# Patient Record
Sex: Male | Born: 1955 | Hispanic: No | Marital: Single | Smoking: Never smoker
Health system: Southern US, Community
[De-identification: ages and names within clinical notes are randomized; demographics above are authoritative.]

## PROBLEM LIST (undated history)

## (undated) DIAGNOSIS — F32A Depression, unspecified: Secondary | ICD-10-CM

## (undated) DIAGNOSIS — G43909 Migraine, unspecified, not intractable, without status migrainosus: Secondary | ICD-10-CM

## (undated) DIAGNOSIS — F411 Generalized anxiety disorder: Secondary | ICD-10-CM

## (undated) DIAGNOSIS — M1990 Unspecified osteoarthritis, unspecified site: Secondary | ICD-10-CM

## (undated) DIAGNOSIS — M542 Cervicalgia: Secondary | ICD-10-CM

## (undated) DIAGNOSIS — F329 Major depressive disorder, single episode, unspecified: Secondary | ICD-10-CM

## (undated) DIAGNOSIS — K219 Gastro-esophageal reflux disease without esophagitis: Secondary | ICD-10-CM

## (undated) HISTORY — DX: Generalized anxiety disorder: F41.1

## (undated) HISTORY — DX: Cervicalgia: M54.2

## (undated) HISTORY — PX: TONSILLECTOMY: SUR1361

## (undated) HISTORY — DX: Major depressive disorder, single episode, unspecified: F32.9

## (undated) HISTORY — DX: Depression, unspecified: F32.A

---

## 1998-08-02 ENCOUNTER — Other Ambulatory Visit: Admission: RE | Admit: 1998-08-02 | Discharge: 1998-08-02 | Payer: Self-pay | Admitting: Otolaryngology

## 1999-04-18 ENCOUNTER — Ambulatory Visit (HOSPITAL_COMMUNITY): Admission: RE | Admit: 1999-04-18 | Discharge: 1999-04-18 | Payer: Self-pay | Admitting: Internal Medicine

## 1999-04-18 ENCOUNTER — Encounter: Payer: Self-pay | Admitting: Internal Medicine

## 1999-08-04 ENCOUNTER — Encounter: Payer: Self-pay | Admitting: Orthopedic Surgery

## 1999-08-10 ENCOUNTER — Inpatient Hospital Stay (HOSPITAL_COMMUNITY): Admission: RE | Admit: 1999-08-10 | Discharge: 1999-08-17 | Payer: Self-pay | Admitting: Orthopedic Surgery

## 1999-08-14 ENCOUNTER — Encounter: Payer: Self-pay | Admitting: Orthopedic Surgery

## 1999-09-21 ENCOUNTER — Emergency Department (HOSPITAL_COMMUNITY): Admission: EM | Admit: 1999-09-21 | Discharge: 1999-09-21 | Payer: Self-pay | Admitting: Emergency Medicine

## 2000-05-15 HISTORY — PX: OTHER SURGICAL HISTORY: SHX169

## 2003-03-26 ENCOUNTER — Ambulatory Visit (HOSPITAL_COMMUNITY): Admission: RE | Admit: 2003-03-26 | Discharge: 2003-03-26 | Payer: Self-pay | Admitting: General Surgery

## 2003-05-25 ENCOUNTER — Inpatient Hospital Stay (HOSPITAL_COMMUNITY): Admission: RE | Admit: 2003-05-25 | Discharge: 2003-05-29 | Payer: Self-pay | Admitting: Orthopedic Surgery

## 2004-04-11 ENCOUNTER — Ambulatory Visit: Payer: Self-pay | Admitting: Internal Medicine

## 2004-09-08 ENCOUNTER — Ambulatory Visit: Payer: Self-pay | Admitting: Internal Medicine

## 2005-05-15 HISTORY — PX: OTHER SURGICAL HISTORY: SHX169

## 2005-10-18 ENCOUNTER — Ambulatory Visit: Payer: Self-pay | Admitting: Internal Medicine

## 2012-08-19 ENCOUNTER — Telehealth: Payer: Self-pay | Admitting: Internal Medicine

## 2012-08-19 NOTE — Telephone Encounter (Signed)
Ok with me 

## 2012-08-19 NOTE — Telephone Encounter (Signed)
Patient was last seen in 2007 and he has moved back to the area and wants to re-establish care, please advise

## 2012-10-01 ENCOUNTER — Ambulatory Visit: Payer: Self-pay | Admitting: Internal Medicine

## 2012-12-17 ENCOUNTER — Ambulatory Visit: Payer: Self-pay | Admitting: Internal Medicine

## 2012-12-30 ENCOUNTER — Ambulatory Visit (INDEPENDENT_AMBULATORY_CARE_PROVIDER_SITE_OTHER): Payer: Self-pay | Admitting: Internal Medicine

## 2012-12-30 ENCOUNTER — Telehealth: Payer: Self-pay | Admitting: Internal Medicine

## 2012-12-30 ENCOUNTER — Encounter: Payer: Self-pay | Admitting: Internal Medicine

## 2012-12-30 VITALS — BP 112/90 | HR 103 | Temp 97.0°F | Ht 68.0 in | Wt 183.5 lb

## 2012-12-30 DIAGNOSIS — G4700 Insomnia, unspecified: Secondary | ICD-10-CM | POA: Insufficient documentation

## 2012-12-30 DIAGNOSIS — F411 Generalized anxiety disorder: Secondary | ICD-10-CM

## 2012-12-30 DIAGNOSIS — Z0000 Encounter for general adult medical examination without abnormal findings: Secondary | ICD-10-CM | POA: Insufficient documentation

## 2012-12-30 DIAGNOSIS — Z0001 Encounter for general adult medical examination with abnormal findings: Secondary | ICD-10-CM | POA: Insufficient documentation

## 2012-12-30 HISTORY — DX: Generalized anxiety disorder: F41.1

## 2012-12-30 MED ORDER — ZOLPIDEM TARTRATE 10 MG PO TABS: 10.0000 mg | ORAL_TABLET | Freq: Every evening | ORAL | Status: DC | PRN

## 2012-12-30 MED ORDER — ALPRAZOLAM 0.5 MG PO TABS: 0.2500 mg | ORAL_TABLET | Freq: Two times a day (BID) | ORAL | Status: DC | PRN

## 2012-12-30 MED ORDER — ALPRAZOLAM 1 MG PO TABS: ORAL_TABLET | ORAL | Status: DC

## 2012-12-30 NOTE — Telephone Encounter (Signed)
The patient is hoping to come in for a new pt apt before the next available opening (mid oct).  He states he is having anxiety. He has canceled two new pt apts previously.  Let me know if you want him added in this week. Thanks!

## 2012-12-30 NOTE — Assessment & Plan Note (Signed)
For med restart,  to f/u any worsening symptoms or concerns  

## 2012-12-30 NOTE — Assessment & Plan Note (Signed)
For ambien prn re-start,  to f/u any worsening symptoms or concerns

## 2012-12-30 NOTE — Telephone Encounter (Signed)
Ok with me, even today is ok

## 2012-12-30 NOTE — Progress Notes (Signed)
  Subjective:    Patient ID: Peter [name], male    DOB: 19-Nov-1955, 57 y.o.   MRN: [id number]  HPI   Here to f/u and re-establish, will have full health benefits in 2-3 mo, does not want extensive eval today due to cost;  Will have to have a third hip replacement as the second hip hardware has been required.  Unfortunately with this he has increased stress. 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  Was well controlled by FP in [facility] [place] [facility] with anxiety with xanax/ambien prn for sleep as well, had been taking 1 mg - 1/2 bid prn. Has tried relaxing excercises, long standing problem for many years.  Bikes 50-60 miles per wk. Peak wk was 205 about 1 yr ago, now working towards 175.  Almost married again a yr ago but didn't, has a new [place].  Denies worsening depressive symptoms, suicidal ideation, or panic. Has increased difficutly with sleep as well Past Medical History  Diagnosis Date  . Anxiety state, unspecified 12/30/2012  . Depression    Past Surgical History  Procedure Laterality Date  . Right hip tha  2002  . Right hip revision  2007    reports that he has quit smoking. He does not have any smokeless tobacco history on file. He reports that he does not drink alcohol or use illicit drugs. family history includes Alcoholism in an other family member; Arthritis in an other family member; Heart disease in an other family member; Hypertension in an other family member; Lung cancer in an other family member; Stroke in an other family member. Allergies  Allergen Reactions  . Demerol [Meperidine]     hallucinations   No current outpatient prescriptions on file prior to visit.   No current facility-administered medications on file prior to visit.   Review of Systems All otherwise neg per pt     Objective:   Physical Exam BP 112/90  Pulse 103  Temp(Src) 97 F (36.1 C) (Oral)  Ht 5\' 8"  (1.727  m)  Wt 183 lb 8 oz (83.235 kg)  BMI 27.91 kg/m2  SpO2 94% VS noted,  Constitutional: Pt appears well-developed and well-nourished.  HENT: Head: NCAT.  Right Ear: External ear normal.  Left Ear: External ear normal.  Eyes: Conjunctivae and EOM are normal. Pupils are equal, round, and reactive to light.  Neck: Normal range of motion. Neck supple.  Cardiovascular: Normal rate and regular rhythm.   Pulmonary/Chest: Effort normal and breath sounds normal.  Abd:  Soft, NT, non-distended, + BS Neurological: Pt is alert. Not confused  Skin: Skin is warm. No erythema.  Psychiatric: Pt behavior is normal. Thought content normal. 1-2+ nervous, not depressed affect    Assessment & Plan:

## 2012-12-30 NOTE — Telephone Encounter (Signed)
Appt today at 2:45.

## 2012-12-30 NOTE — Patient Instructions (Signed)
Please take all new medication as prescribed Please continue all other medications as before, and refills have been done if requested.  Please return in 3 months, or sooner if needed, with Lab testing done 3-5 days before

## 2013-03-20 ENCOUNTER — Other Ambulatory Visit: Payer: Self-pay

## 2013-04-01 ENCOUNTER — Ambulatory Visit: Payer: Self-pay | Admitting: Internal Medicine

## 2013-04-02 ENCOUNTER — Ambulatory Visit: Payer: Self-pay | Admitting: Internal Medicine

## 2013-04-13 ENCOUNTER — Other Ambulatory Visit: Payer: Self-pay | Admitting: Internal Medicine

## 2013-04-15 NOTE — Telephone Encounter (Signed)
Faxed hardcopy to [facility] [place]  

## 2013-04-15 NOTE — Telephone Encounter (Signed)
Done hardcopy to [facility]  

## 2013-05-21 ENCOUNTER — Ambulatory Visit: Payer: Self-pay | Admitting: Internal Medicine

## 2013-07-02 ENCOUNTER — Other Ambulatory Visit: Payer: Self-pay | Admitting: Internal Medicine

## 2013-07-02 ENCOUNTER — Ambulatory Visit: Payer: Self-pay | Admitting: Internal Medicine

## 2013-07-02 NOTE — Telephone Encounter (Signed)
Done hardcopy to [facility]  

## 2013-07-02 NOTE — Telephone Encounter (Signed)
Faxed hardcopy to [place] [place]

## 2013-07-03 ENCOUNTER — Ambulatory Visit: Payer: Self-pay | Admitting: Internal Medicine

## 2013-07-11 ENCOUNTER — Ambulatory Visit: Payer: Self-pay | Admitting: Internal Medicine

## 2013-07-16 ENCOUNTER — Emergency Department (HOSPITAL_COMMUNITY): Payer: Managed Care, Other (non HMO)

## 2013-07-16 ENCOUNTER — Emergency Department (HOSPITAL_COMMUNITY)
Admission: EM | Admit: 2013-07-16 | Discharge: 2013-07-16 | Disposition: A | Discharge status: Home / Self Care | Payer: Managed Care, Other (non HMO) | Attending: Emergency Medicine | Admitting: Emergency Medicine

## 2013-07-16 ENCOUNTER — Encounter (HOSPITAL_COMMUNITY): Payer: Self-pay | Admitting: Emergency Medicine

## 2013-07-16 DIAGNOSIS — Z87891 Personal history of nicotine dependence: Secondary | ICD-10-CM | POA: Insufficient documentation

## 2013-07-16 DIAGNOSIS — W010XXA Fall on same level from slipping, tripping and stumbling without subsequent striking against object, initial encounter: Secondary | ICD-10-CM | POA: Insufficient documentation

## 2013-07-16 DIAGNOSIS — M25552 Pain in left hip: Secondary | ICD-10-CM

## 2013-07-16 DIAGNOSIS — F411 Generalized anxiety disorder: Secondary | ICD-10-CM | POA: Insufficient documentation

## 2013-07-16 DIAGNOSIS — Y939 Activity, unspecified: Secondary | ICD-10-CM | POA: Insufficient documentation

## 2013-07-16 DIAGNOSIS — R0789 Other chest pain: Secondary | ICD-10-CM | POA: Insufficient documentation

## 2013-07-16 DIAGNOSIS — R079 Chest pain, unspecified: Secondary | ICD-10-CM

## 2013-07-16 DIAGNOSIS — Z79899 Other long term (current) drug therapy: Secondary | ICD-10-CM | POA: Insufficient documentation

## 2013-07-16 DIAGNOSIS — F3289 Other specified depressive episodes: Secondary | ICD-10-CM | POA: Insufficient documentation

## 2013-07-16 DIAGNOSIS — Z96649 Presence of unspecified artificial hip joint: Secondary | ICD-10-CM | POA: Insufficient documentation

## 2013-07-16 DIAGNOSIS — F329 Major depressive disorder, single episode, unspecified: Secondary | ICD-10-CM | POA: Insufficient documentation

## 2013-07-16 DIAGNOSIS — Z9889 Other specified postprocedural states: Secondary | ICD-10-CM | POA: Insufficient documentation

## 2013-07-16 DIAGNOSIS — F419 Anxiety disorder, unspecified: Secondary | ICD-10-CM

## 2013-07-16 DIAGNOSIS — Y929 Unspecified place or not applicable: Secondary | ICD-10-CM | POA: Insufficient documentation

## 2013-07-16 DIAGNOSIS — R112 Nausea with vomiting, unspecified: Secondary | ICD-10-CM | POA: Insufficient documentation

## 2013-07-16 LAB — URINALYSIS, ROUTINE W REFLEX MICROSCOPIC
BILIRUBIN URINE: NEGATIVE
GLUCOSE, UA: NEGATIVE mg/dL
HGB URINE DIPSTICK: NEGATIVE
KETONES UR: NEGATIVE mg/dL
LEUKOCYTES UA: NEGATIVE
Nitrite: NEGATIVE
PH: 7.5 (ref 5.0–8.0)
Protein, ur: NEGATIVE mg/dL
Specific Gravity, Urine: 1.014 (ref 1.005–1.030)
Urobilinogen, UA: 0.2 mg/dL (ref 0.0–1.0)

## 2013-07-16 LAB — CBC WITH DIFFERENTIAL/PLATELET
BASOS PCT: 1 % (ref 0–1)
Basophils Absolute: 0 10*3/uL (ref 0.0–0.1)
EOS ABS: 0.1 10*3/uL (ref 0.0–0.7)
Eosinophils Relative: 1 % (ref 0–5)
HCT: 37.7 % — ABNORMAL LOW (ref 39.0–52.0)
HEMOGLOBIN: 12.6 g/dL — AB (ref 13.0–17.0)
Lymphocytes Relative: 21 % (ref 12–46)
Lymphs Abs: 1.5 10*3/uL (ref 0.7–4.0)
MCH: 28.8 pg (ref 26.0–34.0)
MCHC: 33.4 g/dL (ref 30.0–36.0)
MCV: 86.3 fL (ref 78.0–100.0)
Monocytes Absolute: 0.8 10*3/uL (ref 0.1–1.0)
Monocytes Relative: 11 % (ref 3–12)
NEUTROS ABS: 4.8 10*3/uL (ref 1.7–7.7)
NEUTROS PCT: 66 % (ref 43–77)
PLATELETS: 264 10*3/uL (ref 150–400)
RBC: 4.37 MIL/uL (ref 4.22–5.81)
RDW: 15.9 % — ABNORMAL HIGH (ref 11.5–15.5)
WBC: 7.2 10*3/uL (ref 4.0–10.5)

## 2013-07-16 LAB — COMPREHENSIVE METABOLIC PANEL
ALBUMIN: 3.7 g/dL (ref 3.5–5.2)
ALK PHOS: 83 U/L (ref 39–117)
ALT: 18 U/L (ref 0–53)
AST: 21 U/L (ref 0–37)
BUN: 18 mg/dL (ref 6–23)
CHLORIDE: 103 meq/L (ref 96–112)
CO2: 23 mEq/L (ref 19–32)
Calcium: 9.1 mg/dL (ref 8.4–10.5)
Creatinine, Ser: 0.78 mg/dL (ref 0.50–1.35)
GFR calc Af Amer: >90 mL/min (ref >90)
GFR calc non Af Amer: >90 mL/min (ref >90)
Glucose, Bld: 94 mg/dL (ref 70–99)
POTASSIUM: 4.5 meq/L (ref 3.7–5.3)
SODIUM: 140 meq/L (ref 137–147)
TOTAL PROTEIN: 6.7 g/dL (ref 6.0–8.3)

## 2013-07-16 LAB — I-STAT CHEM 8, ED
BUN: 18 mg/dL (ref 6–23)
Calcium, Ion: 1.24 mmol/L — ABNORMAL HIGH (ref 1.12–1.23)
Chloride: 103 mEq/L (ref 96–112)
Creatinine, Ser: 1 mg/dL (ref 0.50–1.35)
Glucose, Bld: 89 mg/dL (ref 70–99)
HCT: 38 % — ABNORMAL LOW (ref 39.0–52.0)
Hemoglobin: 12.9 g/dL — ABNORMAL LOW (ref 13.0–17.0)
POTASSIUM: 4.3 meq/L (ref 3.7–5.3)
SODIUM: 140 meq/L (ref 137–147)
TCO2: 25 mmol/L (ref 0–100)

## 2013-07-16 LAB — POC OCCULT BLOOD, ED: Fecal Occult Bld: NEGATIVE

## 2013-07-16 LAB — I-STAT TROPONIN, ED: TROPONIN I, POC: 0.01 ng/mL (ref 0.00–0.08)

## 2013-07-16 MED ORDER — METHOCARBAMOL 500 MG PO TABS: ORAL_TABLET | ORAL | Status: DC

## 2013-07-16 MED ORDER — OMEPRAZOLE 20 MG PO CPDR: DELAYED_RELEASE_CAPSULE | ORAL | Status: DC

## 2013-07-16 MED ORDER — HYDROCODONE-ACETAMINOPHEN 5-325 MG PO TABS
1.0000 | ORAL_TABLET | Freq: Once | ORAL | Status: AC
  Administered 2013-07-16: 1 via ORAL
  Filled 2013-07-16: qty 1

## 2013-07-16 MED ORDER — METHOCARBAMOL 500 MG PO TABS
1000.0000 mg | ORAL_TABLET | Freq: Once | ORAL | Status: AC
  Administered 2013-07-16: 1000 mg via ORAL
  Filled 2013-07-16: qty 2

## 2013-07-16 MED ORDER — HYDROCODONE-ACETAMINOPHEN 5-325 MG PO TABS: ORAL_TABLET | ORAL | Status: DC

## 2013-07-16 NOTE — ED Notes (Signed)
Pt started having chest pain radiating down left arm with N/V. No diaphoresis. Chest pain 10/10 intermittent for 2 days- heaviness on chest. Pt thinks he may have had anxiety attack from hip pain. Pt had R hip replacement 10 years ago- pt slipped on ice 4 days ago and has had terrible pain in hip since (pain rating 7/10). CP now 5/10. EMS gave 325mg  ASA, 2 Nitro, 6 mg morphine. Pt states he has been taking 6-7 tablets of naproxen for several days. 136/96, HR 78, Resp 18, 02 99% on RA. EKG showed SR

## 2013-07-16 NOTE — Discharge Instructions (Signed)
Try ice/ heat to your hip. Take the medications as prescribed. DO NOT TAKE ANY MORE THAN ALEVE 2 TABLETS TWICE A DAY! Start the prilosec to prevent a stomach ulcer from developing.  Keep your appointment tomorrow with Dr Jenny Reichmann. Call Dr Damita Dunnings office to have him recheck your hip replacement soon. Return to the ED if you start vomiting blood, have black bowel movements or you start having abdominal pain.

## 2013-07-16 NOTE — ED Provider Notes (Signed)
CSN: [id number]     Arrival date & time 07/16/13  30 History   First MD Initiated Contact with Patient 07/16/13 1052     Chief Complaint  Patient presents with  . Chest Pain     (Consider location/radiation/quality/duration/timing/severity/associated sxs/prior Treatment) HPI Patient reports the emergency room. He states this morning he felt lightheaded. After he got to work he started having a chest heaviness which he has had off and on for the past few days. He said he had a lightening bolt type pain in his left petrous muscle that went down his left arm. He states 4 days ago he slipped and he has hurt his right hip. He states his original hip replacement was in 2001 and he had a revision in 2005. However they're revision was one of the recalled hips. 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 He states sometimes when he moves his leg a certain way he feels like the hip joint is slipping. He states he has been having worsening pain since she slipped and he has been taking Naprosyn 2 tablets up to 6 times a day. He states his left hip as a "10" and pain. He states he feels tired and fatigued. He does not have abdominal pain. He states his bowel movements are not black. He had nausea with vomiting twice without blood. EMS gave patient 325 mg aspirin, 2 nitroglycerin, and 6 mg of morphine. He states his chest pain that went from a 10 to a 6, his hip went from a 10-7.  PCP Dr Jenny Reichmann, has an appointment tomorrow Orthopedist Dr Mayer Camel  Past Medical History  Diagnosis Date  . Anxiety state, unspecified 12/30/2012  . Depression    Past Surgical History  Procedure Laterality Date  . Right hip tha  2002  . Right hip revision  2007   Family History  Problem Relation Age of Onset  . Alcoholism    . Arthritis    . Lung cancer    . Heart disease    . Stroke    . Hypertension     History  Substance Use Topics  . Smoking status: Former Research scientist (life sciences)  . Smokeless tobacco: Not on file  . Alcohol  Use: Yes  employed new job for 3 months  Review of Systems  All other systems reviewed and are negative.      Allergies  Demerol  Home Medications   Current Outpatient Rx  Name  Route  Sig  Dispense  Refill  . ALPRAZolam (XANAX) 1 MG tablet   Oral   Take 0.5 mg by mouth at bedtime as needed for anxiety.         . naproxen sodium (ANAPROX) 220 MG tablet   Oral   Take 440 mg by mouth daily as needed (for pain).         Marland Kitchen zolpidem (AMBIEN) 10 MG tablet   Oral   Take 5 mg by mouth daily as needed for sleep.          BP 117/90  Pulse 70  Resp 19  SpO2 95%  Vital signs normal   Physical Exam  Nursing note and vitals reviewed. Constitutional: He is oriented to person, place, and time. He appears well-developed and well-nourished.  Non-toxic appearance. He does not appear ill. No distress.  HENT:  Head: Normocephalic and atraumatic.  Right Ear: External ear normal.  Left Ear: External ear normal.  Nose: Nose normal. No mucosal edema or rhinorrhea.  Mouth/Throat: Oropharynx  is clear and moist and mucous membranes are normal. No dental abscesses or uvula swelling.  Eyes: Conjunctivae and EOM are normal. Pupils are equal, round, and reactive to light.  Neck: Normal range of motion and full passive range of motion without pain. Neck supple.  Cardiovascular: Normal rate, regular rhythm and normal heart sounds.  Exam reveals no gallop and no friction rub.   No murmur heard. Pulmonary/Chest: Effort normal and breath sounds normal. No respiratory distress. He has no wheezes. He has no rhonchi. He has no rales. He exhibits no tenderness and no crepitus.  Abdominal: Soft. Normal appearance and bowel sounds are normal. He exhibits no distension. There is no tenderness. There is no rebound and no guarding.  Musculoskeletal: Normal range of motion. He exhibits no edema and no tenderness.  Moves all extremities well with pain in his left hip joint on ROM  Neurological: He is  alert and oriented to person, place, and time. He has normal strength. No cranial nerve deficit.  Skin: Skin is warm, dry and intact. No rash noted. No erythema. No pallor.  Psychiatric: His speech is normal and behavior is normal. His mood appears anxious.    ED Course  Procedures (including critical care time)  Medications  methocarbamol (ROBAXIN) tablet 1,000 mg (1,000 mg Oral Given 07/16/13 1521)  HYDROcodone-acetaminophen (NORCO/VICODIN) 5-325 MG per tablet 1 tablet (1 tablet Oral Given 07/16/13 1521)   Patient's brother is in the ER. He states the patient has a lot of anxiety. Patient does admit he has anxiety because he is contemplating a third hip replacement. He also has started a new job.  Discussed with patient about not taking so much aleve.He is at risk for renal failure or ulcer/GI bleeding.    Labs Review Results for orders placed during the hospital encounter of 07/16/13  CBC WITH DIFFERENTIAL      Result Value Ref Range   WBC 7.2  4.0 - 10.5 K/uL   RBC 4.37  4.22 - 5.81 MIL/uL   Hemoglobin 12.6 (*) 13.0 - 17.0 g/dL   HCT 37.7 (*) 39.0 - 52.0 %   MCV 86.3  78.0 - 100.0 fL   MCH 28.8  26.0 - 34.0 pg   MCHC 33.4  30.0 - 36.0 g/dL   RDW 15.9 (*) 11.5 - 15.5 %   Platelets 264  150 - 400 K/uL   Neutrophils Relative % 66  43 - 77 %   Neutro Abs 4.8  1.7 - 7.7 K/uL   Lymphocytes Relative 21  12 - 46 %   Lymphs Abs 1.5  0.7 - 4.0 K/uL   Monocytes Relative 11  3 - 12 %   Monocytes Absolute 0.8  0.1 - 1.0 K/uL   Eosinophils Relative 1  0 - 5 %   Eosinophils Absolute 0.1  0.0 - 0.7 K/uL   Basophils Relative 1  0 - 1 %   Basophils Absolute 0.0  0.0 - 0.1 K/uL  COMPREHENSIVE METABOLIC PANEL      Result Value Ref Range   Sodium 140  137 - 147 mEq/L   Potassium 4.5  3.7 - 5.3 mEq/L   Chloride 103  96 - 112 mEq/L   CO2 23  19 - 32 mEq/L   Glucose, Bld 94  70 - 99 mg/dL   BUN 18  6 - 23 mg/dL   Creatinine, Ser 0.78  0.50 - 1.35 mg/dL   Calcium 9.1  8.4 - 10.5 mg/dL    Total Protein  6.7  6.0 - 8.3 g/dL   Albumin 3.7  3.5 - 5.2 g/dL   AST 21  0 - 37 U/L   ALT 18  0 - 53 U/L   Alkaline Phosphatase 83  39 - 117 U/L   Total Bilirubin <0.2 (*) 0.3 - 1.2 mg/dL   GFR calc non Af Amer >90  >90 mL/min   GFR calc Af Amer >90  >90 mL/min  URINALYSIS, ROUTINE W REFLEX MICROSCOPIC      Result Value Ref Range   Color, Urine YELLOW  YELLOW   APPearance CLEAR  CLEAR   Specific Gravity, Urine 1.014  1.005 - 1.030   pH 7.5  5.0 - 8.0   Glucose, UA NEGATIVE  NEGATIVE mg/dL   Hgb urine dipstick NEGATIVE  NEGATIVE   Bilirubin Urine NEGATIVE  NEGATIVE   Ketones, ur NEGATIVE  NEGATIVE mg/dL   Protein, ur NEGATIVE  NEGATIVE mg/dL   Urobilinogen, UA 0.2  0.0 - 1.0 mg/dL   Nitrite NEGATIVE  NEGATIVE   Leukocytes, UA NEGATIVE  NEGATIVE  I-STAT TROPOININ, ED      Result Value Ref Range   Troponin i, poc 0.01  0.00 - 0.08 ng/mL   Comment 3           POC OCCULT BLOOD, ED      Result Value Ref Range   Fecal Occult Bld NEGATIVE  NEGATIVE  I-STAT CHEM 8, ED      Result Value Ref Range   Sodium 140  137 - 147 mEq/L   Potassium 4.3  3.7 - 5.3 mEq/L   Chloride 103  96 - 112 mEq/L   BUN 18  6 - 23 mg/dL   Creatinine, Ser 1.00  0.50 - 1.35 mg/dL   Glucose, Bld 89  70 - 99 mg/dL   Calcium, Ion 1.24 (*) 1.12 - 1.23 mmol/L   TCO2 25  0 - 100 mmol/L   Hemoglobin 12.9 (*) 13.0 - 17.0 g/dL   HCT 38.0 (*) 39.0 - 52.0 %    Laboratory interpretation all normal except mild anemia    Imaging Review Dg Chest 2 View  07/16/2013   CLINICAL DATA:  Chest pain post fall  EXAM: CHEST  2 VIEW  COMPARISON:  None.  FINDINGS: Cardiomediastinal silhouette is unremarkable. No acute infiltrate or pleural effusion. No pulmonary edema. Mild degenerative changes thoracic spine. Mild thoracic dextroscoliosis.  IMPRESSION: No active cardiopulmonary disease.   Electronically Signed   By: Lahoma Crocker M.D.   On: 07/16/2013 12:12   Dg Hip Complete Right  07/16/2013   CLINICAL DATA:  Fall on ice,  worsening pain  EXAM: RIGHT HIP - COMPLETE 2+ VIEW  COMPARISON:  None.  FINDINGS: Three views of the right hip submitted. No acute fracture or subluxation. There is right hip prosthesis in anatomic alignment. No evidence of prosthesis loosening.  IMPRESSION: No acute fracture or subluxation. Right hip prosthesis in anatomic alignment. No evidence of prosthesis loosening.   Electronically Signed   By: Lahoma Crocker M.D.   On: 07/16/2013 12:10     EKG Interpretation   Date/Time:  Wednesday July 16 2013 10:57:24 EST Ventricular Rate:  70 PR Interval:  135 QRS Duration: 79 QT Interval:  364 QTC Calculation: 393 R Axis:   44 Text Interpretation:  Sinus rhythm Normal ECG No significant change since  last tracing 19 May 2003 Confirmed by [facility]  MD-I, Alexzandrea Normington ([id number]) on 07/16/2013  11:03:48 AM      MDM  Final diagnoses:  Hip pain, left  Chest pain  Anxiety  Nausea and vomiting    New Prescriptions   HYDROCODONE-ACETAMINOPHEN (NORCO/VICODIN) 5-325 MG PER TABLET    Take 1 or 2 po Q 6hrs for pain   METHOCARBAMOL (ROBAXIN) 500 MG TABLET    Take 1 or 2 po Q 6hrs for muscle soreness   OMEPRAZOLE (PRILOSEC) 20 MG CAPSULE    Take 1 po BID x 2 weeks then once a day    Plan discharge   Rolland Porter, MD, Abr[facility] Sander   Janice Norrie, MD 07/16/13 [phone]

## 2013-07-16 NOTE — ED Notes (Signed)
Patient has been taking naproxen tablets 6-7 tablets a day.

## 2013-07-17 ENCOUNTER — Ambulatory Visit (INDEPENDENT_AMBULATORY_CARE_PROVIDER_SITE_OTHER): Payer: Managed Care, Other (non HMO) | Admitting: Internal Medicine

## 2013-07-17 ENCOUNTER — Encounter: Payer: Self-pay | Admitting: Internal Medicine

## 2013-07-17 VITALS — BP 132/90 | HR 89 | Temp 98.0°F | Ht 68.0 in | Wt 186.2 lb

## 2013-07-17 DIAGNOSIS — M25551 Pain in right hip: Secondary | ICD-10-CM

## 2013-07-17 DIAGNOSIS — M25559 Pain in unspecified hip: Secondary | ICD-10-CM

## 2013-07-17 DIAGNOSIS — Z0000 Encounter for general adult medical examination without abnormal findings: Secondary | ICD-10-CM

## 2013-07-17 DIAGNOSIS — F341 Dysthymic disorder: Secondary | ICD-10-CM

## 2013-07-17 DIAGNOSIS — F418 Other specified anxiety disorders: Secondary | ICD-10-CM | POA: Insufficient documentation

## 2013-07-17 DIAGNOSIS — G8929 Other chronic pain: Secondary | ICD-10-CM | POA: Insufficient documentation

## 2013-07-17 LAB — I-STAT TROPONIN, ED: Troponin i, poc: 0 ng/mL (ref 0.00–0.08)

## 2013-07-17 MED ORDER — ESCITALOPRAM OXALATE 10 MG PO TABS: 10.0000 mg | ORAL_TABLET | Freq: Every day | ORAL | Status: DC

## 2013-07-17 MED ORDER — TRAMADOL HCL (ER BIPHASIC) 200 MG PO CP24: 1.0000 | ORAL_CAPSULE | Freq: Every day | ORAL | Status: DC | PRN

## 2013-07-17 MED ORDER — CLONAZEPAM 1 MG PO TABS: 1.0000 mg | ORAL_TABLET | Freq: Two times a day (BID) | ORAL | Status: DC | PRN

## 2013-07-17 NOTE — Assessment & Plan Note (Signed)
With acute flare, finish hydrocodone, but gave more longer term tramadol ER, and to see Dr Mayer Camel later today, consider pain management

## 2013-07-17 NOTE — Progress Notes (Signed)
Pre visit review using our clinic review tool, if applicable. No additional management support is needed unless otherwise documented below in the visit note. 

## 2013-07-17 NOTE — Assessment & Plan Note (Addendum)
With major depression with assoc anxiety - for lexapro 10 qd, stop xanax, change to klonopin, refer psychiatry, gave note for work, doubt bipolar, more likely reactive due to ongoing pain

## 2013-07-17 NOTE — Progress Notes (Signed)
Subjective:    Patient ID: Peter [name], male    DOB: August 01, 1955, 58 y.o.   MRN: [id number]  HPI  Here to f/u after seen in ER, pain and anxiety quite elevated recently, working and has benefits/ins now so wants to pursue further tx if needed, pursuing disability.  States has elev cobalt levels related to hip prosthesis in place, has worsening anxiety and depression, has tried anti-depressants in the past, had to try several, not clear last one that helped;  Has missed several days at work, used up all his PTO time.  Has occas panic attacks, appetite some decreased, uses marijuana with the xanax to self medicate for anxiety and insomnia., Has chronic pain to right hip baseline about 5-7/10, worse to abduct the right hip laterally, worried he may need another hip surgury eventually, worried about which anti-depressant he might need to be on, has has some suicidal thoughts, no plan and no intention, has been more withdrawn, panic about every 1-2 wks.  Not seeing psychiatry, seen in ER yesterday with similar symptoms, work asked him to come here to consider time off short term to get together, willing to see psychiatry and nerves and depression now overwhelming.  States not serious about suicide as he could not do that to his family.  Ambien helps some with sleep as well.  No ETOH, other drugs or IVDA.   Has good insight in that his panic is etiology for occas chest fullness and sob.  Does not want to lose job, but last 2 mo have been very bad, normally an outgoing person., just tired of it all.  Denies anger or difficult to get along with others. Tearful in office today  Sees Dr Mayer Camel for right hip, not seen though since 2005, did see an ortho in [facility] a couple of times in the meantime when he lived there. Has been naproxen frequently up to 5-6 per day. Did have some improvement with tx in ER, took one hydrocodone for pain last PM, muscle relaxer may have helped.  OK for colonoscopy now that he has  insurance.  Declines tetanus and flu shots.  Pt denies CP, worsening SOB, DOE, wheezing, orthopnea, PND, worsening LE edema, palpitations, dizziness or syncope.  Pt denies neurological change such as new headache, facial or extremity weakness.  Pt denies polydipsia, polyuria,Pt states overall good compliance with treatment and medications, good tolerability, and has been trying to follow lower cholesterol diet  Pt states good ability with ADL's, has low fall risk, home safety reviewed and adequate, no other significant changes in hearing or vision Past Medical History  Diagnosis Date  . Anxiety state, unspecified 12/30/2012  . Depression    Past Surgical History  Procedure Laterality Date  . Right hip tha  2002  . Right hip revision  2007    reports that he has quit smoking. He does not have any smokeless tobacco history on file. He reports that he drinks alcohol. He reports that he does not use illicit drugs. family history includes Alcoholism in an other family member; Arthritis in an other family member; Heart disease in an other family member; Hypertension in an other family member; Lung cancer in an other family member; Stroke in an other family member. Allergies  Allergen Reactions  . Demerol [Meperidine]     hallucinations   Current Outpatient Prescriptions on File Prior to Visit  Medication Sig Dispense Refill  . ALPRAZolam (XANAX) 1 MG tablet Take 0.5 mg by mouth  at bedtime as needed for anxiety.      Marland Kitchen HYDROcodone-acetaminophen (NORCO/VICODIN) 5-325 MG per tablet Take 1 or 2 po Q 6hrs for pain  10 tablet  0  . methocarbamol (ROBAXIN) 500 MG tablet Take 1 or 2 po Q 6hrs for muscle soreness  80 tablet  0  . naproxen sodium (ANAPROX) 220 MG tablet Take 440 mg by mouth daily as needed (for pain).      Marland Kitchen omeprazole (PRILOSEC) 20 MG capsule Take 1 po BID x 2 weeks then once a day  60 capsule  0  . zolpidem (AMBIEN) 10 MG tablet Take 5 mg by mouth daily as needed for sleep.       No  current facility-administered medications on file prior to visit.      Past Medical History  Diagnosis Date  . Anxiety state, unspecified 12/30/2012  . Depression    Past Surgical History  Procedure Laterality Date  . Right hip tha  2002  . Right hip revision  2007    reports that he has quit smoking. He does not have any smokeless tobacco history on file. He reports that he drinks alcohol. He reports that he does not use illicit drugs. Current Outpatient Prescriptions on File Prior to Visit  Medication Sig Dispense Refill  . ALPRAZolam (XANAX) 1 MG tablet Take 0.5 mg by mouth at bedtime as needed for anxiety.      Marland Kitchen HYDROcodone-acetaminophen (NORCO/VICODIN) 5-325 MG per tablet Take 1 or 2 po Q 6hrs for pain  10 tablet  0  . methocarbamol (ROBAXIN) 500 MG tablet Take 1 or 2 po Q 6hrs for muscle soreness  80 tablet  0  . naproxen sodium (ANAPROX) 220 MG tablet Take 440 mg by mouth daily as needed (for pain).      Marland Kitchen omeprazole (PRILOSEC) 20 MG capsule Take 1 po BID x 2 weeks then once a day  60 capsule  0  . zolpidem (AMBIEN) 10 MG tablet Take 5 mg by mouth daily as needed for sleep.       No current facility-administered medications on file prior to visit.    Review of Systems  Constitutional: Negative for unexpected weight change, or unusual diaphoresis  HENT: Negative for tinnitus.   Eyes: Negative for photophobia and visual disturbance.  Respiratory: Negative for choking and stridor.   Gastrointestinal: Negative for vomiting and blood in stool.  Genitourinary: Negative for hematuria and decreased urine volume.  Musculoskeletal: Negative for acute joint swelling Skin: Negative for color change and wound.  Neurological: Negative for tremors and numbness other than noted  Psychiatric/Behavioral: Negative for decreased concentration or  hyperactivity.       Objective:   Physical Exam BP 132/90  Pulse 89  Temp(Src) 98 F (36.7 C) (Oral)  Ht 5\' 8"  (1.727 m)  Wt 186 lb 4 oz  (84.482 kg)  BMI 28.33 kg/m2  SpO2 93%  Constitutional: Negative for unexpected weight change, or unusual diaphoresis  HENT: Negative for tinnitus.   Eyes: Negative for photophobia and visual disturbance.  Respiratory: Negative for choking and stridor.   Gastrointestinal: Negative for vomiting and blood in stool.  Genitourinary: Negative for hematuria and decreased urine volume.  Musculoskeletal: Negative for acute joint swelling, but marked antalgic gait favoring the right hip, tender right lumbar paravertebral, upper right buttock and lateral hip area wihtout swelling or redness, or rash Skin: Negative for color change and wound.  Neurological: Negative for tremors and numbness other than noted  Psychiatric/Behavioral: Negative for decreased concentration,  but  Hyperactivity +, pressure speech and 3+ nervous      Assessment & Plan:

## 2013-07-17 NOTE — Assessment & Plan Note (Signed)

## 2013-07-17 NOTE — Patient Instructions (Addendum)
Ok to stop the naproxen as you have been taking too much, and OK to stop the xanax  Please take all new medication as prescribed - the tramadol ER for pain, cont the robaxin for muscle relaxer as given in the ER  You will be contacted regarding the referral for: Dr Rowan/orthopedic, and then consider pain management if persistent pain after this exam and treatment if pain persists  Please take all new medication as prescribed - the lexapro 10 mg per day, and the klonopin, and ok for 3 weeks out of work (short term), also to be referred to psychiatry (Please see [facility] before leaving today)  Please go to ER (at any time) or [facility] during regular business hours for any worsening thoughts of hurting yourself  Please continue all other medications as before, and refills have been done if requested. Please have the pharmacy call with any other refills you may need.  Please go to the LAB in the Basement (turn left off the elevator) for the tests to be done today  You will be contacted by phone if any changes need to be made immediately.  Otherwise, you will receive a letter about your results with an explanation, but please check with MyChart first.  You will be contacted regarding the referral for: colonoscopy  Please return in 6 months, or sooner if needed

## 2013-07-18 ENCOUNTER — Encounter: Payer: Self-pay | Admitting: Internal Medicine

## 2013-07-30 ENCOUNTER — Telehealth: Payer: Self-pay | Admitting: Internal Medicine

## 2013-07-30 NOTE — Telephone Encounter (Signed)
All of the anti-depressants take up to 3-4 wks for full effect, I would continue as is for now, unless he really wants to try change to another such as zoloft  I can also refer for counseling, or psychiatry if he wants

## 2013-07-30 NOTE — Telephone Encounter (Signed)
Patient states that the anti-depressant he is taking is making him more depressed. He is back to feeling down and wanting to feel isolated. He is calling for recommendations. Please advise.

## 2013-07-31 NOTE — Telephone Encounter (Signed)
Called the patient informed of MD instructions on medication.  The patient stated that after calling yesterday, late last night and today he is feeling much better, more like himself.  States he will continue what he is doing and evidently is now taking effect.  He did state to inform PCP thank you for all his help and stated he Dr. Jenny Reichmann is a great MD!.Marland Kitchen

## 2013-08-01 ENCOUNTER — Telehealth: Payer: Self-pay

## 2013-08-01 DIAGNOSIS — Z0279 Encounter for issue of other medical certificate: Secondary | ICD-10-CM

## 2013-08-01 NOTE — Telephone Encounter (Signed)
Completed letter as instructed and called the patient to pickup at convenience at the front desk.

## 2013-08-01 NOTE — Telephone Encounter (Signed)
Oh, ok that is ok - robin to help please

## 2013-08-01 NOTE — Telephone Encounter (Signed)
I called patient back. He states his HR needs a release letter that he is able to return back to work on 08/12/13. This can not be done??

## 2013-08-01 NOTE — Telephone Encounter (Signed)
Very sorry, but unable to do that long of work note

## 2013-08-01 NOTE — Telephone Encounter (Signed)
Phone call from patient [phone] stating he would like a return to work note to return on 08/12/13. Please advise.

## 2013-08-25 ENCOUNTER — Other Ambulatory Visit: Payer: Self-pay | Admitting: Internal Medicine

## 2013-08-25 ENCOUNTER — Encounter: Payer: Self-pay | Admitting: Internal Medicine

## 2013-09-09 ENCOUNTER — Telehealth: Payer: Self-pay | Admitting: Internal Medicine

## 2013-09-09 ENCOUNTER — Ambulatory Visit (HOSPITAL_COMMUNITY)
Admission: RE | Admit: 2013-09-09 | Discharge: 2013-09-09 | Disposition: A | Discharge status: Home / Self Care | Payer: Managed Care, Other (non HMO) | Attending: Psychiatry | Admitting: Psychiatry

## 2013-09-09 NOTE — Telephone Encounter (Signed)
Patient called to let Dr. Jenny Reichmann know that he is over at the [facility] now trying to admit himself. He has been having suicidal thoughts over the past couple of weeks due to his depression. No call back requested.

## 2013-09-09 NOTE — BH Assessment (Signed)
[place] Assessment Progress Note      Received a call from Peter [name], indicating that he had spoken to his therapist and plans to present to the Emergency Room to voluntarily admit himself tomorrow, 09/10/13.  He reports he's concerned that these thoughts are coming from his medication as they've been worsening over the last week and he does not want to act on those thoughts, but is finding them to be more persistent.    He reports his sister, Peter [name], is coming to his house tonight to prepare dinner and stay with him and that she will bring him to the emergency room in the morning.  He reported that his sister had actually pulled up while he was on the phone with this Probation officer and that she was actually in her car talking on the phone right that minute, but planned to be with him to keep him safe.  He reported that he had a lot to live for, including his grandchildren, but was concerned about the thoughts and wanted to get his medications straightened out as soon as possible.    This writer attempted to confirm this information with the patient's sister, but was not able to connect with her directly.  After four attempts, this writer left a message indicating the plan the patient reported and requested a call back if this was not correct.

## 2013-09-09 NOTE — BH Assessment (Signed)
[facility] Assessment Progress Note      Mr Eyal Greenhaw presented to [facility] for an assessment at the recommendation of his therapist, Edilia Bo.  This Probation officer was notified that he may do so by the previous counselor, Latrelle Dodrill, who spoke with the individual and notified him that there were no beds at [facility], so she suggested that he go to the Emergency Room at [facility] where he could be held for safety, but he stated he did not want to do that.  He checked in at the lobby at [facility] at 15:20 pm.  This writer was notified at 16:30 that he was in the lobby and tired of waiting.  This Probation officer did not previously know that there were any individuals awaiting assessment as this notification is usually given by the registration MHT after registration has been completed and the registration MHT had pts presenting for admission who were ahead of this individual.  This writer immediately went to see the patient and was notified by the registration tech that he had just been notified by the receptionist that he had decided to leave because he was tired of waiting.    This Probation officer contacted 911 immediately and requested a well check.  I was notified that this would take place within 2 hours.  I received a call back at from [facility] at dispatch at 1800 indicating that Officer Brazenski had gone to the individual's house and there was no answer.  There were also no cars parked in either of the spaces assigned to the residence.    This Probation officer then contacted the patient's therapist's confidential voicemail to notify her of the situation should she want to petition the patient for involuntary commitment.  (Her voice mail indicated that she would return the call within 24 hours and did not have an emergency number other than the ER or 911.)  This Probation officer then staffed the situation with Catalina Pizza, NP, who indicated that it was appropriate to notify his emergency contact who is his sister, Florene Glen.  This Probation officer left  a message on his sister's voicemail requesting she follow up with this office or bring her brother to the emergency room for his safety.

## 2013-09-09 NOTE — Telephone Encounter (Signed)
Patient states that the clonazePAM [facility]) is making him feel really anxious. He wants to know if he can go back to taking Xanax. He will be out of his clonazePAM (KLONOPIN) in 2-3 days. Please advise.

## 2013-09-09 NOTE — BH Assessment (Signed)
[place] Assessment Progress Note      This Probation officer spoke with the patient's sister who assures that she did make contact with her brother and denies that she is staying with him this evening.  She reports that she just left his house.  This Probation officer asked her if she was concerned for his safety overnight and she reported, "not right now or I wouldn't have left him."  This Probation officer confirmed that there are no firearms in the house and his sister reported that the patient's roommate would be home with him this evening and that Mr Carton intended to wake up and shower and then go to the emergency room.  She reported her brother said, "I don't really want to kill myself, but I do feel like that would be the easiest way.  You're telling me what to do and you've never steered me wrong, so I'm going to listen to you and go to the emergency room."

## 2013-09-09 NOTE — Telephone Encounter (Signed)
Due to his depression, pt should seek psychiatry care at this time, as per his other note where he stated he is seeking behavioral health eval; if pt is hosp'd we should hold on further xanax or similar at this time

## 2013-09-10 NOTE — Telephone Encounter (Signed)
Called left message to call back 

## 2013-09-10 NOTE — Telephone Encounter (Signed)
The patient did call back and left a detailed message that he is trying to make decision where to admit himself either Behavioral Health or ER. Still having deep depression, sadness constantly and even thoughts of suicide.  Did see his therapist yesterday and he agreed to get additonal help. Wanted PCP to be aware and no need to call back as will most likely be at either the [facility] or [facility].

## 2013-09-11 ENCOUNTER — Other Ambulatory Visit: Payer: Self-pay | Admitting: Internal Medicine

## 2013-09-11 NOTE — Telephone Encounter (Signed)
i dont feel comfortable with refills of the klonopin or the xanax as pt is seeing psychiatry, should be treated with medications per 1 doctor

## 2013-09-12 ENCOUNTER — Encounter (HOSPITAL_COMMUNITY): Payer: Self-pay | Admitting: Emergency Medicine

## 2013-09-12 ENCOUNTER — Emergency Department (HOSPITAL_COMMUNITY)
Admission: EM | Admit: 2013-09-12 | Discharge: 2013-09-12 | Disposition: A | Discharge status: Home / Self Care | Payer: Managed Care, Other (non HMO) | Attending: Emergency Medicine | Admitting: Emergency Medicine

## 2013-09-12 DIAGNOSIS — F332 Major depressive disorder, recurrent severe without psychotic features: Secondary | ICD-10-CM

## 2013-09-12 DIAGNOSIS — Z87891 Personal history of nicotine dependence: Secondary | ICD-10-CM | POA: Insufficient documentation

## 2013-09-12 DIAGNOSIS — F418 Other specified anxiety disorders: Secondary | ICD-10-CM

## 2013-09-12 DIAGNOSIS — F411 Generalized anxiety disorder: Secondary | ICD-10-CM

## 2013-09-12 DIAGNOSIS — F1994 Other psychoactive substance use, unspecified with psychoactive substance-induced mood disorder: Secondary | ICD-10-CM

## 2013-09-12 DIAGNOSIS — Z79899 Other long term (current) drug therapy: Secondary | ICD-10-CM | POA: Insufficient documentation

## 2013-09-12 DIAGNOSIS — F341 Dysthymic disorder: Secondary | ICD-10-CM | POA: Insufficient documentation

## 2013-09-12 DIAGNOSIS — R45851 Suicidal ideations: Secondary | ICD-10-CM | POA: Insufficient documentation

## 2013-09-12 LAB — CBC
HCT: 42.8 % (ref 39.0–52.0)
HEMOGLOBIN: 14.6 g/dL (ref 13.0–17.0)
MCH: 29.6 pg (ref 26.0–34.0)
MCHC: 34.1 g/dL (ref 30.0–36.0)
MCV: 86.6 fL (ref 78.0–100.0)
PLATELETS: 263 10*3/uL (ref 150–400)
RBC: 4.94 MIL/uL (ref 4.22–5.81)
RDW: 16.4 % — ABNORMAL HIGH (ref 11.5–15.5)
WBC: 7.1 10*3/uL (ref 4.0–10.5)

## 2013-09-12 LAB — COMPREHENSIVE METABOLIC PANEL
ALT: 17 U/L (ref 0–53)
AST: 21 U/L (ref 0–37)
Albumin: 4.1 g/dL (ref 3.5–5.2)
Alkaline Phosphatase: 99 U/L (ref 39–117)
BUN: 12 mg/dL (ref 6–23)
CALCIUM: 9.6 mg/dL (ref 8.4–10.5)
CO2: 24 mEq/L (ref 19–32)
Chloride: 100 mEq/L (ref 96–112)
Creatinine, Ser: 1.01 mg/dL (ref 0.50–1.35)
GFR, EST NON AFRICAN AMERICAN: 81 mL/min — AB (ref >90)
GLUCOSE: 124 mg/dL — AB (ref 70–99)
Potassium: 3.8 mEq/L (ref 3.7–5.3)
Sodium: 139 mEq/L (ref 137–147)
Total Bilirubin: 0.3 mg/dL (ref 0.3–1.2)
Total Protein: 7.2 g/dL (ref 6.0–8.3)

## 2013-09-12 LAB — RAPID URINE DRUG SCREEN, HOSP PERFORMED
Amphetamines: NOT DETECTED
Barbiturates: NOT DETECTED
Benzodiazepines: POSITIVE — AB
Cocaine: POSITIVE — AB
OPIATES: NOT DETECTED
Tetrahydrocannabinol: POSITIVE — AB

## 2013-09-12 LAB — ACETAMINOPHEN LEVEL: Acetaminophen (Tylenol), Serum: <15 ug/mL (ref 10–30)

## 2013-09-12 LAB — ETHANOL

## 2013-09-12 LAB — SALICYLATE LEVEL

## 2013-09-12 MED ORDER — LORAZEPAM 1 MG PO TABS: 1.0000 mg | ORAL_TABLET | Freq: Three times a day (TID) | ORAL | Status: DC | PRN

## 2013-09-12 MED ORDER — NICOTINE 21 MG/24HR TD PT24: 21.0000 mg | MEDICATED_PATCH | Freq: Every day | TRANSDERMAL | Status: DC

## 2013-09-12 MED ORDER — ALUM & MAG HYDROXIDE-SIMETH 200-200-20 MG/5ML PO SUSP
30.0000 mL | ORAL | Status: DC | PRN
  Administered 2013-09-12: 30 mL via ORAL
  Filled 2013-09-12: qty 30

## 2013-09-12 MED ORDER — ACETAMINOPHEN 325 MG PO TABS: 650.0000 mg | ORAL_TABLET | ORAL | Status: DC | PRN

## 2013-09-12 MED ORDER — PANTOPRAZOLE SODIUM 40 MG PO TBEC
40.0000 mg | DELAYED_RELEASE_TABLET | Freq: Every day | ORAL | Status: DC
Start: 2013-09-12 — End: 2013-09-12
  Administered 2013-09-12: 40 mg via ORAL
  Filled 2013-09-12: qty 1

## 2013-09-12 MED ORDER — METHOCARBAMOL 500 MG PO TABS
500.0000 mg | ORAL_TABLET | Freq: Four times a day (QID) | ORAL | Status: DC | PRN
  Administered 2013-09-12: 500 mg via ORAL
  Filled 2013-09-12: qty 1

## 2013-09-12 MED ORDER — CLONAZEPAM 1 MG PO TABS
1.0000 mg | ORAL_TABLET | Freq: Two times a day (BID) | ORAL | Status: DC | PRN
  Administered 2013-09-12 (×2): 1 mg via ORAL
  Filled 2013-09-12 (×2): qty 1

## 2013-09-12 MED ORDER — ZOLPIDEM TARTRATE 5 MG PO TABS
5.0000 mg | ORAL_TABLET | Freq: Every day | ORAL | Status: DC | PRN
  Administered 2013-09-12: 5 mg via ORAL
  Filled 2013-09-12: qty 1

## 2013-09-12 MED ORDER — ESCITALOPRAM OXALATE 10 MG PO TABS
10.0000 mg | ORAL_TABLET | Freq: Every day | ORAL | Status: DC
  Administered 2013-09-12: 10 mg via ORAL
  Filled 2013-09-12: qty 1

## 2013-09-12 MED ORDER — IBUPROFEN 200 MG PO TABS
600.0000 mg | ORAL_TABLET | Freq: Three times a day (TID) | ORAL | Status: DC | PRN
  Administered 2013-09-12: 600 mg via ORAL
  Filled 2013-09-12: qty 3

## 2013-09-12 MED ORDER — ONDANSETRON HCL 4 MG PO TABS: 4.0000 mg | ORAL_TABLET | Freq: Three times a day (TID) | ORAL | Status: DC | PRN

## 2013-09-12 MED ORDER — ZOLPIDEM TARTRATE 5 MG PO TABS: 5.0000 mg | ORAL_TABLET | Freq: Every evening | ORAL | Status: DC | PRN

## 2013-09-12 NOTE — Progress Notes (Signed)
Pt accepted to [facility] pending bed availability.  Pt referred to [facility] pending review.  Pt referred to [facility] pending review.   Noreene Larsson [phone]  ED CSW 09/12/2013 1039am

## 2013-09-12 NOTE — ED Provider Notes (Signed)
CSN: [id number]     Arrival date & time 09/12/13  0119 History   First MD Initiated Contact with Patient 09/12/13 0154     Chief Complaint  Patient presents with  . Medical Clearance     (Consider location/radiation/quality/duration/timing/severity/associated sxs/prior Treatment) HPI Comments: Patient is a 58 year old male with a past medical history of anxiety and depression who presents with suicidal ideations over the past few days. Patient reports struggling with depression for the past 2 years since his mother died. The depression has only spiraled downward with losing his job, ending an engagement, and losing his 2 best friends. Patient has thoughts of wanting to end his life. He reports doing this by cutting his carotid artery or femoral artery and "bleeding out." Patient denies any homicidal ideations. Patient reports using cocaine and drinking wine the other night, hoping he would not wake up. Patient's therapist recommended he come to the ED for evaluation.    Past Medical History  Diagnosis Date  . Anxiety state, unspecified 12/30/2012  . Depression    Past Surgical History  Procedure Laterality Date  . Right hip tha  2002  . Right hip revision  2007  . Tumor removed     Family History  Problem Relation Age of Onset  . Alcoholism    . Arthritis    . Lung cancer    . Heart disease    . Stroke    . Hypertension     History  Substance Use Topics  . Smoking status: Former Research scientist (life sciences)  . Smokeless tobacco: Not on file  . Alcohol Use: Yes     Comment: lately, wine    Review of Systems  Constitutional: Negative for fever, chills and fatigue.  HENT: Negative for trouble swallowing.   Eyes: Negative for visual disturbance.  Respiratory: Negative for shortness of breath.   Cardiovascular: Negative for chest pain and palpitations.  Gastrointestinal: Negative for nausea, vomiting, abdominal pain and diarrhea.  Genitourinary: Negative for dysuria and difficulty urinating.   Musculoskeletal: Negative for arthralgias and neck pain.  Skin: Negative for color change.  Neurological: Negative for dizziness and weakness.  Psychiatric/Behavioral: Positive for suicidal ideas and dysphoric mood.      Allergies  Demerol and Lunesta  Home Medications   Prior to Admission medications   Medication Sig Start Date End Date Taking? Authorizing Provider  clonazePAM (KLONOPIN) 1 MG tablet Take 1 tablet (1 mg total) by mouth 2 (two) times daily as needed for anxiety. 07/17/13   Biagio Borg, MD  escitalopram (LEXAPRO) 10 MG tablet Take 1 tablet (10 mg total) by mouth daily. 07/17/13 10/15/13  Biagio Borg, MD  methocarbamol (ROBAXIN) 500 MG tablet Take 1 or 2 po Q 6hrs for muscle soreness 07/16/13   Janice Norrie, MD  omeprazole (PRILOSEC) 20 MG capsule TAKE ONE CAPSULE BY MOUTH TWICE A DAY FOR 2 WEEKS, THEN ONCE DAILY THEREAFTER 08/25/13   Biagio Borg, MD  TraMADol HCl 200 MG CP24 Take 1 tablet by mouth daily as needed. 07/17/13   Biagio Borg, MD  zolpidem (AMBIEN) 10 MG tablet Take 5 mg by mouth daily as needed for sleep.    Historical Provider, MD   BP 142/96  Pulse 91  Temp(Src) 98.6 F (37 C) (Oral)  Resp 16  SpO2 96% Physical Exam  Nursing note and vitals reviewed. Constitutional: He is oriented to person, place, and time. He appears well-developed and well-nourished. No distress.  HENT:  Head: Normocephalic  and atraumatic.  Eyes: Conjunctivae and EOM are normal.  Neck: Normal range of motion.  Cardiovascular: Normal rate and regular rhythm.  Exam reveals no gallop and no friction rub.   No murmur heard. Pulmonary/Chest: Effort normal and breath sounds normal. He has no wheezes. He has no rales. He exhibits no tenderness.  Abdominal: Soft. He exhibits no distension. There is no tenderness. There is no rebound.  Musculoskeletal: Normal range of motion.  Neurological: He is alert and oriented to person, place, and time. Coordination normal.  Speech is goal-oriented.  Moves limbs without ataxia.   Skin: Skin is warm and dry.  Psychiatric:  Patient is tearful. Dysphoric mood.     ED Course  Procedures (including critical care time) Labs Review Labs Reviewed  CBC - Abnormal; Notable for the following:    RDW 16.4 (*)    All other components within normal limits  COMPREHENSIVE METABOLIC PANEL - Abnormal; Notable for the following:    Glucose, Bld 124 (*)    GFR calc non Af Amer 81 (*)    All other components within normal limits  SALICYLATE LEVEL - Abnormal; Notable for the following:    Salicylate Lvl <4.4 (*)    All other components within normal limits  URINE RAPID DRUG SCREEN (HOSP PERFORMED) - Abnormal; Notable for the following:    Cocaine POSITIVE (*)    Benzodiazepines POSITIVE (*)    Tetrahydrocannabinol POSITIVE (*)    All other components within normal limits  ACETAMINOPHEN LEVEL  ETHANOL    Imaging Review No results found.   EKG Interpretation None      MDM   Final diagnoses:  Depression with anxiety  Anxiety state, unspecified    2:41 AM Patient will be moved to the Psych ED for further evaluation. Vitals stable and patient afebrile.    Alvina Chou, PA-C 09/12/13 2255

## 2013-09-12 NOTE — BH Assessment (Signed)
Assessment Note  Peter [name] is an 58 y.o. male who presents to the ED with worsening depression with SI with plan to cut femoral artery, hang self, if he had a gun he would shoot self. Pt reports that he has not left his house in 2 weeks except for once. Patient reports that he just keeps thinking about all the reasons of why he would be better off dead. Pt reports he lost his mother, aunt, 2 best friends in the past 3 years, and he recently had a break up with his girlfriend of 7 years. Patient reprots he also took a pay cut for his new job, at [employer] and so he has felt burdened and staying at home more. 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 Patient reports he is up all night and sleeps all day. Patient currently using Short term disability benefits from work. Patient reprots that he has attempted to get help however not made it past the waiting rooms due to severe anxiety. Patient unable to contract for safety. Pt reprots his thearpist also felt patietn needed inpatient treatment. Pt reports he takes xanax, and klonopin, and an antidepressant. Pt PCP recently refilled his lexapro, however would not agree to continue klonopin. Pt states he has a lot of concerns on anti depressant because he feels that the side effects can make you feel more depressed and is wondering if that is what he is experiencing. Pt reports his sister is most supportive, as he reports his brothers are more of the "man up nature". Pt reports he feels lonely because his children have their own families too and he doesn't want to interfere with their lives or feel as a burden.    Axis I: Major Depression, Recurrent severe Axis II: Deferred Axis III:  Past Medical History  Diagnosis Date  . Anxiety state, unspecified 12/30/2012  . Depression    Axis IV: other psychosocial or environmental problems,  problems related to social environment and problems with primary support group Axis V: 11-20 some danger of hurting self or others possible OR occasionally fails to maintain minimal personal hygiene OR gross impairment in communication  Past Medical History:  Past Medical History  Diagnosis Date  . Anxiety state, unspecified 12/30/2012  . Depression     Past Surgical History  Procedure Laterality Date  . Right hip tha  2002  . Right hip revision  2007  . Tumor removed      Family History:  Family History  Problem Relation Age of Onset  . Alcoholism    . Arthritis    . Lung cancer    . Heart disease    . Stroke    . Hypertension      Social History:  reports that he has quit smoking. He does not have any smokeless tobacco history on file. He reports that he drinks alcohol. He reports that he does not use illicit drugs.  Additional Social History:     CIWA: CIWA-Ar BP: 110/69 mmHg Pulse Rate: 64 COWS:    Allergies:  Allergies  Allergen Reactions  . Demerol [Meperidine]     hallucinations  . Lunesta [Eszopiclone]     Home Medications:  (Not in a hospital admission)  OB/GYN Status:  No LMP for male patient.  General Assessment Data Is this a Tele or Face-to-Face Assessment?: Face-to-Face Is this  an Initial Assessment or a Re-assessment for this encounter?: Initial Assessment Living Arrangements: Other (Comment) (room mate, however room mate only there 1 night per week ) Can pt return to current living arrangement?: Yes Admission Status: Voluntary Is patient capable of signing voluntary admission?: Yes Transfer from: Home Referral Source: Self/Family/Friend     [facility] Living Arrangements: Other (Comment) (room mate, however room mate only there 1 night per week ) Name of Psychiatrist: none Name of Therapist: Jolinda Croak   Education Status Is patient currently in school?: No  Risk to self Suicidal Ideation: Yes-Currently Present Suicidal  Intent: Yes-Currently Present Is patient at risk for suicide?: Yes Suicidal Plan?: Yes-Currently Present Specify Current Suicidal Plan: multiple plans, cutting femoral artery, took out knife and tried to, thought of hanging, od on cocaine, in fron of car, or gun if he had  Access to Means: Yes Specify Access to Suicidal Means: access to knives  What has been your use of drugs/alcohol within the last 12 months?: alcohol and cocaine 3 nights ago, before no other use  Previous Attempts/Gestures: No How many times?: 0 Other Self Harm Risks: no Triggers for Past Attempts: None known Intentional Self Injurious Behavior: None Family Suicide History: No Recent stressful life event(s): Loss (Comment) (death of mother, best friends, aunt and end of 36yr gf ) Persecutory voices/beliefs?: No Depression: Yes Depression Symptoms: Despondent;Tearfulness;Isolating;Fatigue;Guilt;Loss of interest in usual pleasures;Feeling worthless/self pity Substance abuse history and/or treatment for substance abuse?: No Suicide prevention information given to non-admitted patients: Not applicable  Risk to Others Homicidal Ideation: No Thoughts of Harm to Others: No Current Homicidal Intent: No Current Homicidal Plan: No Access to Homicidal Means: No Identified Victim: n/a  History of harm to others?: No Assessment of Violence: None Noted Violent Behavior Description: none Does patient have access to weapons?: Yes (Comment) (knives ) Criminal Charges Pending?: No Does patient have a court date: No  Psychosis Hallucinations: Auditory ("do it do it" ) Delusions: None noted  Mental Status Report Appear/Hygiene: Disheveled Eye Contact: Good Motor Activity: Freedom of movement;Restlessness Speech: Logical/coherent;Soft Level of Consciousness: Quiet/awake Mood: Depressed;Anxious Affect: Appropriate to circumstance Anxiety Level: Moderate Thought Processes: Coherent;Relevant Judgement:  Unimpaired Orientation: Person;Place;Time;Situation Obsessive Compulsive Thoughts/Behaviors: None  Cognitive Functioning Concentration: Normal Memory: Recent Intact;Remote Intact IQ: Average Insight: Fair Impulse Control: Fair Appetite: Fair Sleep: Increased Total Hours of Sleep: 12 Vegetative Symptoms: Staying in bed;Decreased grooming  ADLScreening [facility] Assessment Services) Patient's cognitive ability adequate to safely complete daily activities?: Yes Patient able to express need for assistance with ADLs?: Yes Independently performs ADLs?: Yes (appropriate for developmental age)  Prior Inpatient Therapy Prior Inpatient Therapy: No  Prior Outpatient Therapy Prior Outpatient Therapy: Yes Prior Therapy Dates: ongong Prior Therapy Facilty/Provider(s): [facility] Reason for Treatment: Depression and anxiety   ADL Screening (condition at time of admission) Patient's cognitive ability adequate to safely complete daily activities?: Yes Patient able to express need for assistance with ADLs?: Yes Independently performs ADLs?: Yes (appropriate for developmental age)         Values / Beliefs Cultural Requests During Hospitalization: None Spiritual Requests During Hospitalization: None        Additional Information 1:1 In Past 12 Months?: No CIRT Risk: No Elopement Risk: No Does patient have medical clearance?: Yes     Disposition:  Disposition Initial Assessment Completed for this Encounter: Yes Disposition of Patient: Inpatient treatment program  On Site Evaluation by:   Reviewed with Physician:    Edson Snowball 09/12/2013  9:30 AM

## 2013-09-12 NOTE — ED Notes (Signed)
Patient denies SI, HI, AVH at present. Tearful, restless, anxious. Reports on going passive SI with plans including drug OD and slicing of femoral artery. Reports that SI also coming from [facility] to self harm stating, "voices in my head seem to be increasing". States that he feels sad and alone. States that "If I had a gun, I'd be gone". Thoughts of my grandchildren and kids have stopped me..." Patient reports that his sister is supportive and that he has a therapist Iva Boop, both of whom he wants to contact. Rates pain 7.5/10 from right hip replacements. Anxiety 9/10. Depression 10/10.  Encouragement offered. Given Klonopin, Motrin, Robaxin, Ambien.  Safety maintained, Q 15 checks in place.

## 2013-09-12 NOTE — Progress Notes (Signed)
Pt accepted to [facility] by Dr. Dareen Piano. Rn can call report to [phone]. Patient to be transported voluntarily by [facility].   Noreene Larsson [phone]  ED CSW 09/12/2013 1054am

## 2013-09-12 NOTE — Telephone Encounter (Signed)
Called the patient left a detailed message of MD instructions.

## 2013-09-12 NOTE — Consult Note (Signed)
[facility] Face-to-Face Psychiatry Consult   Reason for Consult:  Depression. Referring Physician:  ED Physician  Peter [name] is an 58 y.o. male. Total Time spent with patient: 45 minutes  Assessment: AXIS I:  Major Depression, Recurrent severe and Substance Induced Mood Disorder AXIS II:  Deferred AXIS III:   Past Medical History  Diagnosis Date  . Anxiety state, unspecified 12/30/2012  . Depression    AXIS IV:  housing problems, problems related to social environment and problems with primary support group AXIS V:  41-50 serious symptoms  Plan:  Recommend psychiatric Inpatient admission when medically cleared.  Subjective:   Peter [name] is a 58 y.o. male patient admitted with depression and having suicidal thoughts.  HPI:  Peter [name] is an 58 y.o. male who presents to the ED with worsening depression with SI with plan to cut femoral artery, hang self, if he had a gun he would shoot self. Pt reports that he has not left his house in 2 weeks except for once. Patient reports that he just keeps thinking about all the reasons of why he would be better off dead. Pt reports he lost his mother, aunt, 2 best friends in the past 3 years, and he recently had a break up with his girlfriend of 7 years. Patient reprots he also took a pay cut for his new job, at [employer] and so he has felt burdened and staying at home more. 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 Patient reports he is up all night and sleeps all day. Patient currently using Short term disability benefits from work. Patient reprots that he has attempted to get help however not made it past the waiting rooms due to severe anxiety. Patient unable to contract for safety. Pt reprots his thearpist also felt patietn needed inpatient treatment. Pt reports he takes xanax, and klonopin, and an antidepressant. Pt PCP  recently refilled his lexapro, however would not agree to continue klonopin. Pt states he has a lot of concerns on anti depressant because he feels that the side effects can make you feel more depressed and is wondering if that is what he is experiencing. Pt reports his sister is most supportive, as he reports his brothers are more of the "man up nature". Pt reports he feels lonely because his children have their own families too and he doesn't want to interfere with their lives or feel as a burden. On evaluation today still endorses above and having suicidal thoughts. Feels cannot move on with life without help. Withdrawn, amotivated and feeling hopeless.  HPI Elements:   Location:  depression. Quality:  severe. Severity:  recurrent.  Past Psychiatric History: Past Medical History  Diagnosis Date  . Anxiety state, unspecified 12/30/2012  . Depression     reports that he has quit smoking. He does not have any smokeless tobacco history on file. He reports that he drinks alcohol. He reports that he does not use illicit drugs. Family History  Problem Relation Age of Onset  . Alcoholism    . Arthritis    . Lung cancer    . Heart disease    . Stroke    . Hypertension     Family History Substance Abuse: No Family Supports: Yes, List: (pt sister Karena Addison, brothers not supportive or able to connect wi) Living Arrangements: Other (Comment) (room mate,  however room mate only there 1 night per week ) Can pt return to current living arrangement?: Yes   Allergies:   Allergies  Allergen Reactions  . Demerol [Meperidine]     hallucinations  . Lunesta [Eszopiclone]     ACT Assessment Complete:  Yes:    Educational Status    Risk to Self: Risk to self Suicidal Ideation: Yes-Currently Present Suicidal Intent: Yes-Currently Present Is patient at risk for suicide?: Yes Suicidal Plan?: Yes-Currently Present Specify Current Suicidal Plan: multiple plans, cutting femoral artery, took out knife and  tried to, thought of hanging, od on cocaine, in fron of car, or gun if he had  Access to Means: Yes Specify Access to Suicidal Means: access to knives  What has been your use of drugs/alcohol within the last 12 months?: alcohol and cocaine 3 nights ago, before no other use  Previous Attempts/Gestures: No How many times?: 0 Other Self Harm Risks: no Triggers for Past Attempts: None known Intentional Self Injurious Behavior: None Family Suicide History: No Recent stressful life event(s): Loss (Comment) (death of mother, best friends, aunt and end of 51yrgf ) Persecutory voices/beliefs?: No Depression: Yes Depression Symptoms: Despondent;Tearfulness;Isolating;Fatigue;Guilt;Loss of interest in usual pleasures;Feeling worthless/self pity Substance abuse history and/or treatment for substance abuse?: No Suicide prevention information given to non-admitted patients: Not applicable  Risk to Others: Risk to Others Homicidal Ideation: No Thoughts of Harm to Others: No Current Homicidal Intent: No Current Homicidal Plan: No Access to Homicidal Means: No Identified Victim: n/a  History of harm to others?: No Assessment of Violence: None Noted Violent Behavior Description: none Does patient have access to weapons?: Yes (Comment) (knives ) Criminal Charges Pending?: No Does patient have a court date: No  Abuse:    Prior Inpatient Therapy: Prior Inpatient Therapy Prior Inpatient Therapy: No  Prior Outpatient Therapy: Prior Outpatient Therapy Prior Outpatient Therapy: Yes Prior Therapy Dates: ongong Prior Therapy Facilty/Provider(s): [facility] EllisReason for Treatment: Depression and anxiety   Additional Information: Additional Information 1:1 In Past 12 Months?: No CIRT Risk: No Elopement Risk: No Does patient have medical clearance?: Yes                  Objective: Blood pressure 110/69, pulse 64, temperature 98.1 F (36.7 C), temperature source Oral, resp. rate 18, SpO2  99.00%.There is no weight on file to calculate BMI. Results for orders placed during the hospital encounter of 09/12/13 (from the past 72 hour(s))  ACETAMINOPHEN LEVEL     Status: None   Collection Time    09/12/13  1:46 AM      Result Value Ref Range   Acetaminophen (Tylenol), Serum <15.0  10 - 30 ug/mL   Comment:            THERAPEUTIC CONCENTRATIONS VARY     SIGNIFICANTLY. A RANGE OF 10-30     ug/mL MAY BE AN EFFECTIVE     CONCENTRATION FOR MANY PATIENTS.     HOWEVER, SOME ARE BEST TREATED     AT CONCENTRATIONS OUTSIDE THIS     RANGE.     ACETAMINOPHEN CONCENTRATIONS     >150 ug/mL AT 4 HOURS AFTER     INGESTION AND >50 ug/mL AT 12     HOURS AFTER INGESTION ARE     OFTEN ASSOCIATED WITH TOXIC     REACTIONS.  CBC     Status: Abnormal   Collection Time    09/12/13  1:46 AM      Result Value  Ref Range   WBC 7.1  4.0 - 10.5 K/uL   RBC 4.94  4.22 - 5.81 MIL/uL   Hemoglobin 14.6  13.0 - 17.0 g/dL   HCT 42.8  39.0 - 52.0 %   MCV 86.6  78.0 - 100.0 fL   MCH 29.6  26.0 - 34.0 pg   MCHC 34.1  30.0 - 36.0 g/dL   RDW 16.4 (*) 11.5 - 15.5 %   Platelets 263  150 - 400 K/uL  COMPREHENSIVE METABOLIC PANEL     Status: Abnormal   Collection Time    09/12/13  1:46 AM      Result Value Ref Range   Sodium 139  137 - 147 mEq/L   Potassium 3.8  3.7 - 5.3 mEq/L   Chloride 100  96 - 112 mEq/L   CO2 24  19 - 32 mEq/L   Glucose, Bld 124 (*) 70 - 99 mg/dL   BUN 12  6 - 23 mg/dL   Creatinine, Ser 1.01  0.50 - 1.35 mg/dL   Calcium 9.6  8.4 - 10.5 mg/dL   Total Protein 7.2  6.0 - 8.3 g/dL   Albumin 4.1  3.5 - 5.2 g/dL   AST 21  0 - 37 U/L   ALT 17  0 - 53 U/L   Alkaline Phosphatase 99  39 - 117 U/L   Total Bilirubin 0.3  0.3 - 1.2 mg/dL   GFR calc non Af Amer 81 (*) >90 mL/min   GFR calc Af Amer >90  >90 mL/min   Comment: (NOTE)     The eGFR has been calculated using the CKD EPI equation.     This calculation has not been validated in all clinical situations.     eGFR's persistently <90  mL/min signify possible Chronic Kidney     Disease.  ETHANOL     Status: None   Collection Time    09/12/13  1:46 AM      Result Value Ref Range   Alcohol, Ethyl (B) <11  0 - 11 mg/dL   Comment:            LOWEST DETECTABLE LIMIT FOR     SERUM ALCOHOL IS 11 mg/dL     FOR MEDICAL PURPOSES ONLY  SALICYLATE LEVEL     Status: Abnormal   Collection Time    09/12/13  1:46 AM      Result Value Ref Range   Salicylate Lvl <6.0 (*) 2.8 - 20.0 mg/dL  URINE RAPID DRUG SCREEN (HOSP PERFORMED)     Status: Abnormal   Collection Time    09/12/13  2:41 AM      Result Value Ref Range   Opiates NONE DETECTED  NONE DETECTED   Cocaine POSITIVE (*) NONE DETECTED   Benzodiazepines POSITIVE (*) NONE DETECTED   Amphetamines NONE DETECTED  NONE DETECTED   Tetrahydrocannabinol POSITIVE (*) NONE DETECTED   Barbiturates NONE DETECTED  NONE DETECTED   Comment:            DRUG SCREEN FOR MEDICAL PURPOSES     ONLY.  IF CONFIRMATION IS NEEDED     FOR ANY PURPOSE, NOTIFY LAB     WITHIN 5 DAYS.                LOWEST DETECTABLE LIMITS     FOR URINE DRUG SCREEN     Drug Class       Cutoff (ng/mL)     Amphetamine  1000     Barbiturate      200     Benzodiazepine   725     Tricyclics       366     Opiates          300     Cocaine          300     THC              50   Labs are reviewed and are pertinent for cocaine and benzodiazepine.  Current Facility-Administered Medications  Medication Dose Route Frequency Provider Last Rate Last Dose  . acetaminophen (TYLENOL) tablet 650 mg  650 mg Oral Q4H PRN Alvina Chou, PA-C      . alum & mag hydroxide-simeth (MAALOX/MYLANTA) 200-200-20 MG/5ML suspension 30 mL  30 mL Oral PRN Alvina Chou, PA-C      . clonazePAM (KLONOPIN) tablet 1 mg  1 mg Oral BID PRN Alvina Chou, PA-C   1 mg at 09/12/13 0307  . escitalopram (LEXAPRO) tablet 10 mg  10 mg Oral Daily Kaitlyn Szekalski, PA-C   10 mg at 09/12/13 1036  . ibuprofen (ADVIL,MOTRIN) tablet 600 mg   600 mg Oral Q8H PRN Alvina Chou, PA-C   600 mg at 09/12/13 0307  . LORazepam (ATIVAN) tablet 1 mg  1 mg Oral Q8H PRN Alvina Chou, PA-C      . methocarbamol (ROBAXIN) tablet 500 mg  500 mg Oral Q6H PRN Alvina Chou, PA-C   500 mg at 09/12/13 0307  . nicotine (NICODERM CQ - dosed in mg/24 hours) patch 21 mg  21 mg Transdermal Daily Kaitlyn Szekalski, PA-C      . ondansetron (ZOFRAN) tablet 4 mg  4 mg Oral Q8H PRN Kaitlyn Szekalski, PA-C      . pantoprazole (PROTONIX) EC tablet 40 mg  40 mg Oral Daily [employer], PA-C   40 mg at 09/12/13 1036  . zolpidem (AMBIEN) tablet 5 mg  5 mg Oral Daily PRN Alvina Chou, PA-C   5 mg at 09/12/13 0307   Current Outpatient Prescriptions  Medication Sig Dispense Refill  . clonazePAM (KLONOPIN) 1 MG tablet Take 1 tablet (1 mg total) by mouth 2 (two) times daily as needed for anxiety.  60 tablet  1  . escitalopram (LEXAPRO) 10 MG tablet Take 1 tablet (10 mg total) by mouth daily.  90 tablet  3  . omeprazole (PRILOSEC) 20 MG capsule Take 20 mg by mouth daily as needed. For acid reflux      . traMADol (ULTRAM-ER) 200 MG 24 hr tablet Take 200 mg by mouth daily as needed for pain.      Marland Kitchen zolpidem (AMBIEN) 10 MG tablet Take 5 mg by mouth daily as needed for sleep.        Psychiatric Specialty Exam:     Blood pressure 110/69, pulse 64, temperature 98.1 F (36.7 C), temperature source Oral, resp. rate 18, SpO2 99.00%.There is no weight on file to calculate BMI.  General Appearance: Guarded  Eye Contact::  Fair  Speech:  Slow  Volume:  Increased  Mood:  Dysphoric  Affect:  Congruent  Thought Process:  Coherent  Orientation:  Full (Time, Place, and Person)  Thought Content:  Rumination  Suicidal Thoughts:  Yes.  without intent/plan  Homicidal Thoughts:  No  Memory:  Recent;   Fair  Judgement:  Impaired  Insight:  Shallow  Psychomotor Activity:  Decreased  Concentration:  Fair  Recall:  Fair  Fund of Knowledge:Fair  Language:  Fair  Akathisia:  Negative  Handed:  Right  AIMS (if indicated):     Assets:  Desire for Improvement Leisure Time  Sleep:      Musculoskeletal: Strength & Muscle Tone: within normal limits Gait & Station: normal Patient leans: Front  Treatment Plan Summary: Daily contact with patient to assess and evaluate symptoms and progress in treatment Medication management Admit to unit for stabilization. consider augmentation of anti-depressants.  Merian Capron MD 09/12/2013 10:45 AM

## 2013-09-12 NOTE — ED Notes (Addendum)
Pt. In new scrubs. Pt. And belongings searched and wanded by security.pt. Has 1 belongings bag and blue/yellow nautica bag. Pt. Has cell phone, wallet(no money), keys, pant, shoes, green shirt,underpants and socks. Pt. Has clothing, medications, toiletries in nautica bag. Pt. Belongings locked up at the nurses station in triage.

## 2013-09-12 NOTE — ED Notes (Signed)
Pt states he has a lot of anxiety that has led to a lot of depression  Pt states the depression has led to suicidal thoughts  Pt states he has a plan to sever his femoral artery  Pt is tearful in triage  Pt states in the past two years he has lost him mother, fiancee, and two best friends and his job  Pt states he does have a new job now

## 2013-09-13 NOTE — ED Provider Notes (Signed)
Medical screening examination/treatment/procedure(s) were performed by non-physician practitioner and as supervising physician I was immediately available for consultation/collaboration.   EKG Interpretation None        Hoy Morn, MD 09/13/13 [phone]

## 2013-09-23 ENCOUNTER — Emergency Department (HOSPITAL_COMMUNITY)
Admission: EM | Admit: 2013-09-23 | Discharge: 2013-09-23 | Disposition: A | Discharge status: Home / Self Care | Payer: Managed Care, Other (non HMO) | Attending: Emergency Medicine | Admitting: Emergency Medicine

## 2013-09-23 ENCOUNTER — Emergency Department (HOSPITAL_COMMUNITY): Payer: Managed Care, Other (non HMO)

## 2013-09-23 ENCOUNTER — Encounter (HOSPITAL_COMMUNITY): Payer: Self-pay | Admitting: Emergency Medicine

## 2013-09-23 DIAGNOSIS — F329 Major depressive disorder, single episode, unspecified: Secondary | ICD-10-CM | POA: Insufficient documentation

## 2013-09-23 DIAGNOSIS — Z87891 Personal history of nicotine dependence: Secondary | ICD-10-CM | POA: Insufficient documentation

## 2013-09-23 DIAGNOSIS — R079 Chest pain, unspecified: Secondary | ICD-10-CM

## 2013-09-23 DIAGNOSIS — F411 Generalized anxiety disorder: Secondary | ICD-10-CM | POA: Insufficient documentation

## 2013-09-23 DIAGNOSIS — F3289 Other specified depressive episodes: Secondary | ICD-10-CM | POA: Insufficient documentation

## 2013-09-23 DIAGNOSIS — Z79899 Other long term (current) drug therapy: Secondary | ICD-10-CM | POA: Insufficient documentation

## 2013-09-23 LAB — BASIC METABOLIC PANEL
BUN: 19 mg/dL (ref 6–23)
CO2: 24 meq/L (ref 19–32)
Calcium: 8.9 mg/dL (ref 8.4–10.5)
Chloride: 99 mEq/L (ref 96–112)
Creatinine, Ser: 1.07 mg/dL (ref 0.50–1.35)
GFR calc Af Amer: 88 mL/min — ABNORMAL LOW (ref >90)
GFR, EST NON AFRICAN AMERICAN: 76 mL/min — AB (ref >90)
GLUCOSE: 97 mg/dL (ref 70–99)
POTASSIUM: 3.9 meq/L (ref 3.7–5.3)
Sodium: 138 mEq/L (ref 137–147)

## 2013-09-23 LAB — RAPID URINE DRUG SCREEN, HOSP PERFORMED
Amphetamines: NOT DETECTED
BARBITURATES: NOT DETECTED
Benzodiazepines: POSITIVE — AB
COCAINE: POSITIVE — AB
Opiates: NOT DETECTED
Tetrahydrocannabinol: NOT DETECTED

## 2013-09-23 LAB — ETHANOL

## 2013-09-23 LAB — TROPONIN I
Troponin I: <0.3 ng/mL (ref <0.30)
Troponin I: <0.3 ng/mL (ref <0.30)

## 2013-09-23 LAB — CBC WITH DIFFERENTIAL/PLATELET
Basophils Absolute: 0.1 10*3/uL (ref 0.0–0.1)
Basophils Relative: 1 % (ref 0–1)
EOS PCT: 2 % (ref 0–5)
Eosinophils Absolute: 0.2 10*3/uL (ref 0.0–0.7)
HCT: 41.3 % (ref 39.0–52.0)
HEMOGLOBIN: 14.6 g/dL (ref 13.0–17.0)
LYMPHS ABS: 2 10*3/uL (ref 0.7–4.0)
LYMPHS PCT: 19 % (ref 12–46)
MCH: 30.3 pg (ref 26.0–34.0)
MCHC: 35.4 g/dL (ref 30.0–36.0)
MCV: 85.7 fL (ref 78.0–100.0)
MONOS PCT: 9 % (ref 3–12)
Monocytes Absolute: 0.9 10*3/uL (ref 0.1–1.0)
Neutro Abs: 7 10*3/uL (ref 1.7–7.7)
Neutrophils Relative %: 69 % (ref 43–77)
Platelets: 238 10*3/uL (ref 150–400)
RBC: 4.82 MIL/uL (ref 4.22–5.81)
RDW: 15.3 % (ref 11.5–15.5)
WBC: 10.1 10*3/uL (ref 4.0–10.5)

## 2013-09-23 LAB — SALICYLATE LEVEL

## 2013-09-23 LAB — URINALYSIS, ROUTINE W REFLEX MICROSCOPIC
BILIRUBIN URINE: NEGATIVE
Glucose, UA: NEGATIVE mg/dL
Hgb urine dipstick: NEGATIVE
Ketones, ur: NEGATIVE mg/dL
Leukocytes, UA: NEGATIVE
Nitrite: NEGATIVE
PH: 5.5 (ref 5.0–8.0)
Protein, ur: NEGATIVE mg/dL
SPECIFIC GRAVITY, URINE: 1.026 (ref 1.005–1.030)
Urobilinogen, UA: 0.2 mg/dL (ref 0.0–1.0)

## 2013-09-23 LAB — ACETAMINOPHEN LEVEL

## 2013-09-23 MED ORDER — ONDANSETRON HCL 4 MG/2ML IJ SOLN
4.0000 mg | Freq: Once | INTRAMUSCULAR | Status: AC
  Administered 2013-09-23: 4 mg via INTRAVENOUS
  Filled 2013-09-23: qty 2

## 2013-09-23 MED ORDER — SODIUM CHLORIDE 0.9 % IV BOLUS (SEPSIS)
1000.0000 mL | Freq: Once | INTRAVENOUS | Status: AC
  Administered 2013-09-23: 1000 mL via INTRAVENOUS

## 2013-09-23 MED ORDER — ALPRAZOLAM 0.5 MG PO TABS: 0.5000 mg | ORAL_TABLET | Freq: Every evening | ORAL | Status: DC | PRN

## 2013-09-23 MED ORDER — KETOROLAC TROMETHAMINE 30 MG/ML IJ SOLN
30.0000 mg | Freq: Once | INTRAMUSCULAR | Status: AC
  Administered 2013-09-23: 30 mg via INTRAVENOUS
  Filled 2013-09-23: qty 1

## 2013-09-23 NOTE — ED Notes (Signed)
Patient reports feeling restless overnight, slept the majority of the day on Monday, states he would sleep several hours wake up and take a half an Ambien. Patient states this morning he took another Ambien, Lexapro, & Triliptal without eating with the medications. States he had cookies and milk around 0400.

## 2013-09-23 NOTE — Discharge Instructions (Signed)
Stop taking the new medicines.   Switch back to lexapro.  You may take xanax 0.5 mg at night as needed.   Follow up with your doctor.   Avoid taking cocaine.   Return to ER if you have chest pain, shortness of breath, thoughts of harming yourself or others.

## 2013-09-23 NOTE — ED Notes (Signed)
Patient given urinal, will attempt to urinate.

## 2013-09-23 NOTE — ED Provider Notes (Signed)
CSN: [id number]     Arrival date & time 09/23/13  5974 History   First MD Initiated Contact with Patient 09/23/13 [phone]     Chief Complaint  Patient presents with  . Nausea  . feeling "bad"   . Chest Pain     (Consider location/radiation/quality/duration/timing/severity/associated sxs/prior Treatment) The history is provided by the patient.  Peter [name] is a 58 y.o. male hx of anxiety, depression here with nausea, diaphoresis, anxiety. Patient took Lexapro, Trileptal, aVF at 6 AM this morning. He was trying to sleep all day. About half an hour after taking it he certainly had some diaphoresis and chest pain and nausea. He called EMS and was given 4 mg of Zofran. He denies any suicidal thoughts today. He came here on May 1 for suicidal ideations. He was admitted to [facility] and now he has no suicidal or homicidal ideations. Denies history of CAD.    Past Medical History  Diagnosis Date  . Anxiety state, unspecified 12/30/2012  . Depression    Past Surgical History  Procedure Laterality Date  . Right hip tha  2002  . Right hip revision  2007  . Tumor removed     Family History  Problem Relation Age of Onset  . Alcoholism    . Arthritis    . Lung cancer    . Heart disease    . Stroke    . Hypertension     History  Substance Use Topics  . Smoking status: Former Research scientist (life sciences)  . Smokeless tobacco: Not on file  . Alcohol Use: Yes     Comment: occ wine    Review of Systems  Cardiovascular: Positive for chest pain.  All other systems reviewed and are negative.     Allergies  Demerol and Lunesta  Home Medications   Prior to Admission medications   Medication Sig Start Date End Date Taking? Authorizing Provider  clonazePAM (KLONOPIN) 1 MG tablet Take 1 tablet (1 mg total) by mouth 2 (two) times daily as needed for anxiety. 07/17/13   Biagio Borg, MD  escitalopram (LEXAPRO) 10 MG tablet Take 1 tablet (10 mg total) by mouth daily. 07/17/13 10/15/13  Biagio Borg, MD   omeprazole (PRILOSEC) 20 MG capsule Take 20 mg by mouth daily as needed. For acid reflux    Historical Provider, MD  traMADol (ULTRAM-ER) 200 MG 24 hr tablet Take 200 mg by mouth daily as needed for pain.    Historical Provider, MD  zolpidem (AMBIEN) 10 MG tablet Take 5 mg by mouth daily as needed for sleep.    Historical Provider, MD   BP 132/84  Pulse 72  Temp(Src) 98.6 F (37 C) (Oral)  Resp 21  Ht 5\' 8"  (1.727 m)  Wt 180 lb (81.647 kg)  BMI 27.38 kg/m2  SpO2 100% Physical Exam  Nursing note and vitals reviewed. Constitutional: He is oriented to person, place, and time. He appears well-nourished.  Slightly anxious   HENT:  Head: Normocephalic.  Mouth/Throat: Oropharynx is clear and moist.  Eyes: Conjunctivae are normal. Pupils are equal, round, and reactive to light.  Neck: Normal range of motion. Neck supple.  Cardiovascular: Normal rate, regular rhythm and normal heart sounds.   Pulmonary/Chest: Effort normal and breath sounds normal. No respiratory distress. He has no wheezes. He has no rales.  Abdominal: Soft. Bowel sounds are normal. He exhibits no distension. There is no tenderness. There is no rebound and no guarding.  Musculoskeletal: Normal range of motion.  Neurological: He is alert and oriented to person, place, and time. No cranial nerve deficit. Coordination normal.  Skin: Skin is warm.  Psychiatric: He has a normal mood and affect. His behavior is normal. Judgment and thought content normal.  Not suicidal     ED Course  Procedures (including critical care time) Labs Review Labs Reviewed  BASIC METABOLIC PANEL - Abnormal; Notable for the following:    GFR calc non Af Amer 76 (*)    GFR calc Af Amer 88 (*)    All other components within normal limits  URINE RAPID DRUG SCREEN (HOSP PERFORMED) - Abnormal; Notable for the following:    Cocaine POSITIVE (*)    Benzodiazepines POSITIVE (*)    All other components within normal limits  URINALYSIS, ROUTINE W  REFLEX MICROSCOPIC - Abnormal; Notable for the following:    APPearance CLOUDY (*)    All other components within normal limits  SALICYLATE LEVEL - Abnormal; Notable for the following:    Salicylate Lvl <7.0 (*)    All other components within normal limits  CBC WITH DIFFERENTIAL  ETHANOL  ACETAMINOPHEN LEVEL  TROPONIN I  TROPONIN I    Imaging Review Dg Chest 2 View  09/23/2013   CLINICAL DATA:  Chest pressure  EXAM: CHEST  2 VIEW  COMPARISON:  July 16, 2013  FINDINGS: Lungs are clear. Heart size and pulmonary vascularity are normal. No adenopathy. No pneumothorax. There is mild degenerative change in the thoracic spine. There is slight upper thoracic levoscoliosis.  IMPRESSION: No edema or consolidation.   Electronically Signed   By: Lowella Grip M.D.   On: 09/23/2013 07:48     EKG Interpretation   Date/Time:  Tuesday Sep 23 2013 07:01:40 EDT Ventricular Rate:  59 PR Interval:  148 QRS Duration: 82 QT Interval:  387 QTC Calculation: 383 R Axis:   21 Text Interpretation:  Sinus rhythm Low voltage, precordial leads No  significant change since last tracing Confirmed by Jakarri Lesko  MD, Sheehan Stacey ([id number])  on 09/23/2013 7:18:35 AM      MDM   Final diagnoses:  None    Peter [name] is a 58 y.o. male here with chest pain, diaphoresis. Likely medication side effect vs panic attack. I doubt ACS but given recent onset will get trop x 2. I doubt PE and he is PERC neg. Not suicidal now so I don't think he needs psych consult.   11:17 AM Labs significant for + cocaine. Used cocaine 3 days ago. Trop neg x 2. Now pain free. I think chest pain likely combination of med side effect vs anxiety vs cocaine use. Recommend switch back to his lexapro and xanax and d/c trileptal and seroquel. I also told him not to use cocaine.    Wandra Arthurs, MD 09/23/13 1118

## 2013-09-23 NOTE — ED Notes (Addendum)
Per EMS, patient called 911 c/o nausea, diaphoretic, and anxious. Patient reports recent stay at [facility] for [place], denies today. States within 30 minutes PTA of EMS patient took Lexapro, Trileptil, and Ambien. Patient reports taking Lexapro in the past but not recently, and Triliptal is a new medication. Patient reports to EMS he may have taken an extra Ambien within the past 24 hours. Patient given 4mg  IV Zofran while en route. Patient states he took his medications on an empty stomach this am. On arrival to ED patient reported chest pain to EMS.

## 2013-09-23 NOTE — ED Notes (Signed)
Bed: WA05 Expected date:  Expected time:  Means of arrival:  Comments: EMS 

## 2013-09-24 ENCOUNTER — Encounter: Payer: Self-pay | Admitting: Internal Medicine

## 2013-09-24 ENCOUNTER — Encounter: Payer: Self-pay | Admitting: Family Medicine

## 2013-09-24 ENCOUNTER — Ambulatory Visit (INDEPENDENT_AMBULATORY_CARE_PROVIDER_SITE_OTHER): Payer: Managed Care, Other (non HMO) | Admitting: Family Medicine

## 2013-09-24 VITALS — BP 132/76 | HR 67 | Temp 97.8°F | Resp 16 | Wt 180.1 lb

## 2013-09-24 DIAGNOSIS — F411 Generalized anxiety disorder: Secondary | ICD-10-CM

## 2013-09-24 MED ORDER — CLONAZEPAM 0.5 MG PO TABS: 0.5000 mg | ORAL_TABLET | Freq: Two times a day (BID) | ORAL | Status: DC | PRN

## 2013-09-24 MED ORDER — VENLAFAXINE HCL ER 37.5 MG PO CP24: ORAL_CAPSULE | ORAL | Status: DC

## 2013-09-24 MED ORDER — HYDROXYZINE HCL 25 MG PO TABS: 25.0000 mg | ORAL_TABLET | Freq: Three times a day (TID) | ORAL | Status: DC | PRN

## 2013-09-24 NOTE — Progress Notes (Signed)
  [name] [address], [place] [address] Phone: [phone] Subjective:     CC: anxiety  QMG:[id number] Peter [name] is a 58 y.o. male coming in with complaint of anxiety. Patient has had this problem since August of 2014. Patient was doing fairly well but unfortunately recently has had multiple dense in the family including his brother-in-law who is close to. In addition this patient was recently in behavioral medicine facility. Patient was admitted for quite some time because he was having some homicidal ideation. Patient states though that is no longer a problem. Patient has no suicidal or homicidal ideation. Patient is feeling very well but unfortunately continues to have generalized anxiety that seems to be all times. Patient states that he wakes up in the morning and feels anxious immediately. Patient had been taking Xanax for this but problem and had noticed some mild improvement. Other than that the patient did have a recent panic attack and was seen in the emergency department. There is actually more of a concern the patient was having an allergic reaction to the medication and I told him to stop all the medications at this time. Patient at this time is not taking any medicine and feels that he is having significant anxiety. Patient started difficult to do his regular activities her stay as active. 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 Patient states all of this is causing him to be very frustrated and having difficulty moving forward in life.     Past medical history, social, surgical and family history all reviewed in electronic medical record.   Review of Systems: No headache, visual changes, nausea, vomiting, diarrhea, constipation, dizziness, abdominal pain, skin rash, fevers, chills, night sweats, weight loss, swollen lymph  nodes, body aches, joint swelling, muscle aches, chest pain, shortness of breath, mood changes.   Objective Blood pressure 132/76, pulse 67, temperature 97.8 F (36.6 C), temperature source Oral, resp. rate 16, weight 180 lb 1.6 oz (81.693 kg), SpO2 96.00%.  General: No apparent distress alert and oriented x3 mood and affect normal, dressed appropriately. Patient does seem to be somewhat anxious and does have some mild circumferential answers to questions. HEENT: Pupils equal, extraocular movements intact  Respiratory: Patient's speak in full sentences and does not appear short of breath  Cardiovascular: No lower extremity edema, non tender, no erythema  Skin: Warm dry intact with no signs of infection or rash on extremities or on axial skeleton.  Abdomen: Soft nontender  Neuro: Cranial nerves II through XII are intact, neurovascularly intact in all extremities with 2+ DTRs and 2+ pulses.  Lymph: No lymphadenopathy of posterior or anterior cervical chain or axillae bilaterally.  Gait normal with good balance and coordination.  MSK:  Non tender with full range of motion and good stability and symmetric strength and tone of shoulders, elbows, wrist, hip, knee and ankles bilaterally. Patient does have a hip replacement on the right.    Impression and Recommendations:     This case required medical decision making of moderate complexity.

## 2013-09-24 NOTE — Patient Instructions (Signed)
Nice to meet Publishing copy daily.  One pill daily first week then 2 pills daily thereafter.  Klonopin twice daily for first week then as needed while bridging the effexor.  Hydroxyzine for breakthrough anxiety Follow up in 2 weeks to make sure you are doing well.  We are always here to help.

## 2013-09-24 NOTE — Assessment & Plan Note (Signed)
The patient does have a generalized anxiety disorder that I think his not being treated quite sufficiently. I do believe the patient does need a maintenance medication. Patient's other side effects from the medications I think SNRI would be helpful. Patient was started on Effexor and will do abridging with Clonopin only over the course of the next week. Patient will use hydroxyzine for breakthrough. 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 Patient is going to try these medications he'll call if he has any side effects. Otherwise he will followup with primary care provider in 2 weeks.

## 2013-09-24 NOTE — Progress Notes (Signed)
Pre-visit discussion using our clinic review tool. No additional management support is needed unless otherwise documented below in the visit note.  

## 2013-09-24 NOTE — Telephone Encounter (Signed)
Caller: Aryon/Patient; PCP: Cathlean Cower (Adults only); CB#: [phone];  Today, 09/24/2013 , pt calling with  concerns about is anxiety- He was taking Xanax , but changed to Klonopin ~ 2 months ago ,but not as effective and went back  to taking Xanax.  At his OV 07/17/2013  Dr Jenny Reichmann wanted him to  see Therapist which he has ,  but   she can't write rx's.   He had recent bouts of depression  thinking about pending hip surgery and continued anxiety and he  admitted himself into [facility] ~ 2 weeks ago because of suicidal thoughts. He is still dealing with anxiety and panic attacks and   was seen at ED on 09/23/2013 for episodes of sweating, dizziness and thought related to new medications ordered .   Pt denied suicidal thoughts, nor attempts stating    "I value my life" .  Pt is requesting to see Dr. Jenny Reichmann and wanting rx for Xanax because it helps better with panic attacks. RN  reached Call Provider within 24 hours for increasing symptoms and taking prescribed medications as prescribed per Anxiety protocol. Last OV 07/17/2013 .  Rn advised OV per nursing judgment and scheduled with Dr. Tamala Julian for 130 today -- no available appts with Dr. Jenny Reichmann.

## 2013-09-25 ENCOUNTER — Telehealth: Payer: Self-pay | Admitting: *Deleted

## 2013-09-25 NOTE — Telephone Encounter (Signed)
[facility] Triage Call Report Triage Record Num: [id number] Operator: Nancie Neas Patient Name: Peter [name] Call Date & Time: 09/24/2013 8:49:37AM Patient Phone: [phone] PCP: Cathlean Cower Patient Gender: Male PCP Fax : [phone] Patient DOB: 1956/01/06 Practice Name: [facility] Reason for Call: Caller: Aikeem/Patient; PCP: Cathlean Cower (Adults only); CB#: [phone]; Today, 09/24/2013 , pt calling with concerns about is anxiety- He was taking Xanax , but changed to Klonopin ~ 2 months ago ,but not as effective and went back to taking Xanax. At his OV 07/17/2013 Dr Jenny Reichmann wanted him to see Therapist which he has , but she can't write rx's. He had recent bouts of depression thinking about pending hip surgery and continued anxiety and he admitted himself into [facility] ~ 2 weeks ago because of suicidal thoughts. He is still dealing with anxiety and panic attacks and was seen at ED on 09/23/2013 for episodes of sweating, dizziness and thought related to new medications ordered . Pt denied suicidal thoughts, nor attempts stating "I value my life" . Pt is requesting to see Dr. Jenny Reichmann and wanting rx for Xanax because it helps better with panic attacks. RN reached Call Provider within 24 hours for increasing symptoms and taking prescribed medications as prescribed per Anxiety protocol. Last OV 07/17/2013 . Rn advised OV today per nursing judgment and scheduled with Dr. Tamala Julian for 130 today -- no available appts with Dr. Jenny Reichmann. Triage note copied to EPIC record Protocol(s) Used: Anxiety Recommended Outcome per Protocol: Call Provider within 24 Hours Override Outcome if Used in Protocol: See Provider within 24 hours RN Reason for Override Outcome: Nursing Judgement Used. Reason for Outcome: New or increasing symptoms AND taking medications/following therapy as prescribed Care Advice: ~ SYMPTOM / CONDITION MANAGEMENT ~ Call provider immediately if behavior becomes a threat to self or  others. Continue to follow your treatment plan until seeing or talking with your provider. Take all medications as prescribed. If you have questions or concerns about your treatment plan, ask about them when you call for your appointment. ~ 09/24/2013 9:29:11AM Page 1 of 1 [id number]

## 2013-10-11 ENCOUNTER — Other Ambulatory Visit: Payer: Self-pay | Admitting: Family Medicine

## 2013-10-16 NOTE — Telephone Encounter (Signed)
Refill done.  

## 2013-10-24 ENCOUNTER — Ambulatory Visit: Payer: Self-pay | Admitting: Internal Medicine

## 2013-10-24 DIAGNOSIS — Z0289 Encounter for other administrative examinations: Secondary | ICD-10-CM

## 2013-10-29 ENCOUNTER — Ambulatory Visit: Payer: Self-pay | Admitting: Internal Medicine

## 2013-10-29 ENCOUNTER — Other Ambulatory Visit: Payer: Self-pay | Admitting: Family Medicine

## 2013-10-31 NOTE — Telephone Encounter (Signed)
Ok to refill? Last OV 3.5.15 Last filled 5.13.15

## 2013-10-31 NOTE — Telephone Encounter (Signed)
Done hardcopy to [facility]  

## 2013-11-04 NOTE — Telephone Encounter (Signed)
Faxed hardcopy to [facility] 

## 2013-11-07 ENCOUNTER — Other Ambulatory Visit: Payer: Self-pay | Admitting: Internal Medicine

## 2013-12-08 ENCOUNTER — Other Ambulatory Visit: Payer: Self-pay | Admitting: Family Medicine

## 2013-12-09 NOTE — Telephone Encounter (Signed)
Refill done.  

## 2013-12-16 ENCOUNTER — Other Ambulatory Visit: Payer: Self-pay | Admitting: Internal Medicine

## 2013-12-16 NOTE — Telephone Encounter (Signed)
Faxed hardcopy to [facility] [place]  

## 2013-12-16 NOTE — Telephone Encounter (Signed)
Done hardcopy to [facility]  

## 2014-01-21 ENCOUNTER — Ambulatory Visit: Payer: Managed Care, Other (non HMO) | Admitting: Internal Medicine

## 2014-01-23 ENCOUNTER — Other Ambulatory Visit: Payer: Self-pay | Admitting: Internal Medicine

## 2014-01-27 ENCOUNTER — Other Ambulatory Visit: Payer: Self-pay | Admitting: Internal Medicine

## 2014-01-27 NOTE — Telephone Encounter (Signed)
Done hardcopy to [facility]  

## 2014-01-28 NOTE — Telephone Encounter (Signed)
Faxed hardcopy to pharmacy. 

## 2014-02-27 ENCOUNTER — Other Ambulatory Visit: Payer: Self-pay

## 2014-03-13 ENCOUNTER — Other Ambulatory Visit: Payer: Self-pay | Admitting: Family Medicine

## 2014-03-13 NOTE — Telephone Encounter (Signed)
Refill done.  

## 2014-03-30 ENCOUNTER — Other Ambulatory Visit: Payer: Self-pay | Admitting: Internal Medicine

## 2014-03-31 NOTE — Telephone Encounter (Signed)
Done hardcopy to [facility]  

## 2014-03-31 NOTE — Telephone Encounter (Signed)
Faxed hardcopy for Clonazepam to [facility]

## 2014-05-19 ENCOUNTER — Telehealth: Payer: Self-pay

## 2014-05-19 NOTE — Telephone Encounter (Signed)
The patient has been out of work for 5 days for the flu and needs an ok to return to work note due to his office policy, otherwise he will not be able to keep his job.  The is unable to be seen at this time as he has no health insurance.

## 2014-05-19 NOTE — Telephone Encounter (Signed)
Patient informed of MD instructions. Patient will try and schedule appt.

## 2014-05-19 NOTE — Telephone Encounter (Signed)
Very sorry, but I cannot do this.  He may need to consider urgent care visit

## 2014-05-20 ENCOUNTER — Ambulatory Visit (INDEPENDENT_AMBULATORY_CARE_PROVIDER_SITE_OTHER): Payer: Self-pay | Admitting: Internal Medicine

## 2014-05-20 ENCOUNTER — Encounter: Payer: Self-pay | Admitting: Internal Medicine

## 2014-05-20 ENCOUNTER — Telehealth: Payer: Self-pay

## 2014-05-20 VITALS — BP 130/82 | HR 100 | Temp 97.4°F | Ht 68.0 in | Wt 186.5 lb

## 2014-05-20 DIAGNOSIS — N529 Male erectile dysfunction, unspecified: Secondary | ICD-10-CM

## 2014-05-20 DIAGNOSIS — F418 Other specified anxiety disorders: Secondary | ICD-10-CM

## 2014-05-20 MED ORDER — ZOLPIDEM TARTRATE 10 MG PO TABS: 10.0000 mg | ORAL_TABLET | Freq: Every evening | ORAL | Status: DC | PRN

## 2014-05-20 MED ORDER — CLONAZEPAM 0.5 MG PO TABS: 0.5000 mg | ORAL_TABLET | Freq: Two times a day (BID) | ORAL | Status: DC | PRN

## 2014-05-20 NOTE — Assessment & Plan Note (Signed)
Declines viagra due to cost

## 2014-05-20 NOTE — Assessment & Plan Note (Signed)
stable overall by history and exam, and pt to continue medical treatment as before,  to f/u any worsening symptoms or concerns 

## 2014-05-20 NOTE — Telephone Encounter (Signed)
Ok to fill both now

## 2014-05-20 NOTE — Progress Notes (Signed)
Pre visit review using our clinic review tool, if applicable. No additional management support is needed unless otherwise documented below in the visit note. 

## 2014-05-20 NOTE — Patient Instructions (Addendum)
Please continue all other medications as before, and refills have been done if requested.  Please have the pharmacy call with any other refills you may need.  Please continue your efforts at being more active, low cholesterol diet, and weight control.  You are otherwise up to date with prevention measures today.  Please keep your appointments with your specialists as you may have planned  You are given the work note today

## 2014-05-20 NOTE — Progress Notes (Signed)
Subjective:    Patient ID: Peter [name], male    DOB: [birth year], 59 y.o.   MRN: [id number]  HPI  Here to f/u; overall doing ok,  Pt denies chest pain, increased sob or doe, wheezing, orthopnea, PND, increased LE swelling, palpitations, dizziness or syncope.  Pt denies polydipsia, polyuria  Pt denies new neurological symptoms such as new headache, or facial or extremity weakness or numbness.   Pt states overall good compliance with meds, has been trying to follow lower cholesterol diet, with wt overall stable,  but little exercise however.  Denies worsening depressive symptoms, suicidal ideation, or panic; has ongoing anxiety, not increased recently, last hospd jan 2016 for depression per pt, then counseling for about 6 mo after, but then none for 6 mo as he felt was doing ok.  Taking the hydroxzine rpn anxiety, also klonopin .5 qhs prn helps with sleep.  Has significant financial stressors.  Has new relationship, asks for viagra type med. 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  Not taking the trileptal and effexor due to cost  .  Did have acute bronchitis like illness, needs note to be able to go back to work,  Has been out from mon dec 28 to return today  Past Medical History  Diagnosis Date  . Anxiety state, unspecified 12/30/2012  . Depression    Past Surgical History  Procedure Laterality Date  . Right hip tha  2002  . Right hip revision  2007  . Tumor removed      reports that he has quit smoking. He does not have any smokeless tobacco history on file. He reports that he drinks alcohol. He reports that he does not use illicit drugs. family history includes Alcoholism in an other family member; Arthritis in an other family member; Heart disease in an other family member; Hypertension in an other family member; Lung cancer in an other family member; Stroke in an other family member. Allergies  Allergen Reactions  . Demerol [Meperidine]     hallucinations  .  Lunesta [Eszopiclone]    Current Outpatient Prescriptions on File Prior to Visit  Medication Sig Dispense Refill  . clonazePAM (KLONOPIN) 0.5 MG tablet TAKE 1 TABLET BY MOUTH TWICE DAILY AS NEEDED FOR ANXIETY 60 tablet 1  . hydrOXYzine (ATARAX/VISTARIL) 25 MG tablet TAKE 1 TABLET BY MOUTH 3 TIMES DAILY AS NEEDED FOR ANXIETY 90 tablet 1  . Multiple Vitamin (MULTIVITAMIN WITH MINERALS) TABS tablet Take 1 tablet by mouth daily.    Marland Kitchen zolpidem (AMBIEN) 10 MG tablet TAKE 1 TABLET BY MOUTH AT BEDTIME AS NEEDED FOR SLEEP 30 tablet 5   No current facility-administered medications on file prior to visit.   Review of Systems All otherwise neg per pt    Objective:   Physical Exam BP 130/82 mmHg  Pulse 100  Temp(Src) 97.4 F (36.3 C) (Oral)  Ht 5\' 8"  (1.727 m)  Wt 186 lb 8 oz (84.596 kg)  BMI 28.36 kg/m2  SpO2 98% VS noted,  Constitutional: Pt appears well-developed, well-nourished.  HENT: Head: NCAT.  Right Ear: External ear normal.  Left Ear: External ear normal.  Eyes: . Pupils are equal, round, and reactive to light. Conjunctivae and EOM are normal Neck: Normal range of motion. Neck supple.  Cardiovascular: Normal rate and regular rhythm.   Pulmonary/Chest: Effort normal and breath sounds without rales or wheezing.  Abd:  Soft, NT, ND, + BS Neurological: Pt is alert. Not confused , motor  grossly intact Skin: Skin is warm. No rash Psychiatric: Pt behavior is overall normal normal. No agitation. mod nervous, pressure speech    Assessment & Plan:

## 2014-05-20 NOTE — Telephone Encounter (Signed)
Pharmacy called and informed the patient received #30 of zolpidem on 05/09/14 and #60 clonazepam on 04/27/14.    PCP has been informed of the quantity and date of these refills and did ok to fill Clonazepam today, but to hold the refill of Zolpidem until 05/10/15 when due. Patient has been informed of refill information on both medications.(had to leave a detailed message)

## 2014-05-20 NOTE — Telephone Encounter (Signed)
The patient took both the zopidem and clonazepam to the pharmacy to be filled.. They stated they can fill the zolpidem on January 26 and clonazepam on January 10th.  The patient states he is out of both and if PCP authorizes ok  To fill today they will, otherwise he will have to wait??

## 2014-06-10 ENCOUNTER — Ambulatory Visit: Payer: Self-pay | Admitting: Internal Medicine

## 2014-06-11 ENCOUNTER — Ambulatory Visit (INDEPENDENT_AMBULATORY_CARE_PROVIDER_SITE_OTHER): Payer: Self-pay | Admitting: Internal Medicine

## 2014-06-11 ENCOUNTER — Encounter: Payer: Self-pay | Admitting: Internal Medicine

## 2014-06-11 VITALS — BP 134/100 | HR 93 | Temp 98.0°F | Ht 68.0 in | Wt 187.5 lb

## 2014-06-11 DIAGNOSIS — R030 Elevated blood-pressure reading, without diagnosis of hypertension: Secondary | ICD-10-CM

## 2014-06-11 DIAGNOSIS — F418 Other specified anxiety disorders: Secondary | ICD-10-CM

## 2014-06-11 DIAGNOSIS — IMO0001 Reserved for inherently not codable concepts without codable children: Secondary | ICD-10-CM

## 2014-06-11 DIAGNOSIS — G43809 Other migraine, not intractable, without status migrainosus: Secondary | ICD-10-CM

## 2014-06-11 DIAGNOSIS — G43909 Migraine, unspecified, not intractable, without status migrainosus: Secondary | ICD-10-CM | POA: Insufficient documentation

## 2014-06-11 MED ORDER — PROMETHAZINE HCL 25 MG/ML IJ SOLN
25.0000 mg | Freq: Once | INTRAMUSCULAR | Status: AC
  Administered 2014-06-11: 25 mg via INTRAMUSCULAR

## 2014-06-11 MED ORDER — KETOROLAC TROMETHAMINE 30 MG/ML IJ SOLN
30.0000 mg | Freq: Once | INTRAMUSCULAR | Status: AC
  Administered 2014-06-11: 30 mg via INTRAMUSCULAR

## 2014-06-11 MED ORDER — SUMATRIPTAN SUCCINATE 100 MG PO TABS: 100.0000 mg | ORAL_TABLET | ORAL | Status: DC | PRN

## 2014-06-11 NOTE — Assessment & Plan Note (Signed)
Mild elev today, likely reactive, cont to monitor BP at home and next visit  BP Readings from Last 3 Encounters:  06/11/14 134/100  05/20/14 130/82  09/24/13 132/76

## 2014-06-11 NOTE — Assessment & Plan Note (Addendum)
Severe, allergic to demerol,  for toradol/phenergan 50/25, IM, has friend to drive home, ok for imitrex prn,  to f/u any worsening symptoms or concerns

## 2014-06-11 NOTE — Assessment & Plan Note (Signed)
stable overall by history and exam, recent data reviewed with pt, and pt to continue medical treatment as before,  to f/u any worsening symptoms or concerns, delcines need for counseling or other referral psychiatry at this time

## 2014-06-11 NOTE — Progress Notes (Signed)
Subjective:    Patient ID: Peter [name], male    DOB: May 21, 1955, 59 y.o.   MRN: [id number]  HPI  Here with pain HA x 3 days, started left post HA, today more like icepick type pain near the left eye, constant, severe, assoc with nausea, not better with 10-12 excedrin migraine, worse with activity.   No fever, Pt denies chest pain, increased sob or doe, wheezing, orthopnea, PND, increased LE swelling, palpitations, dizziness or syncope. No sinus congestion. But + severe photophobia and nausea. Denies worsening depressive symptoms, suicidal ideation, or panic.  No hx of elev BP Only gets migraine like this 1-2 times per yr. Past Medical History  Diagnosis Date  . Anxiety state, unspecified 12/30/2012  . Depression    Past Surgical History  Procedure Laterality Date  . Right hip tha  2002  . Right hip revision  2007  . Tumor removed      reports that he has quit smoking. He does not have any smokeless tobacco history on file. He reports that he drinks alcohol. He reports that he does not use illicit drugs. family history includes Alcoholism in an other family member; Arthritis in an other family member; Heart disease in an other family member; Hypertension in an other family member; Lung cancer in an other family member; Stroke in an other family member. Allergies  Allergen Reactions  . Demerol [Meperidine]     hallucinations  . Lunesta [Eszopiclone]    Current Outpatient Prescriptions on File Prior to Visit  Medication Sig Dispense Refill  . clonazePAM (KLONOPIN) 0.5 MG tablet Take 1 tablet (0.5 mg total) by mouth 2 (two) times daily as needed. for anxiety 60 tablet 2  . hydrOXYzine (ATARAX/VISTARIL) 25 MG tablet TAKE 1 TABLET BY MOUTH 3 TIMES DAILY AS NEEDED FOR ANXIETY 90 tablet 1  . Multiple Vitamin (MULTIVITAMIN WITH MINERALS) TABS tablet Take 1 tablet by mouth daily.    Marland Kitchen zolpidem (AMBIEN) 10 MG tablet Take 1 tablet (10 mg total) by mouth at bedtime as needed. for sleep 30 tablet  5   No current facility-administered medications on file prior to visit.   Review of Systems  Constitutional: Negative for unusual diaphoresis or other sweats  HENT: Negative for ringing in ear Eyes: Negative for double vision or worsening visual disturbance.  Respiratory: Negative for choking and stridor.   Gastrointestinal: Negative for vomiting or other signifcant bowel change Genitourinary: Negative for hematuria or decreased urine volume.  Musculoskeletal: Negative for other MSK pain or swelling Skin: Negative for color change and worsening wound.  Neurological: Negative for tremors and numbness other than noted  Psychiatric/Behavioral: Negative for decreased concentration or agitation other than above       Objective:   Physical Exam BP 134/100 mmHg  Pulse 93  Temp(Src) 98 F (36.7 C) (Oral)  Ht 5\' 8"  (1.727 m)  Wt 187 lb 8 oz (85.049 kg)  BMI 28.52 kg/m2  SpO2 98% VS noted,  Constitutional: Pt appears well-developed, well-nourished.  HENT: Head: NCAT.  Right Ear: External ear normal.  Left Ear: External ear normal.  Eyes: . Pupils are equal, round, and reactive to light. Conjunctivae and EOM are normal Neck: Normal range of motion. Neck supple.  Cardiovascular: Normal rate and regular rhythm.   Pulmonary/Chest: Effort normal and breath sounds without rales or wheezing.  Abd:  Soft, NT, ND, + BS Neurological: Pt is alert. Not confused , motor intact, sens, dtr intact, gait not tested,  Skin: Skin is  warm. No rash Psychiatric: Pt behavior is normal. No agitation. mild nervous, not depressed affect    Assessment & Plan:

## 2014-06-11 NOTE — Progress Notes (Signed)
Pre visit review using our clinic review tool, if applicable. No additional management support is needed unless otherwise documented below in the visit note. 

## 2014-06-11 NOTE — Patient Instructions (Addendum)
You had the pain shot today (toradol) and nausea shot (phenergan)  Please take all new medication as prescribed - the imitrex for Headache in the future  You can also take Advil Migraine with the imitrex if this helps  Please continue all other medications as before, and refills have been done if requested.  Please have the pharmacy call with any other refills you may need.  Please keep your appointments with your specialists as you may have planned

## 2014-07-16 ENCOUNTER — Other Ambulatory Visit: Payer: Self-pay | Admitting: Family Medicine

## 2014-07-16 NOTE — Telephone Encounter (Signed)
Dr. Tamala Julian has not seen this pt for anxiety since 09/2013.  Okay to refill this med?

## 2014-08-11 ENCOUNTER — Other Ambulatory Visit: Payer: Self-pay | Admitting: Internal Medicine

## 2014-08-11 NOTE — Telephone Encounter (Signed)
Rx sent 

## 2014-08-11 NOTE — Telephone Encounter (Signed)
Done hardcopy to Cherina  

## 2014-11-04 ENCOUNTER — Telehealth: Payer: Self-pay

## 2014-11-04 MED ORDER — CLONAZEPAM 0.5 MG PO TABS: ORAL_TABLET | ORAL | Status: DC

## 2014-11-04 NOTE — Telephone Encounter (Signed)
Done hardcopy to steph 

## 2014-11-05 ENCOUNTER — Other Ambulatory Visit: Payer: Self-pay | Admitting: Internal Medicine

## 2014-11-05 NOTE — Telephone Encounter (Signed)
rx already done June 22

## 2014-11-05 NOTE — Telephone Encounter (Signed)
I will need to call the pharmacy and verify that a PA is needed.

## 2014-11-05 NOTE — Telephone Encounter (Signed)
Patient is calling to advise that he is getting conflicting info re: the two klonopin rx's requested. The pharmacy is still advising that a PA is needed. Please check on this and touch base with the patient to advise either way. Rx# [id number]

## 2014-11-06 NOTE — Telephone Encounter (Signed)
Pt was filling early that is the reason that it was denied. NO PA needed.

## 2014-11-06 NOTE — Telephone Encounter (Signed)
Ready to be faxed.

## 2014-11-14 ENCOUNTER — Encounter (HOSPITAL_COMMUNITY): Payer: Self-pay | Admitting: Nurse Practitioner

## 2014-11-14 ENCOUNTER — Emergency Department (HOSPITAL_COMMUNITY)
Admission: EM | Admit: 2014-11-14 | Discharge: 2014-11-14 | Disposition: A | Discharge status: Home / Self Care | Payer: Managed Care, Other (non HMO) | Attending: Emergency Medicine | Admitting: Emergency Medicine

## 2014-11-14 DIAGNOSIS — F419 Anxiety disorder, unspecified: Secondary | ICD-10-CM | POA: Insufficient documentation

## 2014-11-14 DIAGNOSIS — Z79899 Other long term (current) drug therapy: Secondary | ICD-10-CM | POA: Diagnosis not present

## 2014-11-14 DIAGNOSIS — F329 Major depressive disorder, single episode, unspecified: Secondary | ICD-10-CM | POA: Diagnosis not present

## 2014-11-14 DIAGNOSIS — G43909 Migraine, unspecified, not intractable, without status migrainosus: Secondary | ICD-10-CM | POA: Diagnosis present

## 2014-11-14 DIAGNOSIS — G43809 Other migraine, not intractable, without status migrainosus: Secondary | ICD-10-CM | POA: Diagnosis not present

## 2014-11-14 DIAGNOSIS — Z87891 Personal history of nicotine dependence: Secondary | ICD-10-CM | POA: Diagnosis not present

## 2014-11-14 HISTORY — DX: Migraine, unspecified, not intractable, without status migrainosus: G43.909

## 2014-11-14 MED ORDER — DIPHENHYDRAMINE HCL 50 MG/ML IJ SOLN
12.5000 mg | Freq: Once | INTRAMUSCULAR | Status: AC
  Administered 2014-11-14: 12.5 mg via INTRAVENOUS
  Filled 2014-11-14: qty 1

## 2014-11-14 MED ORDER — PROCHLORPERAZINE EDISYLATE 5 MG/ML IJ SOLN
10.0000 mg | Freq: Four times a day (QID) | INTRAMUSCULAR | Status: DC | PRN
  Administered 2014-11-14: 10 mg via INTRAVENOUS
  Filled 2014-11-14: qty 2

## 2014-11-14 MED ORDER — KETOROLAC TROMETHAMINE 30 MG/ML IJ SOLN
15.0000 mg | Freq: Once | INTRAMUSCULAR | Status: AC
  Administered 2014-11-14: 15 mg via INTRAVENOUS
  Filled 2014-11-14: qty 1

## 2014-11-14 MED ORDER — SODIUM CHLORIDE 0.9 % IV BOLUS (SEPSIS)
1000.0000 mL | Freq: Once | INTRAVENOUS | Status: AC
  Administered 2014-11-14: 1000 mL via INTRAVENOUS

## 2014-11-14 NOTE — ED Notes (Signed)
He c/o migraine symptoms x 2 days. He reports nausea, sensitivity to light and noise, feels like an ice pick behind his L eye. He took ibuprofen and excedrin for migraine with no relief. He is A&Ox4, resp e/u

## 2014-11-14 NOTE — Discharge Instructions (Signed)

## 2014-11-14 NOTE — ED Provider Notes (Signed)
CSN: [id number]     Arrival date & time 11/14/14  1757 History   First MD Initiated Contact with Patient 11/14/14 1845     Chief Complaint  Patient presents with  . Migraine      HPI He c/o migraine symptoms x 2 days. He reports nausea, sensitivity to light and noise, feels like an ice pick behind his L eye. He took ibuprofen and excedrin for migraine with no relief. He is A&Ox4, resp e/u Past Medical History  Diagnosis Date  . Anxiety state, unspecified 12/30/2012  . Depression   . Migraine    Past Surgical History  Procedure Laterality Date  . Right hip tha  2002  . Right hip revision  2007  . Tumor removed     Family History  Problem Relation Age of Onset  . Alcoholism    . Arthritis    . Lung cancer    . Heart disease    . Stroke    . Hypertension     History  Substance Use Topics  . Smoking status: Former Research scientist (life sciences)  . Smokeless tobacco: Not on file  . Alcohol Use: Yes     Comment: occ wine    Review of Systems  All other systems reviewed and are negative  Allergies  Lunesta and Demerol  Home Medications   Prior to Admission medications   Medication Sig Start Date End Date Taking? Authorizing Provider  clonazePAM (KLONOPIN) 0.5 MG tablet TAKE 1 TABLET TWICE A DAY AS NEEDED FOR ANXIETY Patient taking differently: Take 0.5 mg by mouth 2 (two) times daily as needed for anxiety.  11/04/14  Yes Biagio Borg, MD  KRILL OIL PO Take 1 tablet by mouth daily.   Yes Historical Provider, MD  Menthol, Topical Analgesic, (BIOFREEZE ROLL-ON) 4 % GEL Apply 1 application topically at bedtime as needed (back pain).   Yes Historical Provider, MD  Multiple Vitamin (MULTIVITAMIN WITH MINERALS) TABS tablet Take 1 tablet by mouth daily.   Yes Historical Provider, MD  omeprazole (PRILOSEC) 20 MG capsule Take 20 mg by mouth daily.   Yes Historical Provider, MD  hydrOXYzine (ATARAX/VISTARIL) 25 MG tablet TAKE 1 TABLET BY MOUTH 3 TIMES DAILY AS NEEDED FOR ANXIETY 07/16/14   Biagio Borg, MD   SUMAtriptan (IMITREX) 100 MG tablet Take 1 tablet (100 mg total) by mouth every 2 (two) hours as needed for migraine or headache. May repeat in 2 hours if headache persists or recurs. 06/11/14   Biagio Borg, MD  zolpidem (AMBIEN) 10 MG tablet Take 1 tablet (10 mg total) by mouth at bedtime as needed. for sleep Patient taking differently: Take 5-10 mg by mouth at bedtime as needed for sleep. for sleep 05/20/14   Biagio Borg, MD   BP 108/68 mmHg  Pulse 70  Temp(Src) 98.2 F (36.8 C) (Oral)  Resp 18  Ht 5\' 8"  (1.727 m)  Wt 184 lb 6.4 oz (83.643 kg)  BMI 28.04 kg/m2  SpO2 95% Physical Exam  Constitutional: He is oriented to person, place, and time. He appears well-developed and well-nourished. No distress.  HENT:  Head: Normocephalic and atraumatic.  Eyes: Pupils are equal, round, and reactive to light.  Neck: Normal range of motion.  Cardiovascular: Normal rate and intact distal pulses.   Pulmonary/Chest: No respiratory distress.  Abdominal: Normal appearance. He exhibits no distension.  Musculoskeletal: Normal range of motion.  Neurological: He is alert and oriented to person, place, and time. No cranial nerve deficit or  sensory deficit. GCS eye subscore is 4. GCS verbal subscore is 5. GCS motor subscore is 6.  Skin: Skin is warm and dry. No rash noted.  Psychiatric: He has a normal mood and affect. His behavior is normal.  Nursing note and vitals reviewed.   ED Course  Procedures (including critical care time) Medications  prochlorperazine (COMPAZINE) injection 10 mg (10 mg Intravenous Given 11/14/14 1945)  sodium chloride 0.9 % bolus 1,000 mL (1,000 mLs Intravenous New Bag/Given 11/14/14 1947)  ketorolac (TORADOL) 30 MG/ML injection 15 mg (15 mg Intravenous Given 11/14/14 1907)  diphenhydrAMINE (BENADRYL) injection 12.5 mg (12.5 mg Intravenous Given 11/14/14 1907)    After treatment in the ED the patient feels back to baseline and wants to go home.  MDM   Final diagnoses:  Other  migraine without status migrainosus, not intractable        Leonard Schwartz, MD 11/14/14 2052

## 2014-11-20 ENCOUNTER — Ambulatory Visit: Payer: Managed Care, Other (non HMO) | Admitting: Internal Medicine

## 2014-11-21 ENCOUNTER — Encounter: Payer: Self-pay | Admitting: Family Medicine

## 2014-11-21 ENCOUNTER — Ambulatory Visit (HOSPITAL_COMMUNITY)
Admission: RE | Admit: 2014-11-21 | Discharge: 2014-11-21 | Disposition: A | Discharge status: Home / Self Care | Payer: Managed Care, Other (non HMO) | Source: Ambulatory Visit | Attending: Family Medicine | Admitting: Family Medicine

## 2014-11-21 ENCOUNTER — Encounter (HOSPITAL_COMMUNITY): Payer: Self-pay

## 2014-11-21 ENCOUNTER — Ambulatory Visit (INDEPENDENT_AMBULATORY_CARE_PROVIDER_SITE_OTHER): Payer: Managed Care, Other (non HMO) | Admitting: Family Medicine

## 2014-11-21 VITALS — BP 132/84 | Temp 98.1°F | Ht 68.0 in | Wt 184.0 lb

## 2014-11-21 DIAGNOSIS — M542 Cervicalgia: Secondary | ICD-10-CM | POA: Diagnosis not present

## 2014-11-21 DIAGNOSIS — M47892 Other spondylosis, cervical region: Secondary | ICD-10-CM | POA: Insufficient documentation

## 2014-11-21 DIAGNOSIS — IMO0001 Reserved for inherently not codable concepts without codable children: Secondary | ICD-10-CM

## 2014-11-21 DIAGNOSIS — H538 Other visual disturbances: Secondary | ICD-10-CM | POA: Diagnosis not present

## 2014-11-21 DIAGNOSIS — S0990XS Unspecified injury of head, sequela: Secondary | ICD-10-CM

## 2014-11-21 DIAGNOSIS — M25511 Pain in right shoulder: Secondary | ICD-10-CM | POA: Diagnosis not present

## 2014-11-21 DIAGNOSIS — G43909 Migraine, unspecified, not intractable, without status migrainosus: Secondary | ICD-10-CM | POA: Diagnosis present

## 2014-11-21 DIAGNOSIS — G43809 Other migraine, not intractable, without status migrainosus: Secondary | ICD-10-CM

## 2014-11-21 DIAGNOSIS — R030 Elevated blood-pressure reading, without diagnosis of hypertension: Secondary | ICD-10-CM

## 2014-11-21 MED ORDER — FLEXERIL 10 MG PO TABS: 10.0000 mg | ORAL_TABLET | Freq: Three times a day (TID) | ORAL | Status: DC | PRN

## 2014-11-21 MED ORDER — PROMETHAZINE HCL 25 MG PO TABS: 25.0000 mg | ORAL_TABLET | Freq: Three times a day (TID) | ORAL | Status: DC | PRN

## 2014-11-21 MED ORDER — IBUPROFEN 400 MG PO TABS: 400.0000 mg | ORAL_TABLET | Freq: Four times a day (QID) | ORAL | Status: DC | PRN

## 2014-11-21 MED ORDER — KETOROLAC TROMETHAMINE 60 MG/2ML IM SOLN: 30.0000 mg | Freq: Once | INTRAMUSCULAR | Status: AC

## 2014-11-21 MED ORDER — KETOROLAC TROMETHAMINE 30 MG/ML IJ SOLN
30.0000 mg | Freq: Once | INTRAMUSCULAR | Status: AC
  Administered 2014-11-21: 30 mg via INTRAMUSCULAR

## 2014-11-21 MED ORDER — SUMATRIPTAN SUCCINATE 50 MG PO TABS: 50.0000 mg | ORAL_TABLET | ORAL | Status: DC | PRN

## 2014-11-21 NOTE — Progress Notes (Signed)
Pre visit review using our clinic review tool, if applicable. No additional management support is needed unless otherwise documented below in the visit note. 
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
## 2014-11-21 NOTE — Patient Instructions (Signed)
Post-Concussion Syndrome Post-concussion syndrome describes the symptoms that can occur after a head injury. These symptoms can last from weeks to months. CAUSES  It is not clear why some head injuries cause post-concussion syndrome. It can occur whether your head injury was mild or severe and whether you were wearing head protection or not.  SIGNS AND SYMPTOMS  Memory difficulties.  Dizziness.  Headaches.  Double vision or blurry vision.  Sensitivity to light.  Hearing difficulties.  Depression.  Tiredness.  Weakness.  Difficulty with concentration.  Difficulty sleeping or staying asleep.  Vomiting.  Poor balance or instability on your feet.  Slow reaction time.  Difficulty learning and remembering things you have heard. DIAGNOSIS  There is no test to determine whether you have post-concussion syndrome. Your health care provider may order an imaging scan of your brain, such as a CT scan, to check for other problems that may be causing your symptoms (such as severe injury inside your skull). TREATMENT  Usually, these problems disappear over time without medical care. Your health care provider may prescribe medicine to help ease your symptoms. It is important to follow up with a neurologist to evaluate your recovery and address any lingering symptoms or issues. HOME CARE INSTRUCTIONS   Only take over-the-counter or prescription medicines for pain, discomfort, or fever as directed by your health care provider. Do not take aspirin. Aspirin can slow blood clotting.  Sleep with your head slightly elevated to help with headaches.  Avoid any situation where there is potential for another head injury (football, hockey, soccer, basketball, martial arts, downhill snow sports, and horseback riding). Your condition will get worse every time you experience a concussion. You should avoid these activities until you are evaluated by the appropriate follow-up health care  providers.  Keep all follow-up appointments as directed by your health care provider. SEEK IMMEDIATE MEDICAL CARE IF:  You develop confusion or unusual drowsiness.  You cannot wake the injured person.  You develop nausea or persistent, forceful vomiting.  You feel like you are moving when you are not (vertigo).  You notice the injured person's eyes moving rapidly back and forth. This may be a sign of vertigo.  You have convulsions or faint.  You have severe, persistent headaches that are not relieved by medicine.  You cannot use your arms or legs normally.  Your pupils change size.  You have clear or bloody discharge from the nose or ears.  Your problems are getting worse, not better. MAKE SURE YOU:  Understand these instructions.  Will watch your condition.  Will get help right away if you are not doing well or get worse. Document Released: 10/21/2001 Document Revised: 02/19/2013 Document Reviewed: 08/06/2013 ExitCare Patient Information 2015 [employer]. This information is not intended to replace advice given to you by your health care provider. Make sure you discuss any questions you have with your health care provider.  

## 2014-11-22 ENCOUNTER — Encounter: Payer: Self-pay | Admitting: Family Medicine

## 2014-11-22 DIAGNOSIS — M542 Cervicalgia: Secondary | ICD-10-CM

## 2014-11-22 HISTORY — DX: Cervicalgia: M54.2
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
## 2014-11-22 NOTE — Assessment & Plan Note (Signed)
Well controlled, no changes to meds. Encouraged heart healthy diet such as the DASH diet and exercise as tolerated.  °

## 2014-11-24 ENCOUNTER — Telehealth: Payer: Self-pay | Admitting: Internal Medicine

## 2014-11-24 NOTE — Telephone Encounter (Signed)
Patient was in on Saturday and Dr. Randel Pigg ordered a brain scan. He was reading up on his results and he is concerned about the part that says "Mild subcortical white matter and periventricular small vessel ischemic changes." He researched this and is now concerned about dementia since his mother had that.  He would like to talk with someone about this. He is aware Dr. Jenny Reichmann is out this week, but if you could give him a call either way.

## 2014-11-24 NOTE — Telephone Encounter (Signed)
Please advise in PCP's absence, thanks! 

## 2014-11-24 NOTE — Telephone Encounter (Signed)
This is a very common finding and not urgent but certainly if he is concerned he should schedule an appt to discuss with PMD and they could consider a neurologic consult if he continues to worry.

## 2014-11-25 NOTE — Telephone Encounter (Signed)
Pt advised and will follow up if needed

## 2014-11-27 ENCOUNTER — Other Ambulatory Visit: Payer: Self-pay | Admitting: Internal Medicine

## 2014-11-30 NOTE — Telephone Encounter (Signed)
Please advise, thanks.

## 2014-12-02 ENCOUNTER — Telehealth: Payer: Self-pay | Admitting: Internal Medicine

## 2014-12-02 NOTE — Telephone Encounter (Signed)
Rx faxed to pharmacy  

## 2014-12-02 NOTE — Telephone Encounter (Signed)
Done hardcopy to Dahlia  

## 2014-12-02 NOTE — Telephone Encounter (Signed)
Patient is calling regarding a refill request for zolpidem (AMBIEN) 10 MG tablet [[id number]  Pharmacy [facility] on [address]

## 2014-12-04 ENCOUNTER — Telehealth: Payer: Self-pay | Admitting: Internal Medicine

## 2014-12-04 NOTE — Telephone Encounter (Signed)
[facility]  Patient Name: Peter [name]  DOB: 1955-08-29    Initial Comment Caller states he was constipated and has possible hemorrhoid (about the size of a marble). He is using suppositories and cream; it is not getting better. Concerned about MRSA.    Nurse Assessment  Nurse: Justine Null, RN, Rodena Piety Date/Time (Eastern Time): 12/04/2014 3:25:59 PM  Confirm and document reason for call. If symptomatic, describe symptoms. ---Caller states he was constipated and has possible hemorrhoid (about the size of a marble). He is using suppositories and cream; it is not getting better. Concerned about MRSA. caller stated that he has been having discomfort when he sits down and has been having no bleeding from the hemorrhoid and has been using the suppositories and has been using the Preparation H and the Hydrocortisone cream and has been using no sitz baths  Has the patient traveled out of the country within the last 30 days? ---No  Does the patient require triage? ---Yes  Related visit to physician within the last 2 weeks? ---Yes  Does the PT have any chronic conditions? (i.e. diabetes, asthma, etc.) ---No     Guidelines    Guideline Title Affirmed Question Affirmed Notes  Rectal Symptoms MODERATE-SEVERE rectal pain (i.e., interferes with school, work, or sleep)    Final Disposition User   See Physician within [place], RN, [facility]   Disagree/Comply: Comply

## 2014-12-07 NOTE — Telephone Encounter (Signed)
LVM for pt to call back as soon as possible.   

## 2014-12-09 NOTE — Telephone Encounter (Signed)
Patient returned your call. If he doesn't answer leave detailed message and he will call back on his break

## 2014-12-09 NOTE — Telephone Encounter (Signed)
Pt answered but needs to call back. Pt is going into a meeting.

## 2014-12-13 ENCOUNTER — Emergency Department (HOSPITAL_COMMUNITY): Payer: Worker's Compensation

## 2014-12-13 ENCOUNTER — Emergency Department (HOSPITAL_COMMUNITY)
Admission: EM | Admit: 2014-12-13 | Discharge: 2014-12-14 | Disposition: A | Discharge status: Home / Self Care | Payer: Worker's Compensation | Attending: Emergency Medicine | Admitting: Emergency Medicine

## 2014-12-13 ENCOUNTER — Encounter (HOSPITAL_COMMUNITY): Payer: Self-pay | Admitting: Emergency Medicine

## 2014-12-13 DIAGNOSIS — R112 Nausea with vomiting, unspecified: Secondary | ICD-10-CM | POA: Diagnosis present

## 2014-12-13 DIAGNOSIS — G43909 Migraine, unspecified, not intractable, without status migrainosus: Secondary | ICD-10-CM | POA: Diagnosis not present

## 2014-12-13 DIAGNOSIS — Z79899 Other long term (current) drug therapy: Secondary | ICD-10-CM | POA: Diagnosis not present

## 2014-12-13 DIAGNOSIS — F419 Anxiety disorder, unspecified: Secondary | ICD-10-CM | POA: Diagnosis not present

## 2014-12-13 LAB — CBC WITH DIFFERENTIAL/PLATELET
Basophils Absolute: 0 10*3/uL (ref 0.0–0.1)
Basophils Relative: 0 % (ref 0–1)
Eosinophils Absolute: 0.1 10*3/uL (ref 0.0–0.7)
Eosinophils Relative: 1 % (ref 0–5)
HCT: 36.6 % — ABNORMAL LOW (ref 39.0–52.0)
Hemoglobin: 12.1 g/dL — ABNORMAL LOW (ref 13.0–17.0)
Lymphocytes Relative: 25 % (ref 12–46)
Lymphs Abs: 2.3 10*3/uL (ref 0.7–4.0)
MCH: 28.3 pg (ref 26.0–34.0)
MCHC: 33.1 g/dL (ref 30.0–36.0)
MCV: 85.5 fL (ref 78.0–100.0)
Monocytes Absolute: 0.9 10*3/uL (ref 0.1–1.0)
Monocytes Relative: 10 % (ref 3–12)
Neutro Abs: 5.7 10*3/uL (ref 1.7–7.7)
Neutrophils Relative %: 64 % (ref 43–77)
PLATELETS: 246 10*3/uL (ref 150–400)
RBC: 4.28 MIL/uL (ref 4.22–5.81)
RDW: 18.1 % — ABNORMAL HIGH (ref 11.5–15.5)
WBC: 9 10*3/uL (ref 4.0–10.5)

## 2014-12-13 LAB — BASIC METABOLIC PANEL
Anion gap: 7 (ref 5–15)
BUN: 8 mg/dL (ref 6–20)
CHLORIDE: 107 mmol/L (ref 101–111)
CO2: 27 mmol/L (ref 22–32)
Calcium: 8.6 mg/dL — ABNORMAL LOW (ref 8.9–10.3)
Creatinine, Ser: 0.91 mg/dL (ref 0.61–1.24)
GLUCOSE: 92 mg/dL (ref 65–99)
Potassium: 4.2 mmol/L (ref 3.5–5.1)
SODIUM: 141 mmol/L (ref 135–145)

## 2014-12-13 LAB — TROPONIN I: Troponin I: <0.03 ng/mL (ref <0.031)

## 2014-12-13 MED ORDER — KETOROLAC TROMETHAMINE 30 MG/ML IJ SOLN
30.0000 mg | Freq: Once | INTRAMUSCULAR | Status: AC
  Administered 2014-12-13: 30 mg via INTRAVENOUS
  Filled 2014-12-13: qty 1

## 2014-12-13 MED ORDER — IOHEXOL 350 MG/ML SOLN
80.0000 mL | Freq: Once | INTRAVENOUS | Status: AC | PRN
  Administered 2014-12-13: 100 mL via INTRAVENOUS

## 2014-12-13 MED ORDER — DIPHENHYDRAMINE HCL 50 MG/ML IJ SOLN
25.0000 mg | Freq: Once | INTRAMUSCULAR | Status: AC
  Administered 2014-12-13: 25 mg via INTRAVENOUS
  Filled 2014-12-13: qty 1

## 2014-12-13 MED ORDER — PROCHLORPERAZINE EDISYLATE 5 MG/ML IJ SOLN
10.0000 mg | Freq: Once | INTRAMUSCULAR | Status: AC
  Administered 2014-12-13: 10 mg via INTRAVENOUS
  Filled 2014-12-13: qty 2

## 2014-12-13 NOTE — ED Notes (Signed)
Pt here from work. 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 Pt reports sudden onset HA, N/v, diaphoresis, leg cramps. EMS reports positive orthostatic changes. Co-worker found pt in bathroom vomiting and got him ice to cool him down. Pt a/o x 4. Only complaint is HA at this time. EMS gave 4mg  zofran PTA.

## 2014-12-13 NOTE — ED Provider Notes (Addendum)
CSN: [id number]     Arrival date & time 12/13/14  1907 History   First MD Initiated Contact with Patient 12/13/14 1948     Chief Complaint  Patient presents with  . Heat Exposure     (Consider location/radiation/quality/duration/timing/severity/associated sxs/prior Treatment) HPI Comments: 59 year old male with a history of anxiety/depression, migraines who presents with chest pain, lightheadedness, nausea. Patient states that this afternoon just prior to arrival, he had been at work outside all day showing cars. It was very hot outside and towards the end of his work day he began feeling lightheaded and nauseated. He began vomiting and started having a stabbing pain in his central chest that radiates to his back. He continued to vomit and had a brief moment of loss of consciousness while he was sitting on the bathroom floor next to the toilet. His coworkers noted that he was pale, sweaty. He began having a gradual onset of head pressure that has now developed into severe headache, similar to previous migraines. He has had some mild intermittent blurry vision. He received antibiotics during EMS transport and reports that his nausea is now mild. His stabbing chest pain lasted approximately 10 minutes, after which it dulled and is now mild but still present. He does feel mildly short of breath. He denies any abdominal pain, fevers, or recent illness.  The history is provided by the patient.    Past Medical History  Diagnosis Date  . Anxiety state, unspecified 12/30/2012  . Depression   . Migraine   . Neck pain 11/22/2014   Past Surgical History  Procedure Laterality Date  . Right hip tha  2002  . Right hip revision  2007  . Tumor removed     Family History  Problem Relation Age of Onset  . Alcoholism    . Arthritis    . Lung cancer    . Heart disease    . Stroke    . Hypertension     History  Substance Use Topics  . Smoking status: Never Smoker   . Smokeless tobacco: Not on file   . Alcohol Use: Yes     Comment: occ wine    Review of Systems  10 Systems reviewed and are negative for acute change except as noted in the HPI.   Allergies  Lunesta and Demerol  Home Medications   Prior to Admission medications   Medication Sig Start Date End Date Taking? Authorizing Provider  b complex vitamins tablet Take 1 tablet by mouth daily.   Yes Historical Provider, MD  clonazePAM (KLONOPIN) 0.5 MG tablet TAKE 1 TABLET TWICE A DAY AS NEEDED FOR ANXIETY Patient taking differently: Take 0.5 mg by mouth 2 (two) times daily as needed for anxiety.  11/04/14  Yes Biagio Borg, MD  FLEXERIL 10 MG tablet Take 1 tablet (10 mg total) by mouth 3 (three) times daily as needed for muscle spasms. Patient taking differently: Take 10 mg by mouth at bedtime as needed (migraines).  11/21/14  Yes Mosie Lukes, MD  hydrOXYzine (ATARAX/VISTARIL) 25 MG tablet TAKE 1 TABLET BY MOUTH 3 TIMES DAILY AS NEEDED FOR ANXIETY Patient taking differently: TAKE 1 TABLET BY MOUTH DAILY AS NEEDED FOR ANXIETY 07/16/14  Yes Biagio Borg, MD  ibuprofen (ADVIL,MOTRIN) 400 MG tablet Take 1 tablet (400 mg total) by mouth every 6 (six) hours as needed for headache or moderate pain (with pain). Patient taking differently: Take 400 mg by mouth every 6 (six) hours as needed (migraine).  11/21/14  Yes Mosie Lukes, MD  Menthol, Topical Analgesic, (BIOFREEZE ROLL-ON) 4 % GEL Apply 1 application topically at bedtime as needed (back pain).   Yes Historical Provider, MD  Multiple Vitamin (MULTIVITAMIN WITH MINERALS) TABS tablet Take 1 tablet by mouth daily.   Yes Historical Provider, MD  omeprazole (PRILOSEC OTC) 20 MG tablet Take 20 mg by mouth daily.   Yes Historical Provider, MD  promethazine (PHENERGAN) 25 MG tablet Take 1 tablet (25 mg total) by mouth every 8 (eight) hours as needed for nausea or vomiting. 11/21/14  Yes Mosie Lukes, MD  Tetrahydrozoline HCl (VISINE OP) Place 1 drop into both eyes daily as needed (dry eyes/  itching).   Yes Historical Provider, MD  zolpidem (AMBIEN) 10 MG tablet TAKE 1 TABLET AT BEDTIME AS NEEDED FOR SLEEP 12/02/14  Yes Biagio Borg, MD  SUMAtriptan (IMITREX) 100 MG tablet Take 1 tablet (100 mg total) by mouth every 2 (two) hours as needed for migraine or headache. May repeat in 2 hours if headache persists or recurs. Patient not taking: Reported on 12/13/2014 06/11/14   Biagio Borg, MD  SUMAtriptan (IMITREX) 50 MG tablet Take 1 tablet (50 mg total) by mouth every 2 (two) hours as needed for migraine. May repeat in 2 hours if headache persists or recurs. Patient not taking: Reported on 12/13/2014 11/21/14   Mosie Lukes, MD   BP 137/90 mmHg  Pulse 67  Temp(Src) 98.7 F (37.1 C) (Oral)  Resp 16  Ht 5\' 8"  (1.727 m)  Wt 182 lb (82.555 kg)  BMI 27.68 kg/m2  SpO2 100% Physical Exam  Constitutional: He is oriented to person, place, and time. He appears well-developed and well-nourished. No distress.  HENT:  Head: Normocephalic and atraumatic.  Mildly dry mucous membranes  Eyes: Conjunctivae are normal. Pupils are equal, round, and reactive to light.  Neck: Neck supple.  Cardiovascular: Normal rate, regular rhythm, normal heart sounds and intact distal pulses.   No murmur heard. Pulmonary/Chest: Effort normal and breath sounds normal.  Abdominal: Soft. Bowel sounds are normal. He exhibits no distension. There is no tenderness.  Musculoskeletal: He exhibits no edema.  Neurological: He is alert and oriented to person, place, and time.  Fluent speech  Skin: Skin is warm and dry.  Psychiatric: He has a normal mood and affect. Judgment normal.  pleasant  Nursing note and vitals reviewed.   ED Course  Procedures (including critical care time) Labs Review Labs Reviewed  BASIC METABOLIC PANEL - Abnormal; Notable for the following:    Calcium 8.6 (*)    All other components within normal limits  CBC WITH DIFFERENTIAL/PLATELET - Abnormal; Notable for the following:    Hemoglobin  12.1 (*)    HCT 36.6 (*)    RDW 18.1 (*)    All other components within normal limits  TROPONIN I  I-STAT TROPOININ, ED    Imaging Review Dg Chest 2 View  12/13/2014   CLINICAL DATA:  Chest pain radiating to the back.  Vomiting.  EXAM: CHEST  2 VIEW  COMPARISON:  09/23/2013  FINDINGS: The cardiomediastinal contours are normal. The lungs are clear. Pulmonary vasculature is normal. No consolidation, pleural effusion, or pneumothorax. No acute osseous abnormalities are seen. Mild degenerative change in the thoracic spine.  IMPRESSION: No acute pulmonary process.   Electronically Signed   By: Jeb Levering M.D.   On: 12/13/2014 20:59   Ct Angio Chest Aorta W/cm &/or Wo/cm  12/13/2014   CLINICAL  DATA:  Chest pain radiating to the back. Sudden onset of headache, nausea vomiting and diaphoresis. Clinical concern for aortic dissection.  EXAM: CT ANGIOGRAPHY CHEST WITH CONTRAST  TECHNIQUE: Multidetector CT imaging of the chest was performed using the standard protocol during bolus administration of intravenous contrast. Multiplanar CT image reconstructions and MIPs were obtained to evaluate the vascular anatomy.  CONTRAST:  139mL OMNIPAQUE IOHEXOL 350 MG/ML SOLN  COMPARISON:  Radiograph earlier this day.  FINDINGS: Normal caliber thoracic aorta without dissection, hematoma, or aneurysm. Common origin of the brachiocephalic and left carotid artery, bovine configuration, normal variant. The heart is normal in size. There scattered Coronary artery calcifications. There are no filling defects in the central pulmonary arteries to suggest pulmonary embolus. No pleural or pericardial effusion. No mediastinal or hilar adenopathy.  Mild motion artifact in the lower lobes. Allowing for this, no consolidation, pulmonary nodule or mass. Mild hypoventilatory change at the lung bases.  No acute abnormality in the included upper abdomen.  There is mild degenerative change in the spine without acute osseous abnormality. No  suspicious osseous lesion.  Review of the MIP images confirms the above findings.  IMPRESSION: Normal caliber thoracic aorta without dissection or acute abnormality.  Mild coronary artery calcifications. No acute intrathoracic abnormality.   Electronically Signed   By: Jeb Levering M.D.   On: 12/13/2014 23:11     EKG Interpretation   Date/Time:  Sunday December 13 2014 19:12:12 EDT Ventricular Rate:  85 PR Interval:  160 QRS Duration: 80 QT Interval:  341 QTC Calculation: 405 R Axis:   47 Text Interpretation:  Sinus rhythm Borderline low voltage, extremity leads  No significant change since last tracing Confirmed by LITTLE MD, RACHEL  [phone]) on 12/13/2014 7:48:58 PM      MDM   Final diagnoses:  None  vomiting Diaphoresis Migraine Chest pain Leg cramps   59 year old male who presents by EMS for nausea, vomiting, lightheadedness, and chest pain that began after spending the afternoon outside. Patient uncomfortable but nontoxic in appearance with reassuring vital signs. He reported sudden onset of chest pain during the vomiting  As well as diaphoresis and leg cramps. Obtained labs. Above including serial troponins;  Labs reassuring. EKG on arrival showed no ischemic changes. Chest x-ray with no widened mediastinum or pneumomediastinum to suggest esophageal rupture.  Because of the patient's significant chest pain and radiation to back, obtained his CT of the chest to rule out aortic dissection. CT unremarkable.   Patient  Complaint of migraine headache which was consistent with previous migraines. Gave patient Benadryl and Compazine as well as Toradol. On reexamination, the patient reported that his headache had resolved and  He was chest pain-free. Patient has no risk factors for PE thus I feel this is very unlikely.  Symptoms consistent with heat exhaustion. Patient states that he had an episode similar to this many years ago when he was overheated. His HEART score is </=3 thusI feel he  is safe for d/c home.  I have instructed him to follow up with PCP regarding chest pain and possible need for stress test if symptoms continue.  Sharlett Iles, MD 12/14/14 0153  Sharlett Iles, MD 12/30/14 [phone]

## 2014-12-14 LAB — I-STAT TROPONIN, ED: Troponin i, poc: 0 ng/mL (ref 0.00–0.08)

## 2014-12-16 ENCOUNTER — Ambulatory Visit: Payer: Self-pay | Admitting: Internal Medicine

## 2014-12-17 ENCOUNTER — Ambulatory Visit: Payer: Managed Care, Other (non HMO) | Admitting: Internal Medicine

## 2014-12-18 ENCOUNTER — Other Ambulatory Visit (INDEPENDENT_AMBULATORY_CARE_PROVIDER_SITE_OTHER): Payer: Managed Care, Other (non HMO)

## 2014-12-18 ENCOUNTER — Ambulatory Visit (INDEPENDENT_AMBULATORY_CARE_PROVIDER_SITE_OTHER): Payer: Managed Care, Other (non HMO) | Admitting: Internal Medicine

## 2014-12-18 ENCOUNTER — Other Ambulatory Visit: Payer: Self-pay | Admitting: Internal Medicine

## 2014-12-18 ENCOUNTER — Encounter: Payer: Self-pay | Admitting: Internal Medicine

## 2014-12-18 VITALS — BP 138/88 | HR 87 | Temp 97.8°F | Ht 68.0 in | Wt 183.0 lb

## 2014-12-18 DIAGNOSIS — M2550 Pain in unspecified joint: Secondary | ICD-10-CM | POA: Diagnosis not present

## 2014-12-18 DIAGNOSIS — M353 Polymyalgia rheumatica: Secondary | ICD-10-CM

## 2014-12-18 DIAGNOSIS — G43909 Migraine, unspecified, not intractable, without status migrainosus: Secondary | ICD-10-CM | POA: Diagnosis not present

## 2014-12-18 DIAGNOSIS — R5383 Other fatigue: Secondary | ICD-10-CM

## 2014-12-18 DIAGNOSIS — G43809 Other migraine, not intractable, without status migrainosus: Secondary | ICD-10-CM

## 2014-12-18 DIAGNOSIS — F418 Other specified anxiety disorders: Secondary | ICD-10-CM

## 2014-12-18 LAB — CBC WITH DIFFERENTIAL/PLATELET
BASOS PCT: 0.5 % (ref 0.0–3.0)
Basophils Absolute: 0 10*3/uL (ref 0.0–0.1)
Eosinophils Absolute: 0.3 10*3/uL (ref 0.0–0.7)
Eosinophils Relative: 5.7 % — ABNORMAL HIGH (ref 0.0–5.0)
HCT: 41 % (ref 39.0–52.0)
HEMOGLOBIN: 13.6 g/dL (ref 13.0–17.0)
LYMPHS PCT: 27.4 % (ref 12.0–46.0)
Lymphs Abs: 1.5 10*3/uL (ref 0.7–4.0)
MCHC: 33.1 g/dL (ref 30.0–36.0)
MCV: 85.8 fl (ref 78.0–100.0)
MONO ABS: 0.5 10*3/uL (ref 0.1–1.0)
Monocytes Relative: 9.8 % (ref 3.0–12.0)
NEUTROS ABS: 3 10*3/uL (ref 1.4–7.7)
NEUTROS PCT: 56.6 % (ref 43.0–77.0)
Platelets: 267 10*3/uL (ref 150.0–400.0)
RBC: 4.77 Mil/uL (ref 4.22–5.81)
RDW: 20.1 % — ABNORMAL HIGH (ref 11.5–15.5)
WBC: 5.3 10*3/uL (ref 4.0–10.5)

## 2014-12-18 LAB — BASIC METABOLIC PANEL
BUN: 13 mg/dL (ref 6–23)
CO2: 29 meq/L (ref 19–32)
Calcium: 9.4 mg/dL (ref 8.4–10.5)
Chloride: 104 mEq/L (ref 96–112)
Creatinine, Ser: 0.89 mg/dL (ref 0.40–1.50)
GFR: 93.37 mL/min (ref >60.00)
GLUCOSE: 81 mg/dL (ref 70–99)
POTASSIUM: 4.4 meq/L (ref 3.5–5.1)
SODIUM: 139 meq/L (ref 135–145)

## 2014-12-18 LAB — HEPATIC FUNCTION PANEL
ALBUMIN: 4.4 g/dL (ref 3.5–5.2)
ALT: 18 U/L (ref 0–53)
AST: 23 U/L (ref 0–37)
Alkaline Phosphatase: 102 U/L (ref 39–117)
Bilirubin, Direct: 0.1 mg/dL (ref 0.0–0.3)
Total Bilirubin: 0.4 mg/dL (ref 0.2–1.2)
Total Protein: 7 g/dL (ref 6.0–8.3)

## 2014-12-18 LAB — CARDIAC PANEL
CK MB: 1.9 ng/mL (ref 0.3–4.0)
Relative Index: 1.1 calc (ref 0.0–2.5)
Total CK: 177 U/L (ref 7–232)

## 2014-12-18 LAB — SEDIMENTATION RATE: Sed Rate: 11 mm/hr (ref 0–22)

## 2014-12-18 LAB — RHEUMATOID FACTOR: Rhuematoid fact SerPl-aCnc: <10 IU/mL (ref <=14)

## 2014-12-18 LAB — TSH: TSH: 2.38 u[IU]/mL (ref 0.35–4.50)

## 2014-12-18 MED ORDER — MELOXICAM 15 MG PO TABS: ORAL_TABLET | ORAL | Status: DC

## 2014-12-18 MED ORDER — SUMATRIPTAN SUCCINATE 100 MG PO TABS: 100.0000 mg | ORAL_TABLET | ORAL | Status: DC | PRN

## 2014-12-18 MED ORDER — KETOROLAC TROMETHAMINE 30 MG/ML IM SOLN
30.0000 mg | Freq: Once | INTRAMUSCULAR | Status: AC
  Administered 2014-12-18: 30 mg via INTRAMUSCULAR

## 2014-12-18 NOTE — Assessment & Plan Note (Addendum)
Etiology unclear, for lab as documented, including CK given the polymyalgias, consider referral for OSA rule out, declines at this time  Note:  Total time for pt hx, exam, review of record with pt in the room, determination of diagnoses and plan for further eval and tx is > 40 min, with over 50% spent in coordination and counseling of patient

## 2014-12-18 NOTE — Assessment & Plan Note (Signed)
No effusion, has FROM, for nsaid prn, also esr/rf/ANA

## 2014-12-18 NOTE — Assessment & Plan Note (Signed)
[place] for toradol IM today, then imitrex prn,  to f/u any worsening symptoms or concerns

## 2014-12-18 NOTE — Assessment & Plan Note (Signed)
stable overall by history and exam, recent data reviewed with pt, and pt to continue medical treatment as before,  to f/u any worsening symptoms or concerns Lab Results  Component Value Date   WBC 9.0 12/13/2014   HGB 12.1* 12/13/2014   HCT 36.6* 12/13/2014   PLT 246 12/13/2014   GLUCOSE 92 12/13/2014   ALT 17 09/12/2013   AST 21 09/12/2013   NA 141 12/13/2014   K 4.2 12/13/2014   CL 107 12/13/2014   CREATININE 0.91 12/13/2014   BUN 8 12/13/2014   CO2 27 12/13/2014

## 2014-12-18 NOTE — Addendum Note (Signed)
Addended by: Lyman Bishop on: 12/18/2014 11:03 AM   Modules accepted: Orders

## 2014-12-18 NOTE — Assessment & Plan Note (Signed)
Mod, close to debilitating subjectively, Also for CK - r/o myositis

## 2014-12-18 NOTE — Patient Instructions (Signed)
You had the toradol shot today  Please take all new medication as prescribed - the anti-inflammatory  Please continue all other medications as before, and refills have been done if requested.  Please have the pharmacy call with any other refills you may need.  Please keep your appointments with your specialists as you may have planned  Please go to the LAB in the Basement (turn left off the elevator) for the tests to be done today  You will be contacted by phone if any changes need to be made immediately.  Otherwise, you will receive a letter about your results with an explanation, but please check with MyChart first.  Please remember to sign up for MyChart if you have not done so, as this will be important to you in the future with finding out test results, communicating by private email, and scheduling acute appointments online when needed.

## 2014-12-18 NOTE — Progress Notes (Signed)
Subjective:    Patient ID: Peter [name], male    DOB: 06/18/1955, 59 y.o.   MRN: [id number]  HPI     Here to fu recent ED visit under workmans comp 7/31 with n/v/dizzy/CP/HA c/w heat exhaustion and migraine. No further CP, Is having another migraine today at this time with mod throbbing pain, some nausea, no vomiting.   Also feels whole body is achy and paining in the last 2 mo with muscles and joints though now swelling or overt outward physical signs, does feels clumsy as well Denies worsening depressive symptoms, suicidal ideation, or panic; has ongoing anxiety, not increased recently per pt, Worried today about cobalt deficiency after an abnormal test a few yrs ago, but I have asked him to f/u with ortho about this.  Does c/o ongoing fatigue x months as well, but denies signficant daytime hypersomnolence. Has ongoing migraine twice per wk, and occas blurry vision with this.  Has chronic pain right hip, but left has been achy and limping some recently for some reason.  july 9 cy head neg for acute, CTA chest neg for acute at ED 731 .  Just feels like he has really aged in past 2 mo.  Pt denies fever, wt loss, night sweats, loss of appetite, or other constitutional symptoms  Denies worsening reflux, abd pain, dysphagia, n/v, bowel change or blood. Denies urinary symptoms such as dysuria, frequency, urgency, flank pain, hematuria or n/v, fever, chills.  Has a marble sized hemorrhoid tender x 2 wks Past Medical History  Diagnosis Date  . Anxiety state, unspecified 12/30/2012  . Depression   . Migraine   . Neck pain 11/22/2014   Past Surgical History  Procedure Laterality Date  . Right hip tha  2002  . Right hip revision  2007  . Tumor removed      reports that he has never smoked. He does not have any smokeless tobacco history on file. He reports that he drinks alcohol. He reports that he does not use illicit drugs. family history includes Alcoholism in an other family member; Arthritis in an  other family member; Heart disease in an other family member; Hypertension in an other family member; Lung cancer in an other family member; Stroke in an other family member. Allergies  Allergen Reactions  . Lunesta [Eszopiclone] Anaphylaxis and Other (See Comments)    Metal taste in mouth, lungs filled with fluid  . Demerol [Meperidine] Other (See Comments)    hallucinations   Review of Systems Constitutional: Negative for increased diaphoresis, other activity, appetite or siginficant weight change other than noted HENT: Negative for worsening hearing loss, ear pain, facial swelling, mouth sores and neck stiffness.   Eyes: Negative for other worsening pain, redness or visual disturbance.  Respiratory: Negative for shortness of breath and wheezing  Cardiovascular: Negative for chest pain and palpitations.  Gastrointestinal: Negative for diarrhea, blood in stool, abdominal distention or other pain Genitourinary: Negative for hematuria, flank pain or change in urine volume.  Musculoskeletal: Negative for myalgias or other joint complaints.  Skin: Negative for color change and wound or drainage.  Neurological: Negative for syncope and numbness. other than noted Hematological: Negative for adenopathy. or other swelling Psychiatric/Behavioral: Negative for hallucinations, SI, self-injury, decreased concentration or other worsening agitation.      Objective:   Physical Exam BP 138/88 mmHg  Pulse 87  Temp(Src) 97.8 F (36.6 C) (Oral)  Ht 5\' 8"  (1.727 m)  Wt 183 lb (83.008 kg)  BMI 27.83  kg/m2  SpO2 98% VS noted,  Constitutional: Pt is oriented to person, place, and time. Appears well-developed and well-nourished, in no significant distress Head: Normocephalic and atraumatic.  Right Ear: External ear normal.  Left Ear: External ear normal.  Nose: Nose normal.  Mouth/Throat: Oropharynx is clear and moist.  Eyes: Conjunctivae and EOM are normal. Pupils are equal, round, and reactive to  light.  Neck: Normal range of motion. Neck supple. No JVD present. No tracheal deviation present or significant neck LA or mass Cardiovascular: Normal rate, regular rhythm, normal heart sounds and intact distal pulses.   Pulmonary/Chest: Effort normal and breath sounds without rales or wheezing  Abdominal: Soft. Bowel sounds are normal. NT. No HSM  Musculoskeletal: Normal range of motion. Exhibits no edema.  Lymphadenopathy:  Has no cervical adenopathy.  Neurological: Pt is alert and oriented to person, place, and time. Pt has normal reflexes. No cranial nerve deficit. Motor grossly intact Skin: Skin is warm and dry. No rash noted.  Psychiatric:  Has mild depressed anxious mood and affect. Behavior is normal.     Assessment & Plan:

## 2014-12-18 NOTE — Progress Notes (Signed)
Pre visit review using our clinic review tool, if applicable. No additional management support is needed unless otherwise documented below in the visit note. 

## 2014-12-21 LAB — ANA: Anti Nuclear Antibody(ANA): NEGATIVE

## 2014-12-22 ENCOUNTER — Ambulatory Visit: Payer: Self-pay | Admitting: Family Medicine

## 2014-12-24 ENCOUNTER — Telehealth: Payer: Self-pay | Admitting: *Deleted

## 2014-12-24 DIAGNOSIS — M542 Cervicalgia: Secondary | ICD-10-CM

## 2014-12-24 DIAGNOSIS — G43809 Other migraine, not intractable, without status migrainosus: Secondary | ICD-10-CM

## 2014-12-24 DIAGNOSIS — S0990XS Unspecified injury of head, sequela: Secondary | ICD-10-CM

## 2014-12-24 MED ORDER — IBUPROFEN 400 MG PO TABS: 400.0000 mg | ORAL_TABLET | Freq: Four times a day (QID) | ORAL | Status: DC | PRN

## 2014-12-24 NOTE — Telephone Encounter (Signed)
Left msg on triage stating he has move and change pharmacy needing a refill on his Ibuprofen. [facility] [place]. Called pt back inform him rx was sent to pharmacy also updated new address...Peter [name]

## 2014-12-25 ENCOUNTER — Telehealth: Payer: Self-pay | Admitting: Internal Medicine

## 2014-12-25 NOTE — Telephone Encounter (Signed)
Pt advised via VM 

## 2014-12-25 NOTE — Telephone Encounter (Signed)
Pt called and wants to speak with nurse.  He said that he has got to go to work this weekend but he feels like he is just not ready and can not go out in the heat while taking his meds.  He feels that the heat is effecting him while he is on the medication.  He would like to know if Dr Jenny Reichmann could extend him being out of work for a couple more days?     Call me if you have any questions about this note.

## 2014-12-25 NOTE — Telephone Encounter (Signed)
Unfortunately, I cannot find a good reason to do this, besides his subjective complaints of fatigue.  All lab testing negative for acute, heat exhaustion should be resolved at this time, and medications should not decrease his work ability.  I would say ok to return to work as scheduled

## 2014-12-29 ENCOUNTER — Ambulatory Visit: Payer: Self-pay | Admitting: Internal Medicine

## 2015-01-05 ENCOUNTER — Other Ambulatory Visit: Payer: Self-pay | Admitting: Internal Medicine

## 2015-01-05 ENCOUNTER — Telehealth: Payer: Self-pay | Admitting: Internal Medicine

## 2015-01-05 ENCOUNTER — Other Ambulatory Visit: Payer: Self-pay | Admitting: Family Medicine

## 2015-01-05 DIAGNOSIS — M542 Cervicalgia: Secondary | ICD-10-CM

## 2015-01-05 DIAGNOSIS — G43809 Other migraine, not intractable, without status migrainosus: Secondary | ICD-10-CM

## 2015-01-05 DIAGNOSIS — S0990XS Unspecified injury of head, sequela: Secondary | ICD-10-CM

## 2015-01-05 NOTE — Telephone Encounter (Signed)
OV and labs faxed to [facility] [phone]

## 2015-01-05 NOTE — Telephone Encounter (Signed)
Betsy from [facility] called and is needing patient's diagnosis. Can you call her at [phone] Ref #[id number]

## 2015-01-06 MED ORDER — SUMATRIPTAN SUCCINATE 50 MG PO TABS: 50.0000 mg | ORAL_TABLET | ORAL | Status: DC | PRN

## 2015-01-06 NOTE — Telephone Encounter (Signed)
Ok to refill 

## 2015-01-06 NOTE — Telephone Encounter (Signed)
This came to Dr Charlett Blake by mistake.  Pt sees Dr Jenny Reichmann. Thanks!

## 2015-01-15 DIAGNOSIS — Z0279 Encounter for issue of other medical certificate: Secondary | ICD-10-CM

## 2015-01-20 ENCOUNTER — Ambulatory Visit: Payer: Self-pay | Admitting: Neurology

## 2015-01-31 ENCOUNTER — Other Ambulatory Visit: Payer: Self-pay | Admitting: Internal Medicine

## 2015-02-02 NOTE — Telephone Encounter (Signed)
Done hardcopy to Dahlia  

## 2015-02-02 NOTE — Telephone Encounter (Signed)
[facility].../[facility]

## 2015-04-28 ENCOUNTER — Other Ambulatory Visit: Payer: Self-pay | Admitting: Internal Medicine

## 2015-04-29 ENCOUNTER — Other Ambulatory Visit: Payer: Self-pay | Admitting: Internal Medicine

## 2015-05-03 ENCOUNTER — Other Ambulatory Visit: Payer: Self-pay | Admitting: Internal Medicine

## 2015-05-04 NOTE — Telephone Encounter (Signed)
Done hardcopy to Dahlia  

## 2015-05-04 NOTE — Telephone Encounter (Signed)
Rx faxed to pharmacy  

## 2015-05-13 ENCOUNTER — Other Ambulatory Visit: Payer: Self-pay | Admitting: Internal Medicine

## 2015-05-13 NOTE — Telephone Encounter (Signed)
Done hardcopy to Dahlia  

## 2015-05-14 NOTE — Telephone Encounter (Signed)
Rx faxed to pharmacy  

## 2015-05-27 ENCOUNTER — Telehealth: Payer: Self-pay | Admitting: Internal Medicine

## 2015-05-27 NOTE — Telephone Encounter (Signed)
Pt doesn't have insurance any longer Please advise [facility] [address] Ok to leave detailed msg

## 2015-05-27 NOTE — Telephone Encounter (Signed)
Please advise pt to contact insurance company since this is not because of how the prescription was written but how much of the medication the insurance company covers, thanks!

## 2015-05-27 NOTE — Telephone Encounter (Signed)
Pt called in stating on his prescription for zolpidem (AMBIEN) 10 MG tablet [id number] he was only given 15 pills on his last refill the end of December. I see on his prescription it was for 30. He's needing another refill and the pharmacy is telling him to call us.

## 2015-05-27 NOTE — Telephone Encounter (Signed)
I contact pt's pharmacy and Ambien Rx was refilled for #30 with a copay of $10+. Pt advised via VM

## 2015-05-27 NOTE — Telephone Encounter (Signed)
Error

## 2015-05-27 NOTE — Telephone Encounter (Signed)
lmovm to call back.

## 2015-06-26 ENCOUNTER — Encounter: Payer: Self-pay | Admitting: Internal Medicine

## 2015-07-09 ENCOUNTER — Emergency Department (INDEPENDENT_AMBULATORY_CARE_PROVIDER_SITE_OTHER)
Admission: EM | Admit: 2015-07-09 | Discharge: 2015-07-09 | Disposition: A | Discharge status: Home / Self Care | Payer: Self-pay | Source: Home / Self Care | Attending: Internal Medicine | Admitting: Internal Medicine

## 2015-07-09 ENCOUNTER — Encounter (HOSPITAL_COMMUNITY): Payer: Self-pay | Admitting: *Deleted

## 2015-07-09 DIAGNOSIS — S76012D Strain of muscle, fascia and tendon of left hip, subsequent encounter: Secondary | ICD-10-CM

## 2015-07-09 MED ORDER — DICLOFENAC SODIUM 1 % TD GEL: 2.0000 g | Freq: Four times a day (QID) | TRANSDERMAL | Status: DC

## 2015-07-09 MED ORDER — NAPROXEN 500 MG PO TABS: 500.0000 mg | ORAL_TABLET | Freq: Two times a day (BID) | ORAL | Status: DC

## 2015-07-09 MED ORDER — OMEPRAZOLE 40 MG PO CPDR: 40.0000 mg | DELAYED_RELEASE_CAPSULE | Freq: Two times a day (BID) | ORAL | Status: DC

## 2015-07-09 NOTE — Discharge Instructions (Signed)
Try swimming as an alternate physical activity while recuperating from L hip strain and troubleshooting L hip arthritis with Dr Mayer Camel. Prescriptions for naprosyn and prilosec (to protect stomach) [facility] [address]  Prescription for voltaren gel also sent, but if too expensive try naprosyn instead. Keep followup with Dr Mayer Camel as scheduled 07/20/15.

## 2015-07-09 NOTE — ED Notes (Signed)
l  Hip  Pain          Little    Over   2  Weeks  Ago  [place]  Was  Seen  At  [facility]       Had  X  Ray        No  fx         But  Pt  States  Bone  On bone      Pain on  Weight  Bearing    scale  Of  10       Pt  Has  appt  With  Dr    Hal Morales  In about  2  Weeks

## 2015-07-09 NOTE — ED Provider Notes (Addendum)
CSN: [id number]     Arrival date & time 07/09/15  1421 History   First MD Initiated Contact with Patient 07/09/15 1531     Chief Complaint  Patient presents with  . Hip Pain   HPI Patient is a 60 year old gentleman with past medical history notable for right hip arthritis, with hip replacement and right hip revision. He slipped on his front porch a couple weeks ago, and has had increased pain in the left anterior hip since. Movement increases this. He has not been able to bicycle for exercise because of pain symptoms. He was seen in the [facility] emergency room, where x-ray showed severe arthritis, but no fracture reportedly. He was given prescriptions for oxycodone, prednisone, and has been taking ibuprofen over-the-counter. Having a little bit of stomach upset with the ibuprofen. He will be following up with his orthopedist, Dr. Mayer Camel, on March 7. He is seeking symptom relief until that time.  Past Medical History  Diagnosis Date  . Anxiety state, unspecified 12/30/2012  . Depression   . Migraine   . Neck pain 11/22/2014   Past Surgical History  Procedure Laterality Date  . Right hip tha  2002  . Right hip revision  2007  . Tumor removed     Family History  Problem Relation Age of Onset  . Alcoholism    . Arthritis    . Lung cancer    . Heart disease    . Stroke    . Hypertension     Social History  Substance Use Topics  . Smoking status: Never Smoker   . Smokeless tobacco: None  . Alcohol Use: Yes     Comment: occ wine    Review of Systems  All other systems reviewed and are negative.   Allergies  Lunesta and Demerol  Home Medications   Prior to Admission medications   Medication Sig Start Date End Date Taking? Authorizing Provider  b complex vitamins tablet Take 1 tablet by mouth daily.    Historical Provider, MD  clonazePAM (KLONOPIN) 0.5 MG tablet TAKE 1 TABLET BY MOUTH TWICE A DAY AS NEEDED FOR ANXIETY 05/04/15   Biagio Borg, MD  diclofenac sodium  (VOLTAREN) 1 % GEL Apply 2 g topically 4 (four) times daily. 07/09/15   Sherlene Shams, MD  FLEXERIL 10 MG tablet Take 1 tablet (10 mg total) by mouth 3 (three) times daily as needed for muscle spasms. Patient taking differently: Take 10 mg by mouth at bedtime as needed (migraines).  11/21/14   Mosie Lukes, MD  hydrOXYzine (ATARAX/VISTARIL) 25 MG tablet TAKE 1 TABLET BY MOUTH 3 TIMES DAILY AS NEEDED FOR ANXIETY 12/18/14   Biagio Borg, MD  ibuprofen (ADVIL,MOTRIN) 400 MG tablet TAKE 1 TABLET BY MOUTH EVERY 6 HOURS AS NEEDED FOR HEADACHE OR MODERATE PAIN (WITH PAIN) 01/05/15   Biagio Borg, MD  meloxicam [facility]) 15 MG tablet 1  By mouth per day as needed for pain 12/18/14   Biagio Borg, MD  Menthol, Topical Analgesic, (BIOFREEZE ROLL-ON) 4 % GEL Apply 1 application topically at bedtime as needed (back pain).    Historical Provider, MD  Multiple Vitamin (MULTIVITAMIN WITH MINERALS) TABS tablet Take 1 tablet by mouth daily.    Historical Provider, MD  naproxen (NAPROSYN) 500 MG tablet Take 1 tablet (500 mg total) by mouth 2 (two) times daily. 07/09/15   Sherlene Shams, MD  omeprazole (PRILOSEC OTC) 20 MG tablet Take 20 mg by mouth daily.  Historical Provider, MD  omeprazole (PRILOSEC) 40 MG capsule Take 1 capsule (40 mg total) by mouth 2 (two) times daily. 07/09/15   Sherlene Shams, MD  promethazine (PHENERGAN) 25 MG tablet Take 1 tablet (25 mg total) by mouth every 8 (eight) hours as needed for nausea or vomiting. 11/21/14   Mosie Lukes, MD  SUMAtriptan (IMITREX) 100 MG tablet Take 1 tablet (100 mg total) by mouth every 2 (two) hours as needed for migraine or headache. May repeat in 2 hours if headache persists or recurs. 12/18/14   Biagio Borg, MD  SUMAtriptan (IMITREX) 50 MG tablet Take 1 tablet (50 mg total) by mouth every 2 (two) hours as needed for migraine. May repeat in 2 hours if headache persists or recurs. 01/06/15   Biagio Borg, MD  Tetrahydrozoline HCl (VISINE OP) Place 1 drop into both eyes  daily as needed (dry eyes/ itching).    Historical Provider, MD  zolpidem (AMBIEN) 10 MG tablet TAKE 1 TABLET BY MOUTH AT BEDTIME AS NEEDED FOR SLEEP 05/13/15   Biagio Borg, MD      BP 150/96 mmHg  Pulse 78  Temp(Src) 98.6 F (37 C) (Oral)  Resp 18  SpO2 100%   Physical Exam  Constitutional: He is oriented to person, place, and time. No distress.  Alert, nicely groomed  HENT:  Head: Atraumatic.  Eyes:  Conjugate gaze, no eye redness/drainage  Neck: Neck supple.  Cardiovascular: Normal rate.   Pulmonary/Chest: No respiratory distress.  Abdominal: He exhibits no distension.  Musculoskeletal: Normal range of motion.  Range of motion of the left hip, active and passive, is painful, particularly external rotation and flexion past 90.  Neurological: He is alert and oriented to person, place, and time.  Skin: Skin is warm and dry.  No cyanosis  Nursing note and vitals reviewed.   ED Course  Procedures (including critical care time) None  MDM   1. Hip strain, left, subsequent encounter    Meds ordered this encounter  Medications  . naproxen (NAPROSYN) 500 MG tablet    Sig: Take 1 tablet (500 mg total) by mouth 2 (two) times daily.    Dispense:  30 tablet    Refill:  0  . omeprazole (PRILOSEC) 40 MG capsule    Sig: Take 1 capsule (40 mg total) by mouth 2 (two) times daily.    Dispense:  30 capsule    Refill:  0  . diclofenac sodium (VOLTAREN) 1 % GEL    Sig: Apply 2 g topically 4 (four) times daily.    Dispense:  100 g    Refill:  0   Follow-up as planned with orthopedist. If voltaren gel is too expensive, substitute the Naprosyn prescription.    Sherlene Shams, MD 07/09/15 1633  Sherlene Shams, MD 07/11/15 1910

## 2015-07-15 ENCOUNTER — Ambulatory Visit: Payer: Self-pay | Admitting: Family Medicine

## 2015-08-02 ENCOUNTER — Other Ambulatory Visit: Payer: Self-pay | Admitting: Internal Medicine

## 2015-08-03 NOTE — Telephone Encounter (Signed)
Medication sent to pharmacy  

## 2015-08-03 NOTE — Telephone Encounter (Signed)
Done Done hardcopy to Corinne  

## 2015-08-03 NOTE — Telephone Encounter (Signed)
Please advise, ok to refill?

## 2015-08-05 ENCOUNTER — Telehealth: Payer: Self-pay | Admitting: Internal Medicine

## 2015-08-05 NOTE — Telephone Encounter (Signed)
[facility]) medication doe not require a PA.  Pt was attempting to fill 1 day early.  Pharmacy will advise pt of same

## 2015-08-05 NOTE — Telephone Encounter (Signed)
Pharmacy told pt that his prescription for clonazePAM (KLONOPIN) 0.5 MG tablet [id number] is needing a PA

## 2015-08-06 ENCOUNTER — Telehealth: Payer: Self-pay | Admitting: Internal Medicine

## 2015-08-06 NOTE — Telephone Encounter (Signed)
noted 

## 2015-08-06 NOTE — Telephone Encounter (Signed)
FYI: Pt called to inform Dr. Jenny Reichmann that he is applying for social security disability and last ov was 12/2014. Not sure if Dr. Jenny Reichmann want to see him back but just to let you know the situation right now.

## 2015-10-29 ENCOUNTER — Telehealth: Payer: Self-pay

## 2015-10-29 MED ORDER — CLONAZEPAM 0.5 MG PO TABS: ORAL_TABLET | ORAL | Status: DC

## 2015-10-29 NOTE — Telephone Encounter (Signed)
Please advise 

## 2015-10-29 NOTE — Telephone Encounter (Signed)
clonazePAM (KLONOPIN) 0.5 MG tablet [id number]  Patient states he will need a refill on this medication. But he also would like to start coming off of the medication. He had went cold [place] and states he ended up in the er from it. Please follow up, thank you.

## 2015-10-29 NOTE — Telephone Encounter (Signed)
Done hardcopy to [facility] to take one per day for the next 2 mo, then stop

## 2015-10-29 NOTE — Telephone Encounter (Signed)
Medication faxed to pharmacy 

## 2015-11-01 ENCOUNTER — Other Ambulatory Visit: Payer: Self-pay | Admitting: Internal Medicine

## 2015-11-01 NOTE — Telephone Encounter (Signed)
MD out of office until Wed 6/21 pls advise on refill...Peter [name]

## 2015-11-01 NOTE — Telephone Encounter (Signed)
OK to fill this prescription with additional refills x0 Sch OV w/Dr Jenny Reichmann Thank you!

## 2015-11-02 ENCOUNTER — Other Ambulatory Visit: Payer: Self-pay | Admitting: Internal Medicine

## 2015-11-02 NOTE — Telephone Encounter (Signed)
Contacted pharmacy and they confirmed that rx was received on Friday and pt picked up rx on Friday.

## 2015-11-10 ENCOUNTER — Other Ambulatory Visit: Payer: Self-pay | Admitting: Internal Medicine

## 2015-11-10 NOTE — Telephone Encounter (Signed)
Done hardcopy to Corinne  

## 2015-11-10 NOTE — Telephone Encounter (Signed)
Please advise 

## 2015-11-11 NOTE — Telephone Encounter (Signed)
Medication refill sent to pharmacy  

## 2015-11-19 ENCOUNTER — Telehealth: Payer: Self-pay | Admitting: Internal Medicine

## 2015-11-19 NOTE — Telephone Encounter (Signed)
OK for this for now,  I gather from note that refill is not needed at this time. thanks

## 2015-11-19 NOTE — Telephone Encounter (Signed)
Pt wants to let Dr. Jenny Reichmann know that he wants to continue to take Clonazepam due to anxiety and hip replacement. He is taking 1 pill a day. FYI

## 2015-11-25 ENCOUNTER — Telehealth: Payer: Self-pay | Admitting: Internal Medicine

## 2015-11-25 NOTE — Telephone Encounter (Signed)
Please advise patient needs to make ROV

## 2015-11-25 NOTE — Telephone Encounter (Signed)
Patient called to advise he is in need of a refill of clonazePAM (KLONOPIN) 0.5 MG tablet [id number] . Verified pharmacy on file is correct. I advised patient that we will most likely need an appointment with his current situation. Below I am going to list some updates...   - patient states that he tried to "cold [place]" his clonazepam, and experienced withdrawal. He went to the ED.   - patient advises that his current dosage of Lorrin Mais is not helping him sleep. I advised that he would probably need an appointment to discuss this with you along with the prior.   - patient states that he is hesitant to schedule an appointment with [place] because he is uninsured and unemployed. I explained the financial assistance process. I am also emailing this to him along with the verbal expectation that he fill it out and turn it in expeditiously.

## 2015-11-27 NOTE — Telephone Encounter (Signed)
Pt notified of Dr. Frederik Pear comments and verbalized understanding, pt advise if sxs are sever over weekend he needs to go back to ER, since we can't fill med over weekend

## 2015-11-27 NOTE — Telephone Encounter (Signed)
Patient called Saturday clinic for Rx clonazePAM. Patient feels like if he doesn't get his medication he will end up in the hospital. I advised patient he will need an OV according to prior note. Patient made an appointment for 7/18. He is taking his last pill today. Patient is requesting enough medication to make it to his visit.

## 2015-11-27 NOTE — Telephone Encounter (Signed)
I cannot refill a controlled substance over the phone, he will need to be seen to get more medication

## 2015-11-30 ENCOUNTER — Ambulatory Visit: Payer: Self-pay | Admitting: Internal Medicine

## 2015-11-30 ENCOUNTER — Telehealth: Payer: Self-pay | Admitting: Internal Medicine

## 2015-11-30 NOTE — Telephone Encounter (Signed)
FYI:  Patient states he wanted to Thank everyone for his appointment scheduled for tomorrow.

## 2015-12-01 ENCOUNTER — Telehealth: Payer: Self-pay | Admitting: Internal Medicine

## 2015-12-01 ENCOUNTER — Ambulatory Visit (INDEPENDENT_AMBULATORY_CARE_PROVIDER_SITE_OTHER): Payer: Medicaid Other | Admitting: Internal Medicine

## 2015-12-01 VITALS — BP 126/78 | HR 88 | Temp 98.6°F | Resp 20 | Wt 176.0 lb

## 2015-12-01 DIAGNOSIS — G4700 Insomnia, unspecified: Secondary | ICD-10-CM

## 2015-12-01 DIAGNOSIS — M25552 Pain in left hip: Secondary | ICD-10-CM | POA: Diagnosis not present

## 2015-12-01 DIAGNOSIS — F411 Generalized anxiety disorder: Secondary | ICD-10-CM | POA: Diagnosis not present

## 2015-12-01 MED ORDER — ZOLPIDEM TARTRATE 10 MG PO TABS: 10.0000 mg | ORAL_TABLET | Freq: Every evening | ORAL | Status: DC | PRN

## 2015-12-01 MED ORDER — CLONAZEPAM 0.5 MG PO TABS: 0.5000 mg | ORAL_TABLET | Freq: Two times a day (BID) | ORAL | Status: DC | PRN

## 2015-12-01 NOTE — Progress Notes (Signed)
Pre visit review using our clinic review tool, if applicable. No additional management support is needed unless otherwise documented below in the visit note. 

## 2015-12-01 NOTE — Telephone Encounter (Signed)
[facility] called and said that pt is trying to refill his ambrein , per pharmacy it was filled on 7/1 for 30 tabs.  He is trying to refill it today.  He told pharmacy that he flushed it down the toilet.  Pharmacy is wanting to know if this can be refilled already?

## 2015-12-01 NOTE — Patient Instructions (Addendum)
/  Please continue all other medications as before, and refills have been done if requested.  Please have the pharmacy call with any other refills you may need.  Please keep your appointments with your specialists as you may have planned  Please return in 1 year for your yearly visit, or sooner if needed 

## 2015-12-01 NOTE — Progress Notes (Signed)
Subjective:    Patient ID: Peter [name], male    DOB: 1956-03-05, 60 y.o.   MRN: [id number]  HPI  Here to f/u; overall doing ok,  Pt denies chest pain, increasing sob or doe, wheezing, orthopnea, PND, increased LE swelling, palpitations, dizziness or syncope.  Pt denies new neurological symptoms such as new headache, or facial or extremity weakness or numbness.  Pt denies polydipsia, polyuria, or low sugar episode.   Pt denies new neurological symptoms such as new headache, or facial or extremity weakness or numbness.   Pt states overall good compliance with meds, mostly trying to follow appropriate diet, with wt overall stable,  but little exercise however, Walking cane, no falls.  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  Unable to see ortho due to lack of insurance, has seen Dr Mayer Camel in the past.   To see disability MD tomorrow.  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  Past Medical History  Diagnosis Date  . Anxiety state, unspecified 12/30/2012  . Depression   . Migraine   . Neck pain 11/22/2014   Past Surgical History  Procedure Laterality Date  . Right hip tha  2002  . Right hip revision  2007  . Tumor removed      reports that he has never smoked. He does not have any smokeless tobacco history on file. He reports that he drinks alcohol. He reports that he does not use illicit drugs. family history is not on file. Allergies  Allergen Reactions  . Lunesta [Eszopiclone] Anaphylaxis and Other (See Comments)    Metal taste in mouth, lungs filled with fluid  . Demerol [Meperidine] Other (See Comments)    hallucinations   Current Outpatient Prescriptions on File Prior to Visit  Medication Sig Dispense Refill  . b complex vitamins tablet Take 1 tablet by mouth daily.    . clonazePAM (KLONOPIN) 0.5 MG tablet Take 1 tablet (0.5 mg total) by mouth 2 (two) times daily as needed for anxiety. ---make office visit with pcp for further refills 60  tablet 0  . diclofenac sodium (VOLTAREN) 1 % GEL Apply 2 g topically 4 (four) times daily. 100 g 0  . hydrOXYzine (ATARAX/VISTARIL) 25 MG tablet TAKE 1 TABLET BY MOUTH 3 TIMES DAILY AS NEEDED FOR ANXIETY 90 tablet 1  . ibuprofen (ADVIL,MOTRIN) 400 MG tablet TAKE 1 TABLET BY MOUTH EVERY 6 HOURS AS NEEDED FOR HEADACHE OR MODERATE PAIN (WITH PAIN) 60 tablet 0  . Menthol, Topical Analgesic, (BIOFREEZE ROLL-ON) 4 % GEL Apply 1 application topically at bedtime as needed (back pain).    . Multiple Vitamin (MULTIVITAMIN WITH MINERALS) TABS tablet Take 1 tablet by mouth daily.    . naproxen (NAPROSYN) 500 MG tablet Take 1 tablet (500 mg total) by mouth 2 (two) times daily. 30 tablet 0  . omeprazole (PRILOSEC OTC) 20 MG tablet Take 20 mg by mouth daily.    Marland Kitchen omeprazole (PRILOSEC) 40 MG capsule Take 1 capsule (40 mg total) by mouth 2 (two) times daily. 30 capsule 0  . promethazine (PHENERGAN) 25 MG tablet Take 1 tablet (25 mg total) by mouth every 8 (eight) hours as needed for nausea or vomiting. 20 tablet 1  . SUMAtriptan (IMITREX) 100 MG tablet Take 1 tablet (100 mg total) by mouth every 2 (two) hours as needed for migraine or headache. May repeat in 2 hours if headache persists or recurs. 10 tablet 5  .  SUMAtriptan (IMITREX) 50 MG tablet Take 1 tablet (50 mg total) by mouth every 2 (two) hours as needed for migraine. May repeat in 2 hours if headache persists or recurs. 10 tablet 0  . zolpidem (AMBIEN) 10 MG tablet TAKE 1 TABLET BY MOUTH AT BEDTIME AS NEEDED FOR SLEEP 30 tablet 2   No current facility-administered medications on file prior to visit.     Review of Systems  Constitutional: Negative for unusual diaphoresis or night sweats HENT: Negative for ear swelling or discharge Eyes: Negative for worsening visual haziness  Respiratory: Negative for choking and stridor.   Gastrointestinal: Negative for distension or worsening eructation Genitourinary: Negative for retention or change in urine  volume.  Musculoskeletal: Negative for other MSK pain or swelling Skin: Negative for color change and worsening wound Neurological: Negative for tremors and numbness other than noted  Psychiatric/Behavioral: Negative for decreased concentration or agitation other than above       Objective:   Physical Exam BP 126/78 mmHg  Pulse 88  Temp(Src) 98.6 F (37 C) (Oral)  Resp 20  Wt 176 lb (79.833 kg)  SpO2 99%\ VS noted,  Constitutional: Pt appears in no apparent distress HENT: Head: NCAT.  Right Ear: External ear normal.  Left Ear: External ear normal.  Eyes: . Pupils are equal, round, and reactive to light. Conjunctivae and EOM are normal Neck: Normal range of motion. Neck supple.  Cardiovascular: Normal rate and regular rhythm.   Pulmonary/Chest: Effort normal and breath sounds without rales or wheezing.  Abd:  Soft, NT, ND, + BS Neurological: Pt is alert. Not confused , motor grossly intact Skin: Skin is warm. No rash, no LE edema Psychiatric: Pt behavior is normal. No agitation. 1-2+ nervous Severe pain on left hip flexion    Assessment & Plan:

## 2015-12-01 NOTE — Telephone Encounter (Signed)
Spoke to Dr. Jenny Reichmann he said it was ok to refill, advised [facility] of this.

## 2015-12-05 NOTE — Assessment & Plan Note (Signed)
Stable, for med refill,  to f/u any worsening symptoms or concerns 

## 2015-12-05 NOTE — Assessment & Plan Note (Signed)
For pain control, ideally should see orthopedic but cannot afford

## 2015-12-05 NOTE — Assessment & Plan Note (Signed)
Stable, for med refills,  to f/u any worsening symptoms or concerns

## 2015-12-10 ENCOUNTER — Telehealth: Payer: Self-pay | Admitting: Internal Medicine

## 2015-12-10 NOTE — Telephone Encounter (Signed)
Patient called to advise that he was able to come across a discount coupon for tramadol. He had previously declined the script from you because of its cost. He is able to get a much better deal with this coupon. He states that he can get 120 qty for $11.76. He requests that we send this in.

## 2015-12-13 MED ORDER — TRAMADOL HCL 50 MG PO TABS: 50.0000 mg | ORAL_TABLET | Freq: Four times a day (QID) | ORAL | 2 refills | Status: DC | PRN

## 2015-12-13 NOTE — Telephone Encounter (Signed)
Done hardcopy to Corinne  

## 2015-12-14 NOTE — Telephone Encounter (Signed)
Medication refills sent to pharmacy 

## 2015-12-20 ENCOUNTER — Telehealth: Payer: Self-pay | Admitting: Internal Medicine

## 2015-12-20 NOTE — Telephone Encounter (Signed)
Rec'd from [facility] forward 7 pages to [place]

## 2016-01-25 ENCOUNTER — Encounter: Payer: Self-pay | Admitting: Internal Medicine

## 2016-01-25 DIAGNOSIS — E785 Hyperlipidemia, unspecified: Secondary | ICD-10-CM

## 2016-02-10 ENCOUNTER — Encounter: Payer: Self-pay | Admitting: Internal Medicine

## 2016-02-10 ENCOUNTER — Other Ambulatory Visit: Payer: Self-pay | Admitting: Orthopedic Surgery

## 2016-02-10 ENCOUNTER — Other Ambulatory Visit: Payer: Self-pay | Admitting: Internal Medicine

## 2016-02-10 ENCOUNTER — Other Ambulatory Visit (INDEPENDENT_AMBULATORY_CARE_PROVIDER_SITE_OTHER): Payer: Medicaid Other

## 2016-02-10 ENCOUNTER — Other Ambulatory Visit: Payer: Self-pay | Admitting: *Deleted

## 2016-02-10 DIAGNOSIS — E785 Hyperlipidemia, unspecified: Secondary | ICD-10-CM | POA: Diagnosis not present

## 2016-02-10 LAB — LIPID PANEL
CHOL/HDL RATIO: 4
CHOLESTEROL: 224 mg/dL — AB (ref 0–200)
HDL: 53.2 mg/dL (ref >39.00)
NonHDL: 171.23
TRIGLYCERIDES: 219 mg/dL — AB (ref 0.0–149.0)
VLDL: 43.8 mg/dL — ABNORMAL HIGH (ref 0.0–40.0)

## 2016-02-10 LAB — LDL CHOLESTEROL, DIRECT: Direct LDL: 146 mg/dL

## 2016-02-10 MED ORDER — LOVASTATIN 40 MG PO TABS: 40.0000 mg | ORAL_TABLET | Freq: Every day | ORAL | 3 refills | Status: DC

## 2016-02-10 MED ORDER — OMEPRAZOLE 20 MG PO CPDR: 20.0000 mg | DELAYED_RELEASE_CAPSULE | Freq: Every day | ORAL | 3 refills | Status: DC

## 2016-02-10 NOTE — Telephone Encounter (Signed)
Pt left msg on triage stating since he has been getting the Pepcid OTC the pharmacy will not use his insurance. Requesting rx to be sent to pharmacy so he can use his insurance. Called pt inform rx has been sent...Johny Chess

## 2016-02-17 ENCOUNTER — Encounter (HOSPITAL_COMMUNITY)
Admission: RE | Admit: 2016-02-17 | Discharge: 2016-02-17 | Disposition: A | Discharge status: Home / Self Care | Payer: Medicaid Other | Source: Ambulatory Visit | Attending: Orthopedic Surgery | Admitting: Orthopedic Surgery

## 2016-02-17 ENCOUNTER — Ambulatory Visit (HOSPITAL_COMMUNITY)
Admission: RE | Admit: 2016-02-17 | Discharge: 2016-02-17 | Disposition: A | Discharge status: Home / Self Care | Payer: Medicaid Other | Source: Ambulatory Visit | Attending: Orthopedic Surgery | Admitting: Orthopedic Surgery

## 2016-02-17 ENCOUNTER — Encounter (HOSPITAL_COMMUNITY): Payer: Self-pay

## 2016-02-17 DIAGNOSIS — Z01812 Encounter for preprocedural laboratory examination: Secondary | ICD-10-CM | POA: Insufficient documentation

## 2016-02-17 DIAGNOSIS — M1612 Unilateral primary osteoarthritis, left hip: Secondary | ICD-10-CM | POA: Insufficient documentation

## 2016-02-17 DIAGNOSIS — Z01818 Encounter for other preprocedural examination: Secondary | ICD-10-CM

## 2016-02-17 DIAGNOSIS — Z01810 Encounter for preprocedural cardiovascular examination: Secondary | ICD-10-CM | POA: Insufficient documentation

## 2016-02-17 HISTORY — DX: Unspecified osteoarthritis, unspecified site: M19.90

## 2016-02-17 HISTORY — DX: Gastro-esophageal reflux disease without esophagitis: K21.9

## 2016-02-17 LAB — CBC WITH DIFFERENTIAL/PLATELET
BASOS ABS: 0.1 10*3/uL (ref 0.0–0.1)
BASOS PCT: 1 %
Eosinophils Absolute: 0.1 10*3/uL (ref 0.0–0.7)
Eosinophils Relative: 2 %
HEMATOCRIT: 44.6 % (ref 39.0–52.0)
HEMOGLOBIN: 14.9 g/dL (ref 13.0–17.0)
Lymphocytes Relative: 31 %
Lymphs Abs: 2 10*3/uL (ref 0.7–4.0)
MCH: 30 pg (ref 26.0–34.0)
MCHC: 33.4 g/dL (ref 30.0–36.0)
MCV: 89.9 fL (ref 78.0–100.0)
MONO ABS: 0.7 10*3/uL (ref 0.1–1.0)
Monocytes Relative: 10 %
NEUTROS ABS: 3.7 10*3/uL (ref 1.7–7.7)
Neutrophils Relative %: 56 %
Platelets: 280 10*3/uL (ref 150–400)
RBC: 4.96 MIL/uL (ref 4.22–5.81)
RDW: 15.1 % (ref 11.5–15.5)
WBC: 6.6 10*3/uL (ref 4.0–10.5)

## 2016-02-17 LAB — URINALYSIS, ROUTINE W REFLEX MICROSCOPIC
BILIRUBIN URINE: NEGATIVE
GLUCOSE, UA: NEGATIVE mg/dL
HGB URINE DIPSTICK: NEGATIVE
Ketones, ur: NEGATIVE mg/dL
LEUKOCYTES UA: NEGATIVE
Nitrite: NEGATIVE
PH: 6 (ref 5.0–8.0)
Protein, ur: NEGATIVE mg/dL
Specific Gravity, Urine: 1.024 (ref 1.005–1.030)

## 2016-02-17 LAB — TYPE AND SCREEN
ABO/RH(D): O POS
ANTIBODY SCREEN: NEGATIVE

## 2016-02-17 LAB — SURGICAL PCR SCREEN
MRSA, PCR: NEGATIVE
Staphylococcus aureus: NEGATIVE

## 2016-02-17 LAB — BASIC METABOLIC PANEL
ANION GAP: 9 (ref 5–15)
BUN: 8 mg/dL (ref 6–20)
CALCIUM: 9.3 mg/dL (ref 8.9–10.3)
CO2: 26 mmol/L (ref 22–32)
Chloride: 101 mmol/L (ref 101–111)
Creatinine, Ser: 0.86 mg/dL (ref 0.61–1.24)
GLUCOSE: 93 mg/dL (ref 65–99)
Potassium: 4 mmol/L (ref 3.5–5.1)
SODIUM: 136 mmol/L (ref 135–145)

## 2016-02-17 LAB — APTT: APTT: 33 s (ref 24–36)

## 2016-02-17 LAB — PROTIME-INR
INR: 0.96
PROTHROMBIN TIME: 12.8 s (ref 11.4–15.2)

## 2016-02-17 LAB — ABO/RH: ABO/RH(D): O POS

## 2016-02-17 NOTE — Progress Notes (Signed)
PCP:Dr. Cathlean Cower @ [facility]  Requesting D/ withdrawal.C summary, CT testing from [facility]: pt. Had Benzodiazepine withdrawal back in June 2017.

## 2016-02-17 NOTE — Pre-Procedure Instructions (Signed)
    Peter [name]  02/17/2016      [id number] [place], [place] - [address] [facility] [place] [address] Phone: [phone] Fax: [phone]    Your procedure is scheduled on Mon. Oct. 16  Report to [facility] Admitting at 12:25 P .M.  Call this number if you have problems the morning of surgery:  [phone]   Remember:  Do not eat food or drink liquids after midnight  Oct. 15  Take these medicines the morning of surgery with A SIP OF WATER: clonazepam (klonopin), omeprazole (prilosec) tramadol if needed              1 week prior to surgery stop aspirin, aleve, naparoxen, motrin, advil, ibuprofen,  Omega 3 krill oil, vitamins and herbal medicines.   Do not wear jewelry.  Do not wear lotions, powders, or cologne, or deoderant.  Do not shave 48 hours prior to surgery.  Men may shave face and neck.  Do not bring valuables to the hospital.  [facility] is not responsible for any belongings or valuables.  Contacts, dentures or bridgework may not be worn into surgery.  Leave your suitcase in the car.  After surgery it may be brought to your room.  For patients admitted to the hospital, discharge time will be determined by your treatment team.  Patients discharged the day of surgery will not be allowed to drive home.    Special instructions:  Review all handouts  Please read over the following fact sheets that you were given. Coughing and Deep Breathing and MRSA Information

## 2016-02-21 ENCOUNTER — Encounter (HOSPITAL_COMMUNITY): Payer: Self-pay

## 2016-02-21 ENCOUNTER — Telehealth: Payer: Self-pay | Admitting: Internal Medicine

## 2016-02-21 DIAGNOSIS — Z1211 Encounter for screening for malignant neoplasm of colon: Secondary | ICD-10-CM

## 2016-02-21 DIAGNOSIS — Z1159 Encounter for screening for other viral diseases: Secondary | ICD-10-CM

## 2016-02-21 NOTE — Telephone Encounter (Signed)
Patient states that he is getting ready for hip surgery scheduled for next week.  States Dr. Jenny Reichmann has written him a new cholesterol medication.  Patient believes it is lovastatin.  Patient has not started this medication.  Wants to know if he should start before this surgery or after. He also wants more information on this medication.   Patient is also wanting to know if he can get an order placed for HEP C for this week?  Patient is also requesting referral for routine colonoscopy.

## 2016-02-22 ENCOUNTER — Encounter: Payer: Self-pay | Admitting: Internal Medicine

## 2016-02-22 NOTE — Telephone Encounter (Signed)
Ok to start after surgury, I dont really have more information except to say this will reduce his LDL and future risk of heart disease and stroke  Hep c is ordered  [facility] for colonscopy referral

## 2016-02-22 NOTE — Progress Notes (Signed)
Anesthesia chart review: Patient is a 60 year old male scheduled for left THA, anterior approach on 02/28/16 by Dr. Mayer Camel. PAT was on 02/17/16, but chart just brought to me for review. Staff was awaiting records from [facility].  History includes nonsmoker, anxiety, depression, migraines, GERD, arthritis, right THA in 2002 with revision 2007, tonsillectomy. He reported hospitalization for benzodiazepine withdrawal in 10/27/15 at [facility]. He had stopped Klonopin on his own 1 week ago after taking for two years. He presented with acute stabbing left chest pain with neck and facial pain, numbness, weakness, and overall feeling anxious. Head CT and CTA chest/abd/pelvis showed no acute findings. His ED discharge diagnosis was withdrawal from benzodiazepine.  PCP is Dr. Cathlean Cower, last visit 12/01/15. He received copies of 10/2015 ED evaluation. He is aware of surgery plans.  Meds include Klonopin, lovastatin (not started yet), melatonin, omega 3 krill oil, Prilosec, Imitrex, tramadol, Ambien.   BP (!) 131/97   Pulse 73   Temp 36.7 C (Oral)   Resp 20   Ht 5' 8.5" (1.74 m)   Wt 174 lb (78.9 kg)   SpO2 100%   BMI 26.07 kg/m    02/17/16 EKG: NSR.  10/27/15 CTA Chest/abd/pelvis [facility]): Impression: 1. Negative for aortic dissection, acute PE, or other acute finding. 2. Atherosclerosis, including aortoiliac and coronary artery disease (scattered coronary calcifications). Please note that although the presence of coronary artery calcium documents the presence of coronary artery disease, the severity of this disease in any potential stenosis cannot be assessed on this non-gated CT examination. Assessment for potential risk factor modification, dietary therapy or pharmacologic therapy may be warranted, if clinically indicated. (Mild coronary calcifications also noted on 12/13/14 chest CTA.)  10/27/15 Head CT [facility]): Impression: 1. No acute intracranial pathology seen on  CT. 2. Mild small vessel ischemic microangiopathy.  02/17/16 CXR: IMPRESSION: No active cardiopulmonary disease.  Preoperative CBC, CMET, PT/PTT, UA WNL. T&S done.   No CV symptoms reported at PAT and denied at July 2017 PCP office visit.  If no acute changes then I would anticipate that he can proceed as planned. Discussed with anesthesiologist Dr. Gifford Shave. Anesthesiologist to evaluation on the day of surgery.  [facility] Stay Center/Anesthesiology Phone [phone] 02/22/2016 5:42 PM

## 2016-02-23 NOTE — Telephone Encounter (Signed)
Unable to reach patient, also unable to leave voicemail on number provided.

## 2016-02-23 NOTE — Telephone Encounter (Signed)
Spoke to patient he is aware of Dr. Jenny Reichmann advisement.

## 2016-02-24 ENCOUNTER — Other Ambulatory Visit: Payer: Worker's Compensation

## 2016-02-24 DIAGNOSIS — Z1159 Encounter for screening for other viral diseases: Secondary | ICD-10-CM

## 2016-02-24 NOTE — H&P (Signed)
TOTAL HIP ADMISSION H&P  Patient is admitted for left total hip arthroplasty.  Subjective:  Chief Complaint: left hip pain  HPI: Peter [name], 60 y.o. male, has a history of pain and functional disability in the left hip(s) due to arthritis and patient has failed non-surgical conservative treatments for greater than 12 weeks to include NSAID's and/or analgesics, flexibility and strengthening excercises, weight reduction as appropriate and activity modification.  Onset of symptoms was gradual starting 2 years ago with gradually worsening course since that time.The patient noted no past surgery on the left hip(s).  Patient currently rates pain in the left hip at 10 out of 10 with activity. Patient has night pain, worsening of pain with activity and weight bearing, trendelenberg gait, pain that interfers with activities of daily living and pain with passive range of motion. Patient has evidence of joint space narrowing by imaging studies. This condition presents safety issues increasing the risk of falls.   There is no current active infection.  Patient Active Problem List   Diagnosis Date Noted  . Left hip pain 12/01/2015  . Fatigue 12/18/2014  . Polyarthralgia 12/18/2014  . Polymyalgia ([place]) 12/18/2014  . Neck pain 11/22/2014  . Migraine 06/11/2014  . Elevated blood pressure 06/11/2014  . Erectile dysfunction 05/20/2014  . Depression with anxiety 07/17/2013  . Chronic pain of right hip 07/17/2013  . Anxiety state 12/30/2012  . Insomnia 12/30/2012  . Preventative health care 12/30/2012   Past Medical History:  Diagnosis Date  . Anxiety state, unspecified 12/30/2012  . Arthritis   . Depression   . GERD (gastroesophageal reflux disease)   . Migraine   . Neck pain 11/22/2014    Past Surgical History:  Procedure Laterality Date  . right hip revision  2007  . right hip THA  2002  . TONSILLECTOMY    . tumor removed      No prescriptions prior to admission.   Allergies  Allergen  Reactions  . Lunesta [Eszopiclone] Anaphylaxis and Other (See Comments)    Metal taste in mouth, lungs filled with fluid  . Demerol [Meperidine] Other (See Comments)    hallucinations    Social History  Substance Use Topics  . Smoking status: Never Smoker  . Smokeless tobacco: Never Used  . Alcohol use Yes     Comment: occ wine    Family History  Problem Relation Age of Onset  . Alcoholism    . Arthritis    . Lung cancer    . Heart disease    . Stroke    . Hypertension       Review of Systems  Constitutional: Positive for malaise/fatigue.  HENT: Positive for tinnitus.   Eyes: Negative.   Respiratory: Negative.   Cardiovascular: Negative.   Gastrointestinal: Positive for heartburn.  Genitourinary: Negative.   Musculoskeletal: Positive for joint pain.  Skin: Negative.   Neurological: Positive for headaches.  Endo/Heme/Allergies: Positive for polydipsia.  Psychiatric/Behavioral: Positive for depression. The patient is nervous/anxious and has insomnia.     Objective:  Physical Exam  Constitutional: He is oriented to person, place, and time. He appears well-developed and well-nourished.  HENT:  Head: Normocephalic and atraumatic.  Eyes: Pupils are equal, round, and reactive to light.  Neck: Normal range of motion. Neck supple.  Cardiovascular: Intact distal pulses.   Respiratory: Effort normal.  Musculoskeletal: He exhibits tenderness.  Patient walks with a profound left-sided limp.  Any attempts at internal rotation past -5 causes severe pain. External rotation is  to about 30, foot tap is mildly positive for discomfort.  Skin over the left hip is intact.  There is no swelling or erythema.  Neurological: He is alert and oriented to person, place, and time.  Skin: Skin is warm and dry.  Psychiatric: He has a normal mood and affect. His behavior is normal. Judgment and thought content normal.    Vital signs in last 24 hours:    Labs:   Estimated body mass index  is 26.07 kg/m as calculated from the following:   Height as of 02/17/16: 5' 8.5" (1.74 m).   Weight as of 02/17/16: 78.9 kg (174 lb).   Imaging Review Plain radiographs demonstrate bone-on-bone arthritis on the left side.      Assessment/Plan:  End stage arthritis, left hip(s)  The patient history, physical examination, clinical judgement of the provider and imaging studies are consistent with end stage degenerative joint disease of the left hip(s) and total hip arthroplasty is deemed medically necessary. The treatment options including medical management, injection therapy, arthroscopy and arthroplasty were discussed at length. The risks and benefits of total hip arthroplasty were presented and reviewed. The risks due to aseptic loosening, infection, stiffness, dislocation/subluxation,  thromboembolic complications and other imponderables were discussed.  The patient acknowledged the explanation, agreed to proceed with the plan and consent was signed. Patient is being admitted for inpatient treatment for surgery, pain control, PT, OT, prophylactic antibiotics, VTE prophylaxis, progressive ambulation and ADL's and discharge planning.The patient is planning to be discharged home with home health services

## 2016-02-25 LAB — HEPATITIS C ANTIBODY: HCV Ab: NEGATIVE

## 2016-02-26 DIAGNOSIS — M1612 Unilateral primary osteoarthritis, left hip: Secondary | ICD-10-CM | POA: Diagnosis present

## 2016-02-28 ENCOUNTER — Inpatient Hospital Stay (HOSPITAL_COMMUNITY): Payer: Medicaid Other

## 2016-02-28 ENCOUNTER — Encounter (HOSPITAL_COMMUNITY)
Admission: RE | Disposition: A | Discharge status: Home Health | Payer: Self-pay | Source: Ambulatory Visit | Attending: Orthopedic Surgery

## 2016-02-28 ENCOUNTER — Inpatient Hospital Stay (HOSPITAL_COMMUNITY): Payer: Medicaid Other | Admitting: Anesthesiology

## 2016-02-28 ENCOUNTER — Encounter (HOSPITAL_COMMUNITY): Payer: Self-pay | Admitting: *Deleted

## 2016-02-28 ENCOUNTER — Inpatient Hospital Stay (HOSPITAL_COMMUNITY)
Admission: RE | Admit: 2016-02-28 | Discharge: 2016-03-01 | DRG: 470 | Disposition: A | Discharge status: Home Health | Payer: Medicaid Other | Source: Ambulatory Visit | Attending: Orthopedic Surgery | Admitting: Orthopedic Surgery

## 2016-02-28 ENCOUNTER — Inpatient Hospital Stay (HOSPITAL_COMMUNITY): Payer: Medicaid Other | Admitting: Vascular Surgery

## 2016-02-28 DIAGNOSIS — Z823 Family history of stroke: Secondary | ICD-10-CM

## 2016-02-28 DIAGNOSIS — Z96641 Presence of right artificial hip joint: Secondary | ICD-10-CM | POA: Diagnosis present

## 2016-02-28 DIAGNOSIS — Z419 Encounter for procedure for purposes other than remedying health state, unspecified: Secondary | ICD-10-CM

## 2016-02-28 DIAGNOSIS — M353 Polymyalgia rheumatica: Secondary | ICD-10-CM | POA: Diagnosis present

## 2016-02-28 DIAGNOSIS — G43909 Migraine, unspecified, not intractable, without status migrainosus: Secondary | ICD-10-CM | POA: Diagnosis present

## 2016-02-28 DIAGNOSIS — K219 Gastro-esophageal reflux disease without esophagitis: Secondary | ICD-10-CM | POA: Diagnosis present

## 2016-02-28 DIAGNOSIS — F411 Generalized anxiety disorder: Secondary | ICD-10-CM | POA: Diagnosis present

## 2016-02-28 DIAGNOSIS — Z801 Family history of malignant neoplasm of trachea, bronchus and lung: Secondary | ICD-10-CM

## 2016-02-28 DIAGNOSIS — M1612 Unilateral primary osteoarthritis, left hip: Secondary | ICD-10-CM | POA: Diagnosis present

## 2016-02-28 DIAGNOSIS — M25552 Pain in left hip: Secondary | ICD-10-CM | POA: Diagnosis present

## 2016-02-28 DIAGNOSIS — Z8249 Family history of ischemic heart disease and other diseases of the circulatory system: Secondary | ICD-10-CM

## 2016-02-28 DIAGNOSIS — D62 Acute posthemorrhagic anemia: Secondary | ICD-10-CM | POA: Diagnosis not present

## 2016-02-28 HISTORY — PX: TOTAL HIP ARTHROPLASTY: SHX124

## 2016-02-28 SURGERY — ARTHROPLASTY, HIP, TOTAL, ANTERIOR APPROACH
Anesthesia: Monitor Anesthesia Care | Site: Hip | Laterality: Left

## 2016-02-28 MED ORDER — DEXAMETHASONE SODIUM PHOSPHATE 10 MG/ML IJ SOLN
10.0000 mg | Freq: Once | INTRAMUSCULAR | Status: AC
  Administered 2016-02-29: 10 mg via INTRAVENOUS
  Filled 2016-02-28: qty 1

## 2016-02-28 MED ORDER — BUPIVACAINE LIPOSOME 1.3 % IJ SUSP
INTRAMUSCULAR | Status: DC | PRN
  Administered 2016-02-28: 20 mL

## 2016-02-28 MED ORDER — METHOCARBAMOL 500 MG PO TABS
500.0000 mg | ORAL_TABLET | Freq: Four times a day (QID) | ORAL | Status: DC | PRN
  Administered 2016-02-28 – 2016-03-01 (×4): 500 mg via ORAL
  Filled 2016-02-28 (×4): qty 1

## 2016-02-28 MED ORDER — CEFAZOLIN SODIUM-DEXTROSE 2-4 GM/100ML-% IV SOLN
2.0000 g | INTRAVENOUS | Status: AC
  Administered 2016-02-28: 2 g via INTRAVENOUS

## 2016-02-28 MED ORDER — CLONAZEPAM 0.5 MG PO TABS: 0.5000 mg | ORAL_TABLET | Freq: Two times a day (BID) | ORAL | Status: DC | PRN

## 2016-02-28 MED ORDER — MIDAZOLAM HCL 2 MG/2ML IJ SOLN
INTRAMUSCULAR | Status: AC
  Filled 2016-02-28: qty 2

## 2016-02-28 MED ORDER — HYDROMORPHONE HCL 1 MG/ML IJ SOLN
1.0000 mg | INTRAMUSCULAR | Status: DC | PRN
  Administered 2016-02-28 – 2016-02-29 (×4): 1 mg via INTRAVENOUS
  Filled 2016-02-28 (×3): qty 1

## 2016-02-28 MED ORDER — CHLORHEXIDINE GLUCONATE 4 % EX LIQD: 60.0000 mL | Freq: Once | CUTANEOUS | Status: DC

## 2016-02-28 MED ORDER — TRANEXAMIC ACID 1000 MG/10ML IV SOLN
INTRAVENOUS | Status: DC | PRN
  Administered 2016-02-28: 2000 mg via TOPICAL

## 2016-02-28 MED ORDER — BUPIVACAINE-EPINEPHRINE 0.25% -1:200000 IJ SOLN
INTRAMUSCULAR | Status: DC | PRN
  Administered 2016-02-28: 50 mL

## 2016-02-28 MED ORDER — HYDROMORPHONE HCL 1 MG/ML IJ SOLN
INTRAMUSCULAR | Status: AC
  Administered 2016-02-28: 1 mg via INTRAVENOUS
  Filled 2016-02-28: qty 1

## 2016-02-28 MED ORDER — MENTHOL 3 MG MT LOZG: 1.0000 | LOZENGE | OROMUCOSAL | Status: DC | PRN

## 2016-02-28 MED ORDER — METHOCARBAMOL 500 MG PO TABS
ORAL_TABLET | ORAL | Status: AC
  Filled 2016-02-28: qty 1

## 2016-02-28 MED ORDER — OXYCODONE HCL 5 MG PO TABS
5.0000 mg | ORAL_TABLET | ORAL | Status: DC | PRN
  Administered 2016-02-28 – 2016-03-01 (×9): 10 mg via ORAL
  Filled 2016-02-28 (×8): qty 2

## 2016-02-28 MED ORDER — LACTATED RINGERS IV SOLN
INTRAVENOUS | Status: DC
  Administered 2016-02-28 (×2): via INTRAVENOUS

## 2016-02-28 MED ORDER — METOCLOPRAMIDE HCL 5 MG/ML IJ SOLN: 5.0000 mg | Freq: Three times a day (TID) | INTRAMUSCULAR | Status: DC | PRN

## 2016-02-28 MED ORDER — PANTOPRAZOLE SODIUM 40 MG PO TBEC
40.0000 mg | DELAYED_RELEASE_TABLET | Freq: Every day | ORAL | Status: DC
  Administered 2016-02-29 – 2016-03-01 (×2): 40 mg via ORAL
  Filled 2016-02-28 (×2): qty 1

## 2016-02-28 MED ORDER — FENTANYL CITRATE (PF) 100 MCG/2ML IJ SOLN
INTRAMUSCULAR | Status: AC
  Filled 2016-02-28: qty 2

## 2016-02-28 MED ORDER — PROMETHAZINE HCL 25 MG/ML IJ SOLN
6.2500 mg | INTRAMUSCULAR | Status: DC | PRN
  Administered 2016-02-28: 12.5 mg via INTRAVENOUS

## 2016-02-28 MED ORDER — DOCUSATE SODIUM 100 MG PO CAPS
100.0000 mg | ORAL_CAPSULE | Freq: Two times a day (BID) | ORAL | Status: DC
  Administered 2016-02-29 – 2016-03-01 (×3): 100 mg via ORAL
  Filled 2016-02-28 (×3): qty 1

## 2016-02-28 MED ORDER — PHENYLEPHRINE 40 MCG/ML (10ML) SYRINGE FOR IV PUSH (FOR BLOOD PRESSURE SUPPORT)
PREFILLED_SYRINGE | INTRAVENOUS | Status: AC
  Filled 2016-02-28: qty 10

## 2016-02-28 MED ORDER — OXYCODONE HCL 5 MG PO TABS
ORAL_TABLET | ORAL | Status: AC
  Filled 2016-02-28: qty 2

## 2016-02-28 MED ORDER — DIPHENHYDRAMINE HCL 12.5 MG/5ML PO ELIX: 12.5000 mg | ORAL_SOLUTION | ORAL | Status: DC | PRN

## 2016-02-28 MED ORDER — ACETAMINOPHEN 650 MG RE SUPP: 650.0000 mg | Freq: Four times a day (QID) | RECTAL | Status: DC | PRN

## 2016-02-28 MED ORDER — PHENOL 1.4 % MT LIQD: 1.0000 | OROMUCOSAL | Status: DC | PRN

## 2016-02-28 MED ORDER — BUPIVACAINE IN DEXTROSE 0.75-8.25 % IT SOLN
INTRATHECAL | Status: DC | PRN
  Administered 2016-02-28: 2 mL via INTRATHECAL

## 2016-02-28 MED ORDER — ZOLPIDEM TARTRATE 5 MG PO TABS
10.0000 mg | ORAL_TABLET | Freq: Every evening | ORAL | Status: DC | PRN
  Administered 2016-02-29 (×2): 10 mg via ORAL
  Filled 2016-02-28 (×2): qty 2

## 2016-02-28 MED ORDER — EPHEDRINE SULFATE 50 MG/ML IJ SOLN
INTRAMUSCULAR | Status: DC | PRN
  Administered 2016-02-28: 5 mg via INTRAVENOUS

## 2016-02-28 MED ORDER — SODIUM CHLORIDE 0.9 % IV SOLN
2000.0000 mg | Freq: Once | INTRAVENOUS | Status: DC
  Filled 2016-02-28: qty 20

## 2016-02-28 MED ORDER — CELECOXIB 200 MG PO CAPS
200.0000 mg | ORAL_CAPSULE | Freq: Two times a day (BID) | ORAL | Status: DC
  Administered 2016-02-29 – 2016-03-01 (×4): 200 mg via ORAL
  Filled 2016-02-28 (×3): qty 1

## 2016-02-28 MED ORDER — POLYETHYLENE GLYCOL 3350 17 G PO PACK: 17.0000 g | PACK | Freq: Every day | ORAL | Status: DC | PRN

## 2016-02-28 MED ORDER — PHENYLEPHRINE 40 MCG/ML (10ML) SYRINGE FOR IV PUSH (FOR BLOOD PRESSURE SUPPORT)
PREFILLED_SYRINGE | INTRAVENOUS | Status: AC
  Filled 2016-02-28: qty 20

## 2016-02-28 MED ORDER — HYDROMORPHONE HCL 1 MG/ML IJ SOLN
INTRAMUSCULAR | Status: AC
  Administered 2016-02-28: 0.5 mg via INTRAVENOUS
  Filled 2016-02-28: qty 1

## 2016-02-28 MED ORDER — DEXTROSE-NACL 5-0.45 % IV SOLN: INTRAVENOUS | Status: DC

## 2016-02-28 MED ORDER — PHENYLEPHRINE HCL 10 MG/ML IJ SOLN
INTRAMUSCULAR | Status: DC | PRN
  Administered 2016-02-28: 80 ug via INTRAVENOUS
  Administered 2016-02-28: 40 ug via INTRAVENOUS
  Administered 2016-02-28 (×2): 80 ug via INTRAVENOUS
  Administered 2016-02-28: 40 ug via INTRAVENOUS
  Administered 2016-02-28 (×2): 80 ug via INTRAVENOUS

## 2016-02-28 MED ORDER — ONDANSETRON HCL 4 MG/2ML IJ SOLN
4.0000 mg | Freq: Four times a day (QID) | INTRAMUSCULAR | Status: DC | PRN
  Administered 2016-02-29: 4 mg via INTRAVENOUS
  Filled 2016-02-28 (×2): qty 2

## 2016-02-28 MED ORDER — SUMATRIPTAN SUCCINATE 100 MG PO TABS
100.0000 mg | ORAL_TABLET | ORAL | Status: DC | PRN
  Filled 2016-02-28: qty 1

## 2016-02-28 MED ORDER — ONDANSETRON HCL 4 MG PO TABS
4.0000 mg | ORAL_TABLET | Freq: Four times a day (QID) | ORAL | Status: DC | PRN
  Administered 2016-02-28: 4 mg via ORAL
  Filled 2016-02-28 (×2): qty 1

## 2016-02-28 MED ORDER — KCL IN DEXTROSE-NACL 20-5-0.45 MEQ/L-%-% IV SOLN
INTRAVENOUS | Status: DC
  Administered 2016-02-28: 125 mL/h via INTRAVENOUS
  Administered 2016-02-29: 02:00:00 via INTRAVENOUS
  Filled 2016-02-28 (×3): qty 1000

## 2016-02-28 MED ORDER — ALUMINUM HYDROXIDE GEL 320 MG/5ML PO SUSP
15.0000 mL | ORAL | Status: DC | PRN
  Filled 2016-02-28: qty 30

## 2016-02-28 MED ORDER — HYDROMORPHONE HCL 1 MG/ML IJ SOLN
0.2500 mg | INTRAMUSCULAR | Status: DC | PRN
  Administered 2016-02-28 (×3): 0.5 mg via INTRAVENOUS

## 2016-02-28 MED ORDER — PROPOFOL 10 MG/ML IV BOLUS
INTRAVENOUS | Status: AC
  Filled 2016-02-28: qty 40

## 2016-02-28 MED ORDER — ASPIRIN EC 325 MG PO TBEC: 325.0000 mg | DELAYED_RELEASE_TABLET | Freq: Two times a day (BID) | ORAL | 0 refills | Status: DC

## 2016-02-28 MED ORDER — TIZANIDINE HCL 2 MG PO TABS: 2.0000 mg | ORAL_TABLET | Freq: Four times a day (QID) | ORAL | 0 refills | Status: DC | PRN

## 2016-02-28 MED ORDER — ASPIRIN EC 325 MG PO TBEC
325.0000 mg | DELAYED_RELEASE_TABLET | Freq: Every day | ORAL | Status: DC
  Administered 2016-02-29 – 2016-03-01 (×2): 325 mg via ORAL
  Filled 2016-02-28 (×2): qty 1

## 2016-02-28 MED ORDER — BUPIVACAINE LIPOSOME 1.3 % IJ SUSP
20.0000 mL | INTRAMUSCULAR | Status: DC
  Filled 2016-02-28: qty 20

## 2016-02-28 MED ORDER — FLEET ENEMA 7-19 GM/118ML RE ENEM: 1.0000 | ENEMA | Freq: Once | RECTAL | Status: DC | PRN

## 2016-02-28 MED ORDER — SODIUM CHLORIDE 0.9 % IV SOLN
1000.0000 mg | INTRAVENOUS | Status: AC
  Administered 2016-02-28: 1000 mg via INTRAVENOUS
  Filled 2016-02-28: qty 10

## 2016-02-28 MED ORDER — FENTANYL CITRATE (PF) 100 MCG/2ML IJ SOLN
INTRAMUSCULAR | Status: DC | PRN
  Administered 2016-02-28 (×3): 50 ug via INTRAVENOUS

## 2016-02-28 MED ORDER — MELATONIN 3 MG PO TABS
9.0000 mg | ORAL_TABLET | Freq: Every evening | ORAL | Status: DC | PRN
  Filled 2016-02-28: qty 3

## 2016-02-28 MED ORDER — BUPIVACAINE-EPINEPHRINE 0.25% -1:200000 IJ SOLN
INTRAMUSCULAR | Status: AC
  Filled 2016-02-28: qty 1

## 2016-02-28 MED ORDER — BISACODYL 5 MG PO TBEC: 5.0000 mg | DELAYED_RELEASE_TABLET | Freq: Every day | ORAL | Status: DC | PRN

## 2016-02-28 MED ORDER — METOCLOPRAMIDE HCL 5 MG PO TABS: 5.0000 mg | ORAL_TABLET | Freq: Three times a day (TID) | ORAL | Status: DC | PRN

## 2016-02-28 MED ORDER — KCL IN DEXTROSE-NACL 20-5-0.45 MEQ/L-%-% IV SOLN
INTRAVENOUS | Status: AC
Start: 2016-02-28 — End: 2016-02-29
  Filled 2016-02-28: qty 1000

## 2016-02-28 MED ORDER — ACETAMINOPHEN 325 MG PO TABS: 650.0000 mg | ORAL_TABLET | Freq: Four times a day (QID) | ORAL | Status: DC | PRN

## 2016-02-28 MED ORDER — PROPOFOL 500 MG/50ML IV EMUL
INTRAVENOUS | Status: DC | PRN
  Administered 2016-02-28: 50 ug/kg/min via INTRAVENOUS

## 2016-02-28 MED ORDER — DEXTROSE 5 % IV SOLN
500.0000 mg | Freq: Four times a day (QID) | INTRAVENOUS | Status: DC | PRN
  Filled 2016-02-28: qty 5

## 2016-02-28 MED ORDER — LIDOCAINE HCL (CARDIAC) 20 MG/ML IV SOLN
INTRAVENOUS | Status: DC | PRN
  Administered 2016-02-28: 50 mg via INTRAVENOUS

## 2016-02-28 MED ORDER — CEFAZOLIN SODIUM-DEXTROSE 2-4 GM/100ML-% IV SOLN
INTRAVENOUS | Status: AC
  Filled 2016-02-28: qty 100

## 2016-02-28 MED ORDER — OXYCODONE-ACETAMINOPHEN 5-325 MG PO TABS: 1.0000 | ORAL_TABLET | ORAL | 0 refills | Status: DC | PRN

## 2016-02-28 MED ORDER — PROMETHAZINE HCL 25 MG/ML IJ SOLN
INTRAMUSCULAR | Status: AC
  Administered 2016-02-28: 12.5 mg via INTRAVENOUS
  Filled 2016-02-28: qty 1

## 2016-02-28 MED ORDER — MIDAZOLAM HCL 5 MG/5ML IJ SOLN
INTRAMUSCULAR | Status: DC | PRN
  Administered 2016-02-28 (×3): 1 mg via INTRAVENOUS

## 2016-02-28 MED ORDER — 0.9 % SODIUM CHLORIDE (POUR BTL) OPTIME
TOPICAL | Status: DC | PRN
  Administered 2016-02-28: 1000 mL

## 2016-02-28 SURGICAL SUPPLY — 46 items
BLADE SURG ROTATE 9660 (MISCELLANEOUS) IMPLANT
CAPT HIP TOTAL 2 ×1 IMPLANT
COVER PERINEAL POST (MISCELLANEOUS) ×2 IMPLANT
COVER SURGICAL LIGHT HANDLE (MISCELLANEOUS) ×2 IMPLANT
DRAPE C-ARM 42X72 X-RAY (DRAPES) ×2 IMPLANT
DRAPE STERI IOBAN 125X83 (DRAPES) ×2 IMPLANT
DRAPE U-SHAPE 47X51 STRL (DRAPES) ×4 IMPLANT
DRSG AQUACEL AG ADV 3.5X10 (GAUZE/BANDAGES/DRESSINGS) ×2 IMPLANT
DURAPREP 26ML APPLICATOR (WOUND CARE) ×2 IMPLANT
ELECT BLADE 4.0 EZ CLEAN MEGAD (MISCELLANEOUS) ×2
ELECT REM PT RETURN 9FT ADLT (ELECTROSURGICAL) ×2
ELECTRODE BLDE 4.0 EZ CLN MEGD (MISCELLANEOUS) ×1 IMPLANT
ELECTRODE REM PT RTRN 9FT ADLT (ELECTROSURGICAL) ×1 IMPLANT
FACESHIELD WRAPAROUND (MASK) ×4 IMPLANT
FACESHIELD WRAPAROUND OR TEAM (MASK) ×2 IMPLANT
GLOVE BIO SURGEON STRL SZ7.5 (GLOVE) ×2 IMPLANT
GLOVE BIO SURGEON STRL SZ8.5 (GLOVE) ×2 IMPLANT
GLOVE BIOGEL PI IND STRL 8 (GLOVE) ×1 IMPLANT
GLOVE BIOGEL PI IND STRL 9 (GLOVE) ×1 IMPLANT
GLOVE BIOGEL PI INDICATOR 8 (GLOVE) ×1
GLOVE BIOGEL PI INDICATOR 9 (GLOVE) ×1
GOWN STRL REUS W/ TWL LRG LVL3 (GOWN DISPOSABLE) ×1 IMPLANT
GOWN STRL REUS W/ TWL XL LVL3 (GOWN DISPOSABLE) ×2 IMPLANT
GOWN STRL REUS W/TWL LRG LVL3 (GOWN DISPOSABLE) ×2
GOWN STRL REUS W/TWL XL LVL3 (GOWN DISPOSABLE) ×4
KIT BASIN OR (CUSTOM PROCEDURE TRAY) ×2 IMPLANT
KIT ROOM TURNOVER OR (KITS) ×2 IMPLANT
MANIFOLD NEPTUNE II (INSTRUMENTS) ×2 IMPLANT
NDL 22X1.5 STRL (OR ONLY) (MISCELLANEOUS) ×2 IMPLANT
NEEDLE 22X1 1/2 OR ONLY (MISCELLANEOUS) ×2
NEEDLE 22X1.5 STRL (OR ONLY) (MISCELLANEOUS) ×2 IMPLANT
NS IRRIG 1000ML POUR BTL (IV SOLUTION) ×2 IMPLANT
PACK TOTAL JOINT (CUSTOM PROCEDURE TRAY) ×2 IMPLANT
PAD ARMBOARD 7.5X6 YLW CONV (MISCELLANEOUS) ×4 IMPLANT
SAW OSC TIP CART 19.5X105X1.3 (SAW) ×2 IMPLANT
SUT VIC AB 1 CTX 36 (SUTURE) ×2
SUT VIC AB 1 CTX36XBRD ANBCTR (SUTURE) ×1 IMPLANT
SUT VIC AB 2-0 CT1 27 (SUTURE) ×2
SUT VIC AB 2-0 CT1 TAPERPNT 27 (SUTURE) ×2 IMPLANT
SUT VIC AB 3-0 PS2 18 (SUTURE) ×2
SUT VIC AB 3-0 PS2 18XBRD (SUTURE) ×1 IMPLANT
SYR CONTROL 10ML LL (SYRINGE) ×4 IMPLANT
TOWEL OR 17X24 6PK STRL BLUE (TOWEL DISPOSABLE) ×2 IMPLANT
TOWEL OR 17X26 10 PK STRL BLUE (TOWEL DISPOSABLE) ×2 IMPLANT
TRAY FOLEY CATH 14FR (SET/KITS/TRAYS/PACK) IMPLANT
WATER STERILE IRR 1000ML POUR (IV SOLUTION) ×1 IMPLANT

## 2016-02-28 NOTE — Discharge Instructions (Signed)

## 2016-02-28 NOTE — Anesthesia Preprocedure Evaluation (Addendum)
Anesthesia Evaluation  Patient identified by MRN, date of birth, ID band Patient awake    Reviewed: Allergy & Precautions, NPO status , Patient's Chart, lab work & pertinent test results  Airway Mallampati: II  TM Distance: >3 FB Neck ROM: Full    Dental  (+) Dental Advisory Given   Pulmonary neg pulmonary ROS,    breath sounds clear to auscultation       Cardiovascular negative cardio ROS   Rhythm:Regular Rate:Normal     Neuro/Psych  Headaches, Anxiety Depression    GI/Hepatic Neg liver ROS, GERD  ,  Endo/Other  negative endocrine ROS  Renal/GU negative Renal ROS     Musculoskeletal  (+) Arthritis ,   Abdominal   Peds  Hematology negative hematology ROS (+)   Anesthesia Other Findings   Reproductive/Obstetrics                             Lab Results  Component Value Date   WBC 6.6 02/17/2016   HGB 14.9 02/17/2016   HCT 44.6 02/17/2016   MCV 89.9 02/17/2016   PLT 280 02/17/2016   Lab Results  Component Value Date   CREATININE 0.86 02/17/2016   BUN 8 02/17/2016   NA 136 02/17/2016   K 4.0 02/17/2016   CL 101 02/17/2016   CO2 26 02/17/2016    Anesthesia Physical Anesthesia Plan  ASA: II  Anesthesia Plan: MAC and Spinal   Post-op Pain Management:    Induction: Intravenous  Airway Management Planned: Natural Airway and Simple Face Mask  Additional Equipment:   Intra-op Plan:   Post-operative Plan:   Informed Consent: I have reviewed the patients History and Physical, chart, labs and discussed the procedure including the risks, benefits and alternatives for the proposed anesthesia with the patient or authorized representative who has indicated his/her understanding and acceptance.     Plan Discussed with: CRNA  Anesthesia Plan Comments:        Anesthesia Quick Evaluation

## 2016-02-28 NOTE — Op Note (Signed)
OPERATIVE REPORT    DATE OF PROCEDURE:  02/28/2016       PREOPERATIVE DIAGNOSIS:  LEFT HIP OSTEOARTHRITIS                                                          POSTOPERATIVE DIAGNOSIS:  LEFT HIP OSTEOARTHRITIS                                                           PROCEDURE: Anterior L total hip arthroplasty using a 52 mm DePuy Pinnacle  Cup, [employer], 0-degree polyethylene liner, a +8.5 mm ceramic head, a 3 Depuy Triloc stem   SURGEON: Mattisyn Cardona J    ASSISTANT:   Eric K. [employer]  (present throughout entire procedure and necessary for timely completion of the procedure)   ANESTHESIA: spinal BLOOD LOSS: 300 FLUID REPLACEMENT: 1600 crystalloid Antibiotic: 2gm ancef Tranexamic Acid: 1gm iv, 2gm topical COMPLICATIONS: none    INDICATIONS FOR PROCEDURE: A 60 y.o. year-old With  LEFT HIP OSTEOARTHRITIS   for 3 years, x-rays show bone-on-bone arthritic changes, and osteophytes. Despite conservative measures with observation, anti-inflammatory medicine, narcotics, use of a cane, has severe unremitting pain and can ambulate only a few blocks before resting. Patient desires elective L total hip arthroplasty to decrease pain and increase function. The risks, benefits, and alternatives were discussed at length including but not limited to the risks of infection, bleeding, nerve injury, stiffness, blood clots, the need for revision surgery, cardiopulmonary complications, among others, and they were willing to proceed. Questions answered     PROCEDURE IN DETAIL: The patient was identified by armband,  received preoperative IV antibiotics in the holding area at [facility], taken to the operating room , appropriate anesthetic monitors  were attached and  anesthesia was induced with the patienton the gurney. The HANA boots were applied to the feet and he was then transferred to the HANA table with a peroneal post and support underneath the non-operative le, which  was locked in 5 lb traction. Theoperative lower extremity was then prepped and draped in the usual sterile fashion from just above the iliac crest to the knee. And a timeout procedure was performed. We then made a 10 cm incision along the interval at the leading edge of the tensor fascia lata of starting at 2 cm lateral to and 2 cm distal to the ASIS. Small bleeders in the skin and subcutaneous tissue identified and cauterized we dissected down to the fascia and made an incision in the fascia allowing [place] to elevate the fascia of the tensor muscle and exploited the interval between the rectus and the tensor fascia lata. A Hohmann retractor was then placed along the superior neck of the femur and a Cobra retractor along the inferior neck of the femur we teed the capsule starting out at the superior anterior aspect of the acetabulum going distally and made the T along the neck both leaflets of the T were tagged with #2 Ethibond suture. Cobra retractors were then placed along the inferior and superior neck allowing [place] to perform a standard neck cut and removed the  femoral head with a power corkscrew. We then placed a right angle Hohmann retractor along the anterior aspect of the acetabulum a spiked Cobra in the cotyloid notch and posteriorly a Muelller retractor. We then sequentially reamed up to a 51 mm basket reamer obtaining good coverage in all quadrants, verified by C-arm imaging. Under C-arm control with and hammered into place a 52 mm Pinnacle cup in 45 of abduction and 15 of anteversion. The cup seated nicely and required no supplemental screws. We then placed a central hole Eliminator and a 0 polyethylene liner. The foot was then externally rotated to 110, the HANA elevator was placed around the flare of the greater trochanter and the limb was extended and abducted delivering the proximal femur up into the wound. A medium Hohmann retractor was placed over the greater trochanter and a Mueller retractor along  the posterior femoral neck completing the exposure. We then performed releases superiorly and and inferiorly of the capsule going back to the pirformis fossa superiorly and to the lesser trochanter inferiorly. We then entered the proximal femur with the box cutting offset chisel followed by, a canal sounder, the chili pepper and broaching up to a 3 broach. This seated nicely and we reamed the calcar. A trial reduction was performed with a 1.5 mm 22mm mm head.The limb lengths were excellent the hip was stable in 90 of external rotation. At this point the trial components removed and we hammered into place a # 3 Tri-Lock stem with Gryption coating. This was a std offset stem and a + 8.5 36 mm ceramic ball was then hammered into place the hip was reduced and final C-arm images obtained. The wound was thoroughly irrigated with normal saline solution. We repaired the ant capsule and the tensor fascia lot a with running 0 vicryl suture. the subcutaneous tissue was closed with 2-0 and 3-0 Vicryl suture followed by an Aquacil dressing. At this point the patient was awaken and transferred to hospital gurney without difficulty. The subcutaneous tissue with 0 and 2-0 undyed Vicryl suture and the skin with running  3-0 vicryl subcuticular suture. Aquacil dressing was applied. The patient was then unclamped, rolled supine, awaken extubated and taken to recovery room without difficulty in stable condition.   Frederik Pear J 02/28/2016, 5:25 PM

## 2016-02-28 NOTE — Interval H&P Note (Signed)
History and Physical Interval Note:  02/28/2016 3:22 PM  Peter [name]  has presented today for surgery, with the diagnosis of LEFT HIP OSTEOARTHRITIS  The various methods of treatment have been discussed with the patient and family. After consideration of risks, benefits and other options for treatment, the patient has consented to  Procedure(s): TOTAL HIP ARTHROPLASTY ANTERIOR APPROACH (Left) as a surgical intervention .  The patient's history has been reviewed, patient examined, no change in status, stable for surgery.  I have reviewed the patient's chart and labs.  Questions were answered to the patient's satisfaction.     Kerin Salen

## 2016-02-28 NOTE — Anesthesia Postprocedure Evaluation (Signed)
Anesthesia Post Note  Patient: Peter [name]  Procedure(s) Performed: Procedure(s) (LRB): TOTAL HIP ARTHROPLASTY ANTERIOR APPROACH (Left)  Patient location during evaluation: PACU Anesthesia Type: Spinal Level of consciousness: oriented and awake and alert Pain management: pain level controlled Vital Signs Assessment: post-procedure vital signs reviewed and stable Respiratory status: spontaneous breathing, respiratory function stable and patient connected to nasal cannula oxygen Cardiovascular status: blood pressure returned to baseline and stable Postop Assessment: no headache, no backache and spinal receding Anesthetic complications: no    Last Vitals:  Vitals:   02/28/16 2040 02/28/16 2150  BP: 98/72 102/66  Pulse: 67   Resp: 12   Temp:      Last Pain:  Vitals:   02/28/16 2145  TempSrc:   PainSc: 10-Worst pain ever                 Effie Berkshire

## 2016-02-28 NOTE — Anesthesia Procedure Notes (Signed)
Spinal  Staffing Anesthesiologist: Suzette Battiest Performed: anesthesiologist  Preanesthetic Checklist Completed: patient identified, site marked, surgical consent, pre-op evaluation, timeout performed, IV checked, risks and benefits discussed and monitors and equipment checked Spinal Block Patient position: sitting Prep: site prepped and draped and DuraPrep Patient monitoring: blood pressure, continuous pulse ox and heart rate Approach: midline Location: L4-5 Injection technique: single-shot Needle Needle type: Pencan  Needle gauge: 24 G Needle length: 9 cm

## 2016-02-28 NOTE — Transfer of Care (Signed)
Immediate Anesthesia Transfer of Care Note  Patient: Peter [name]  Procedure(s) Performed: Procedure(s): TOTAL HIP ARTHROPLASTY ANTERIOR APPROACH (Left)  Patient Location: PACU  Anesthesia Type:MAC and Spinal  Level of Consciousness: awake, alert , oriented and patient cooperative  Airway & Oxygen Therapy: Patient Spontanous Breathing and Patient connected to nasal cannula oxygen  Post-op Assessment: Report given to RN and Post -op Vital signs reviewed and stable  Post vital signs: Reviewed and stable  Last Vitals:  Vitals:   02/28/16 1051  BP: (!) 147/85  Pulse: 70  Resp: 18  Temp: 36.7 C    Last Pain:  Vitals:   02/28/16 1231  TempSrc:   PainSc: 9       Patients Stated Pain Goal: 5 ([id number])  Complications: No apparent anesthesia complications

## 2016-02-29 ENCOUNTER — Encounter (HOSPITAL_COMMUNITY): Payer: Self-pay | Admitting: Orthopedic Surgery

## 2016-02-29 LAB — BASIC METABOLIC PANEL
Anion gap: 3 — ABNORMAL LOW (ref 5–15)
BUN: 7 mg/dL (ref 6–20)
CHLORIDE: 107 mmol/L (ref 101–111)
CO2: 28 mmol/L (ref 22–32)
Calcium: 8.7 mg/dL — ABNORMAL LOW (ref 8.9–10.3)
Creatinine, Ser: 0.95 mg/dL (ref 0.61–1.24)
GFR calc Af Amer: >60 mL/min (ref >60)
GLUCOSE: 117 mg/dL — AB (ref 65–99)
POTASSIUM: 4.5 mmol/L (ref 3.5–5.1)
Sodium: 138 mmol/L (ref 135–145)

## 2016-02-29 LAB — CBC
HEMATOCRIT: 34.2 % — AB (ref 39.0–52.0)
HEMOGLOBIN: 11.4 g/dL — AB (ref 13.0–17.0)
MCH: 29.6 pg (ref 26.0–34.0)
MCHC: 33.3 g/dL (ref 30.0–36.0)
MCV: 88.8 fL (ref 78.0–100.0)
Platelets: 208 10*3/uL (ref 150–400)
RBC: 3.85 MIL/uL — ABNORMAL LOW (ref 4.22–5.81)
RDW: 15.3 % (ref 11.5–15.5)
WBC: 11.3 10*3/uL — ABNORMAL HIGH (ref 4.0–10.5)

## 2016-02-29 MED ORDER — HYDROMORPHONE HCL 2 MG/ML IJ SOLN
1.0000 mg | INTRAMUSCULAR | Status: DC | PRN
  Administered 2016-02-29 (×2): 1 mg via INTRAVENOUS
  Filled 2016-02-29 (×2): qty 1

## 2016-02-29 NOTE — Evaluation (Signed)
Occupational Therapy Evaluation Patient Details Name: Peter [name] MRN: [id number] DOB: Sep 17, 1955 Today's Date: 02/29/2016    History of Present Illness Patient is a 60 y/o male with hx of depression, anxiety and Rt THA presents s/p left THA, direct anterior approach.   Clinical Impression   PTA, pt was independent with all ADL and IADL. Pt currently requires supervision for functional mobility and moderate assistance for LB dressing. Pt would benefit from continued OT services while admitted to improve independence with ADL and functional mobility prior to D/C. Recommend D/C home with 24 hour assistance/supervision from sister. Pt plans to stay at sister's home for approximately 1 week. Pt does not need further OT follow-up. Recommend 3-in-1 BSC for DME needs. OT will continue to follow while admitted with a focus on tub transfer and LB ADL.    Follow Up Recommendations  No OT follow up;Supervision/Assistance - 24 hour    Equipment Recommendations  3 in 1 bedside comode       Precautions / Restrictions Restrictions Weight Bearing Restrictions: Yes LLE Weight Bearing: Weight bearing as tolerated      Mobility Bed Mobility Overal bed mobility: Needs Assistance Bed Mobility: Sit to Supine       Sit to supine: Supervision;HOB elevated   General bed mobility comments: Pt up in recliner upon OT arrival.  Transfers Overall transfer level: Needs assistance Equipment used: Rolling walker (2 wheeled) Transfers: Sit to/from Stand Sit to Stand: Supervision         General transfer comment: Supervision for safety.     Balance Overall balance assessment: Needs assistance Sitting-balance support: Feet supported;No upper extremity supported Sitting balance-Leahy Scale: Good     Standing balance support: During functional activity;Single extremity supported Standing balance-Leahy Scale: Fair Standing balance comment: Able to stand with 1 UE support statically.                             ADL Overall ADL's : Needs assistance/impaired Eating/Feeding: Set up;Sitting   Grooming: Min guard;Standing   Upper Body Bathing: Supervision/ safety;Sitting   Lower Body Bathing: Moderate assistance;Sit to/from stand   Upper Body Dressing : Supervision/safety;Sitting   Lower Body Dressing: Moderate assistance;Sit to/from stand   Toilet Transfer: Ambulation;BSC;RW;Supervision/safety   Toileting- Water quality scientist and Hygiene: Supervision/safety;Sit to/from stand       Functional mobility during ADLs: Min guard;Rolling walker General ADL Comments: Pt educated concerning use of ice for pain management, fall prevention in the home, use of AE to increase independence with ADL, and dressing techniques.     Vision Vision Assessment?: No apparent visual deficits          Pertinent Vitals/Pain Pain Assessment: Faces Faces Pain Scale: Hurts little more Pain Location: L hip Pain Descriptors / Indicators: Operative site guarding;Sore Pain Intervention(s): Monitored during session;Repositioned;Patient requesting pain meds-RN notified     Hand Dominance Right   Extremity/Trunk Assessment Upper Extremity Assessment Upper Extremity Assessment: Overall WFL for tasks assessed   Lower Extremity Assessment Lower Extremity Assessment: LLE deficits/detail LLE Deficits / Details: Decreased ROM and strength as expected post-operatively.       Communication Communication Communication: No difficulties   Cognition Arousal/Alertness: Awake/alert Behavior During Therapy: WFL for tasks assessed/performed Overall Cognitive Status: Within Functional Limits for tasks assessed                                Home  Living Family/patient expects to be discharged to:: Private residence Living Arrangements: Other relatives;Other (Comment) (Living with sister while recovering.) Available Help at Discharge: Family;Available 24 hours/day Type of Home:  House Home Access: Stairs to enter [employer] of Steps: 4 Entrance Stairs-Rails: Right Home Layout: One level     Bathroom Shower/Tub: Tub/shower unit Shower/tub characteristics: Architectural technologist: Standard     Home Equipment: Cane - single point;Hand held shower head          Prior Functioning/Environment Level of Independence: Independent with assistive device(s)        Comments: been using SPC as needed the last few months; prior to that, very active, mountain biking etc.         OT Problem List: Decreased strength;Decreased range of motion;Decreased activity tolerance;Impaired balance (sitting and/or standing);Decreased knowledge of use of DME or AE;Decreased knowledge of precautions;Pain   OT Treatment/Interventions: Self-care/ADL training;Therapeutic exercise;DME and/or AE instruction;Therapeutic activities;Patient/family education;Balance training    OT Goals(Current goals can be found in the care plan section) Acute Rehab OT Goals Patient Stated Goal: to get back to mountain biking OT Goal Formulation: With patient Time For Goal Achievement: 03/14/16 Potential to Achieve Goals: Good ADL Goals Pt Will Perform Lower Body Dressing: with modified independence;sit to/from stand Pt Will Transfer to Toilet: with modified independence;ambulating;bedside commode Pt Will Perform Tub/Shower Transfer: with supervision;3 in 1;rolling walker;ambulating  OT Frequency: Min 2X/week    End of Session Equipment Utilized During Treatment: Gait belt;Rolling walker Nurse Communication: Patient requests pain meds (Pt requests lunch order.)  Activity Tolerance: Patient tolerated treatment well Patient left: in bed;with call bell/phone within reach   Time: 1201-1235 OT Time Calculation (min): 34 min Charges:  OT General Charges $OT Visit: 1 Procedure OT Evaluation $OT Eval Moderate Complexity: 1 Procedure OT Treatments $Self Care/Home Management : 8-22 mins    Norman Herrlich, OTR/L [phone] 02/29/2016, 1:33 PM

## 2016-02-29 NOTE — Progress Notes (Signed)
PATIENT ID: Peter [name]  MRN: [id number]  DOB/AGE:  59/08/1955 / 60 y.o.  1 Day Post-Op Procedure(s) (LRB): TOTAL HIP ARTHROPLASTY ANTERIOR APPROACH (Left)    PROGRESS NOTE Subjective: Patient is alert, oriented, no Nausea, no Vomiting, yes passing gas, . Taking PO well. Denies SOB, Chest or Calf Pain. Using Incentive Spirometer, PAS in place. Ambulate to bathroom Patient reports pain as  3/10  .    Objective: Vital signs in last 24 hours: Vitals:   02/28/16 2040 02/28/16 2150 02/28/16 2243 02/29/16 0237  BP: 98/72 102/66 116/74 (!) 101/57  Pulse: 67  84 (!) 109  Resp: 12   16  Temp:   98.2 F (36.8 C) 99.9 F (37.7 C)  TempSrc:   Oral Oral  SpO2: 100%  100% 96%  Weight:      Height:          Intake/Output from previous day: I/O last 3 completed shifts: In: 2604.2 [P.O.:150; I.V.:2454.2] Out: 1210 [Urine:910; Blood:300]   Intake/Output this shift: No intake/output data recorded.   LABORATORY DATA: No results for input(s): WBC, HGB, HCT, PLT, NA, K, CL, CO2, BUN, CREATININE, GLUCOSE, GLUCAP, INR, CALCIUM in the last 72 hours.  Invalid input(s): PT, 2  Examination: Neurologically intact ABD soft Neurovascular intact Sensation intact distally Intact pulses distally Dorsiflexion/Plantar flexion intact Incision: dressing C/D/I No cellulitis present Compartment soft} XR AP&Lat of hip shows well placed\fixed THA  Assessment:   1 Day Post-Op Procedure(s) (LRB): TOTAL HIP ARTHROPLASTY ANTERIOR APPROACH (Left) ADDITIONAL DIAGNOSIS:  Expected Acute Blood Loss Anemia,   Plan: PT/OT WBAT, THA  DVT Prophylaxis: SCDx72 hrs, ASA 325 mg BID x 2 weeks  DISCHARGE PLAN: Home  DISCHARGE NEEDS: HHPT, Walker and 3-in-1 comode seatPatient ID: Peter [name], male   DOB: 29-Dec-1955, 60 y.o.   MRN: [id number]

## 2016-02-29 NOTE — Evaluation (Signed)
Physical Therapy Evaluation Patient Details Name: Peter [name] MRN: [id number] DOB: 1956/04/30 Today's Date: 02/29/2016   History of Present Illness  Patient is a 60 y/o male with hx of depression, anxiety and Rt THA presents s/p left THA, direct anterior approach.  Clinical Impression  Patient presents with nausea, pain and post surgical deficits s/p above surgery. Tolerated gait training with supervision for safety. Instructed pt in exercises. Will plan for stair training this afternoon as tolerated. Pt plans to discharge home with sister for a few weeks. Will follow acutely to maximize independence and mobility prior to return home.     Follow Up Recommendations Home health PT    Equipment Recommendations  Rolling walker with 5" wheels    Recommendations for Other Services       Precautions / Restrictions Restrictions Weight Bearing Restrictions: Yes LLE Weight Bearing: Weight bearing as tolerated      Mobility  Bed Mobility               General bed mobility comments: Sitting in bathroom with tech upon PT arrival.   Transfers Overall transfer level: Needs assistance Equipment used: Rolling walker (2 wheeled) Transfers: Sit to/from Stand Sit to Stand: Supervision         General transfer comment: Supervision for safety.   Ambulation/Gait Ambulation/Gait assistance: Supervision Ambulation Distance (Feet): 250 Feet Assistive device: Rolling walker (2 wheeled) Gait Pattern/deviations: Step-through pattern;Decreased stance time - left;Decreased step length - right Gait velocity: decreased Gait velocity interpretation: Below normal speed for age/gender General Gait Details: Slow, steady gait. Cues for RW positioning. Increased WB through [place].  Stairs            Wheelchair Mobility    Modified Rankin (Stroke Patients Only)       Balance Overall balance assessment: Needs assistance Sitting-balance support: Feet supported;No upper extremity  supported Sitting balance-Leahy Scale: Good     Standing balance support: During functional activity Standing balance-Leahy Scale: Fair Standing balance comment: Able to stand with 1 UE support statically.                             Pertinent Vitals/Pain Pain Assessment: Faces Faces Pain Scale: Hurts little more Pain Location: left hip Pain Descriptors / Indicators: Sore;Operative site guarding Pain Intervention(s): Monitored during session;Repositioned;Premedicated before session    [place] expects to be discharged to:: Private residence Living Arrangements: Other relatives (with sister) Available Help at Discharge: Family;Available PRN/intermittently Type of Home: House Home Access: Stairs to enter Entrance Stairs-Rails: Right Entrance Stairs-Number of Steps: 4 Home Layout: One level Home Equipment: Cane - single point      Prior Function Level of Independence: Independent with assistive device(s)         Comments: been using SPC as needed the last few months; prior to that, very active, mountain biking etc.      Hand Dominance        Extremity/Trunk Assessment   Upper Extremity Assessment: Defer to OT evaluation           Lower Extremity Assessment: LLE deficits/detail   LLE Deficits / Details: Limited AROM/strength secondary to pain/surgery, Able to perform LAQ.     Communication   Communication: No difficulties  Cognition Arousal/Alertness: Awake/alert Behavior During Therapy: WFL for tasks assessed/performed Overall Cognitive Status: Within Functional Limits for tasks assessed  General Comments      Exercises Total Joint Exercises Ankle Circles/Pumps: Both;10 reps;Seated Quad Sets: Both;10 reps;Seated Long Arc Quad: Left;10 reps;Seated   Assessment/Plan    PT Assessment Patient needs continued PT services  PT Problem List Decreased strength;Decreased mobility;Decreased  balance;Pain;Decreased activity tolerance;Decreased range of motion          PT Treatment Interventions DME instruction;Therapeutic activities;Gait training;Therapeutic exercise;Patient/family education;Stair training;Balance training;Functional mobility training    PT Goals (Current goals can be found in the Care Plan section)  Acute Rehab PT Goals Patient Stated Goal: to get back to mountain biking PT Goal Formulation: With patient Time For Goal Achievement: 03/14/16 Potential to Achieve Goals: Good    Frequency 7X/week   Barriers to discharge Inaccessible home environment stairs to enter home    Co-evaluation               End of Session Equipment Utilized During Treatment: Gait belt Activity Tolerance: Patient tolerated treatment well Patient left: in chair;with call bell/phone within reach Nurse Communication: Mobility status         Time: [id number] PT Time Calculation (min) (ACUTE ONLY): 21 min   Charges:   PT Evaluation $PT Eval Low Complexity: 1 Procedure     PT G Codes:        Thora Scherman A Prudie Guthridge 02/29/2016, 11:55 AM  Wray Kearns, PT, DPT [phone]

## 2016-02-29 NOTE — Progress Notes (Signed)
Physical Therapy Treatment Patient Details Name: Peter [name] MRN: [id number] DOB: 05-21-1955 Today's Date: 02/29/2016    History of Present Illness Patient is a 60 y/o male with hx of depression, anxiety and Rt THA presents s/p left THA, direct anterior approach.    PT Comments    Patient progressing well towards PT goals. Performed stair training this session with supervision for safety. Pt mobilizing well and improved distance this afternoon. Will plan on bringing exercise handout tomorrow for home. Able to tolerate exercises. Will continue to follow.   Follow Up Recommendations  Home health PT     Equipment Recommendations  Rolling walker with 5" wheels    Recommendations for Other Services       Precautions / Restrictions Restrictions Weight Bearing Restrictions: Yes LLE Weight Bearing: Weight bearing as tolerated    Mobility  Bed Mobility Overal bed mobility: Needs Assistance Bed Mobility: Supine to Sit;Sit to Supine     Supine to sit: Modified independent (Device/Increase time) Sit to supine: Modified independent (Device/Increase time)   General bed mobility comments: HOB flat, no use of rails to simulate home.   Transfers Overall transfer level: Needs assistance Equipment used: Rolling walker (2 wheeled) Transfers: Sit to/from Stand Sit to Stand: Supervision         General transfer comment: Supervision for safety.   Ambulation/Gait Ambulation/Gait assistance: Supervision Ambulation Distance (Feet): 300 Feet Assistive device: Rolling walker (2 wheeled) Gait Pattern/deviations: Step-through pattern;Decreased stride length Gait velocity: decreased Gait velocity interpretation: Below normal speed for age/gender General Gait Details: Steady gait. Increased WB through [place].   Stairs Stairs: Yes Stairs assistance: Supervision Stair Management: Step to pattern;One rail Right Number of Stairs: 4 General stair comments: Cues for technique and  safety  Wheelchair Mobility    Modified Rankin (Stroke Patients Only)       Balance Overall balance assessment: Needs assistance Sitting-balance support: Feet supported;No upper extremity supported Sitting balance-Leahy Scale: Good     Standing balance support: During functional activity Standing balance-Leahy Scale: Fair Standing balance comment: ABle to stand at sink and wash hands without UE support.                    Cognition Arousal/Alertness: Awake/alert Behavior During Therapy: WFL for tasks assessed/performed Overall Cognitive Status: Within Functional Limits for tasks assessed                      Exercises Total Joint Exercises Ankle Circles/Pumps: Both;10 reps;Supine Quad Sets: Both;10 reps;Seated Heel Slides: Left;10 reps;Supine Hip ABduction/ADduction: Left;10 reps;Supine Long Arc Quad: Left;10 reps;Seated    General Comments        Pertinent Vitals/Pain Pain Assessment: 0-10 Pain Score: 8  Faces Pain Scale: Hurts little more Pain Location: left hip Pain Descriptors / Indicators: Sore;Operative site guarding Pain Intervention(s): Patient requesting pain meds-RN notified;Monitored during session;Ice applied    Home Living Family/patient expects to be discharged to:: Private residence Living Arrangements: Other relatives;Other (Comment) (Living with sister while recovering.) Available Help at Discharge: Family;Available 24 hours/day Type of Home: House Home Access: Stairs to enter Entrance Stairs-Rails: Right Home Layout: One level Home Equipment: Cane - single point;Hand held shower head      Prior Function Level of Independence: Independent with assistive device(s)      Comments: been using SPC as needed the last few months; prior to that, very active, mountain biking etc.    PT Goals (current goals can now be found in the care  plan section) Acute Rehab PT Goals Patient Stated Goal: to get back to mountain biking PT Goal  Formulation: With patient Time For Goal Achievement: 03/14/16 Potential to Achieve Goals: Good Progress towards PT goals: Progressing toward goals    Frequency    7X/week      PT Plan Current plan remains appropriate    Co-evaluation             End of Session Equipment Utilized During Treatment: Gait belt Activity Tolerance: Patient tolerated treatment well Patient left: in bed;with call bell/phone within reach     Time: 1421-1446 PT Time Calculation (min) (ACUTE ONLY): 25 min  Charges:  $Gait Training: 8-22 mins $Therapeutic Exercise: 8-22 mins                    G Codes:      Levoy Geisen A Meridee Branum 02/29/2016, 3:00 PM  Wray Kearns, [place], DPT [phone]

## 2016-03-01 LAB — CBC
HEMATOCRIT: 32.4 % — AB (ref 39.0–52.0)
Hemoglobin: 10.8 g/dL — ABNORMAL LOW (ref 13.0–17.0)
MCH: 29.5 pg (ref 26.0–34.0)
MCHC: 33.3 g/dL (ref 30.0–36.0)
MCV: 88.5 fL (ref 78.0–100.0)
PLATELETS: 227 10*3/uL (ref 150–400)
RBC: 3.66 MIL/uL — ABNORMAL LOW (ref 4.22–5.81)
RDW: 15.4 % (ref 11.5–15.5)
WBC: 15.5 10*3/uL — AB (ref 4.0–10.5)

## 2016-03-01 NOTE — Progress Notes (Signed)
Patient discharged to home, discharge instructions given, patient stated he understood

## 2016-03-01 NOTE — Discharge Summary (Signed)
Patient ID: Peter [name] MRN: [id number] DOB/AGE: 07/03/1955 60 y.o.  Admit date: 02/28/2016 Discharge date: 03/01/2016  Admission Diagnoses:  Principal Problem:   Primary osteoarthritis of left hip Active Problems:   Primary localized osteoarthritis of left hip   Discharge Diagnoses:  Same  Past Medical History:  Diagnosis Date  . Anxiety state, unspecified 12/30/2012  . Arthritis   . Depression   . GERD (gastroesophageal reflux disease)   . Migraine   . Neck pain 11/22/2014    Surgeries: Procedure(s): TOTAL HIP ARTHROPLASTY ANTERIOR APPROACH on 02/28/2016   Consultants:   Discharged Condition: Improved  Hospital Course: KEIVON AMENDOLA is an 60 y.o. male who was admitted 02/28/2016 for operative treatment ofPrimary osteoarthritis of left hip. Patient has severe unremitting pain that affects sleep, daily activities, and work/hobbies. After pre-op clearance the patient was taken to the operating room on 02/28/2016 and underwent  Procedure(s): TOTAL HIP ARTHROPLASTY ANTERIOR APPROACH.    Patient was given perioperative antibiotics: Anti-infectives    Start     Dose/Rate Route Frequency Ordered Stop   02/28/16 1300  ceFAZolin (ANCEF) IVPB 2g/100 mL premix     2 g 200 mL/hr over 30 Minutes Intravenous On call to O.R. 02/28/16 1204 02/28/16 1612   02/28/16 1211  ceFAZolin (ANCEF) 2-4 GM/100ML-% IVPB    Comments:  Fabian Sharp   : cabinet override      02/28/16 1211 02/29/16 0029       Patient was given sequential compression devices, early ambulation, and chemoprophylaxis to prevent DVT.  Patient benefited maximally from hospital stay and there were no complications.    Recent vital signs: Patient Vitals for the past 24 hrs:  BP Temp Temp src Pulse Resp SpO2  03/01/16 0545 127/67 98.1 F (36.7 C) Oral 82 16 97 %  02/29/16 1900 124/75 98.2 F (36.8 C) Oral 85 16 97 %  02/29/16 1300 106/70 98.7 F (37.1 C) Oral 89 16 93 %  02/29/16 1231 103/80 98.3 F (36.8  C) Oral 98 - 100 %     Recent laboratory studies:  Recent Labs  02/29/16 0936  WBC 11.3*  HGB 11.4*  HCT 34.2*  PLT 208  NA 138  K 4.5  CL 107  CO2 28  BUN 7  CREATININE 0.95  GLUCOSE 117*  CALCIUM 8.7*     Discharge Medications:     Medication List    STOP taking these medications   diclofenac sodium 1 % Gel Commonly known as:  VOLTAREN   ibuprofen 400 MG tablet Commonly known as:  ADVIL,MOTRIN   naproxen 500 MG tablet Commonly known as:  NAPROSYN   naproxen sodium 220 MG tablet Commonly known as:  ANAPROX     TAKE these medications   aspirin EC 325 MG tablet Take 1 tablet (325 mg total) by mouth 2 (two) times daily.   b complex vitamins tablet Take 1 tablet by mouth daily.   B-12 1000 MCG Caps Take 1,000 mcg by mouth daily.   BIOFREEZE ROLL-ON 4 % Gel Generic drug:  Menthol (Topical Analgesic) Apply 1 application topically at bedtime as needed (back pain).   clonazePAM 0.5 MG tablet Commonly known as:  KLONOPIN Take 1 tablet (0.5 mg total) by mouth 2 (two) times daily as needed for anxiety. ---make office visit with pcp for further refills   lovastatin 40 MG tablet Commonly known as:  MEVACOR Take 1 tablet (40 mg total) by mouth at bedtime.   Melatonin 10 MG Tabs Take  10 mg by mouth at bedtime as needed (for sleep). Alternates with Ambien every other week   multivitamin with minerals Tabs tablet Take 1 tablet by mouth daily.   Omega-3 Krill Oil 300 MG Caps Take 300 mg by mouth daily.   omeprazole 20 MG capsule Commonly known as:  PRILOSEC Take 1 capsule (20 mg total) by mouth daily.   oxyCODONE-acetaminophen 5-325 MG tablet Commonly known as:  ROXICET Take 1 tablet by mouth every 4 (four) hours as needed.   SUMAtriptan 100 MG tablet Commonly known as:  IMITREX Take 1 tablet (100 mg total) by mouth every 2 (two) hours as needed for migraine or headache. May repeat in 2 hours if headache persists or recurs.   tiZANidine 2 MG  tablet Commonly known as:  ZANAFLEX Take 1 tablet (2 mg total) by mouth every 6 (six) hours as needed for muscle spasms.   traMADol 50 MG tablet Commonly known as:  ULTRAM Take 1 tablet (50 mg total) by mouth every 6 (six) hours as needed. What changed:  reasons to take this   zolpidem 10 MG tablet Commonly known as:  AMBIEN Take 1 tablet (10 mg total) by mouth at bedtime as needed. for sleep What changed:  reasons to take this  additional instructions       Diagnostic Studies: Dg Chest 2 View  Result Date: 02/17/2016 CLINICAL DATA:  Preop examination. EXAM: CHEST  2 VIEW COMPARISON:  12/13/2014 go FINDINGS: The heart size and mediastinal contours are within normal limits. Both lungs are clear. The visualized skeletal structures are unremarkable. IMPRESSION: No active cardiopulmonary disease. Electronically Signed   By: Nolon Nations M.D.   On: 02/17/2016 14:36   Dg C-arm 1-60 Min  Result Date: 02/28/2016 CLINICAL DATA:  LEFT hip total arthroplasty. EXAM: OPERATIVE LEFT HIP (WITH PELVIS IF PERFORMED) 1 VIEWS TECHNIQUE: Fluoroscopic spot image(s) were submitted for interpretation post-operatively. Interpreting radiologist was not present at time of operation. FLUOROSCOPY TIME:  25 seconds COMPARISON:  RIGHT hip radiograph December 10, 2015 FINDINGS: Single fluoroscopic spot view of LEFT hip demonstrates total arthroplasty. IMPRESSION: Intraoperative spot view demonstrating LEFT hip total arthroplasty. Electronically Signed   By: Elon Alas M.D.   On: 02/28/2016 19:06   Dg Hip Operative Unilat With Pelvis Left  Result Date: 02/28/2016 CLINICAL DATA:  LEFT hip total arthroplasty. EXAM: OPERATIVE LEFT HIP (WITH PELVIS IF PERFORMED) 1 VIEWS TECHNIQUE: Fluoroscopic spot image(s) were submitted for interpretation post-operatively. Interpreting radiologist was not present at time of operation. FLUOROSCOPY TIME:  25 seconds COMPARISON:  RIGHT hip radiograph December 10, 2015 FINDINGS:  Single fluoroscopic spot view of LEFT hip demonstrates total arthroplasty. IMPRESSION: Intraoperative spot view demonstrating LEFT hip total arthroplasty. Electronically Signed   By: Elon Alas M.D.   On: 02/28/2016 19:06    Disposition: 01-Home or Self Care  Discharge Instructions    Call MD / Call 911    Complete by:  As directed    If you experience chest pain or shortness of breath, CALL 911 and be transported to the hospital emergency room.  If you develope a fever above 101 F, pus (white drainage) or increased drainage or redness at the wound, or calf pain, call your surgeon's office.   Constipation Prevention    Complete by:  As directed    Drink plenty of fluids.  Prune juice may be helpful.  You may use a stool softener, such as Colace (over the counter) 100 mg twice a day.  Use MiraLax (  over the counter) for constipation as needed.   Diet - low sodium heart healthy    Complete by:  As directed    Driving restrictions    Complete by:  As directed    No driving for 2 weeks   Follow the hip precautions as taught in Physical Therapy    Complete by:  As directed    Increase activity slowly as tolerated    Complete by:  As directed    Patient may shower    Complete by:  As directed    You may shower without a dressing once there is no drainage.  Do not wash over the wound.  If drainage remains, cover wound with plastic wrap and then shower.      Follow-up Information    Kerin Salen, MD Follow up in 2 week(s).   Specialty:  Orthopedic Surgery Contact information: [place] [address] [phone]            Signed: Hardin Negus, ERIC R 03/01/2016, 8:16 AM

## 2016-03-01 NOTE — Progress Notes (Signed)
Physical Therapy Treatment Patient Details Name: Peter [name] MRN: [id number] DOB: [birth year] Today's Date: 03/01/2016    History of Present Illness Patient is a 60 y/o male with hx of depression, anxiety and Rt THA presents s/p left THA, direct anterior approach.    PT Comments    Patient feeling well this morning. Tolerated negotiating 1 flight of steps and ambulating 550' Mod I. Provided handout and instructed pt in standing exercises. All education completed and all goals have been met. Pt safe to discharge from a mobility standpoint as pt will have supervision at home. Discharge from therapy.   Follow Up Recommendations  Home health PT     Equipment Recommendations  Rolling walker with 5" wheels    Recommendations for Other Services       Precautions / Restrictions Precautions Precautions: None Restrictions Weight Bearing Restrictions: Yes LLE Weight Bearing: Weight bearing as tolerated    Mobility  Bed Mobility               General bed mobility comments: Up in chair upon PT arrival.   Transfers Overall transfer level: Needs assistance Equipment used: Rolling walker (2 wheeled) Transfers: Sit to/from Stand Sit to Stand: Modified independent (Device/Increase time)         General transfer comment: Stood from chair x1, good demo of safe technique.  Ambulation/Gait Ambulation/Gait assistance: Modified independent (Device/Increase time) Ambulation Distance (Feet): 550 Feet Assistive device: Rolling walker (2 wheeled) Gait Pattern/deviations: Step-through pattern;Decreased stride length Gait velocity: decreased Gait velocity interpretation: Below normal speed for age/gender General Gait Details: Steady gait. Less WB through UEs today.   Stairs Stairs: Yes Stairs assistance: Modified independent (Device/Increase time) Stair Management: Step to pattern Number of Stairs: 13 General stair comments: Cues for technique and safety.   Wheelchair  Mobility    Modified Rankin (Stroke Patients Only)       Balance Overall balance assessment: Needs assistance Sitting-balance support: Feet supported;No upper extremity supported Sitting balance-Leahy Scale: Good     Standing balance support: During functional activity Standing balance-Leahy Scale: Good                      Cognition Arousal/Alertness: Awake/alert Behavior During Therapy: WFL for tasks assessed/performed Overall Cognitive Status: Within Functional Limits for tasks assessed                      Exercises Total Joint Exercises Hip ABduction/ADduction: Strengthening;Both;10 reps;Standing Marching in Standing: Both;10 reps;Strengthening;Standing Standing Hip Extension: Both;10 reps;Standing;Strengthening    General Comments        Pertinent Vitals/Pain Pain Assessment: Faces Faces Pain Scale: Hurts a little bit Pain Location: left hip Pain Descriptors / Indicators: Sore;Operative site guarding Pain Intervention(s): Monitored during session;Repositioned    Home Living                      Prior Function            PT Goals (current goals can now be found in the care plan section) Progress towards PT goals: Goals met/education completed, patient discharged from PT    Frequency    7X/week      PT Plan Current plan remains appropriate    Co-evaluation             End of Session Equipment Utilized During Treatment: Gait belt Activity Tolerance: Patient tolerated treatment well Patient left: in chair;with call bell/phone within reach     Time:  0071-2197 PT Time Calculation (min) (ACUTE ONLY): 25 min  Charges:  $Gait Training: 8-22 mins $Therapeutic Exercise: 8-22 mins                    G Codes:      Saidee Geremia A Ketsia Linebaugh 03/01/2016, 10:05 AM Wray Kearns, PT, DPT [phone]

## 2016-03-01 NOTE — Progress Notes (Signed)
PATIENT ID: KYVAN WEST  MRN: [id number]  DOB/AGE:  1955/11/16 / 60 y.o.  2 Days Post-Op Procedure(s) (LRB): TOTAL HIP ARTHROPLASTY ANTERIOR APPROACH (Left)    PROGRESS NOTE Subjective: Patient is alert, oriented, no Nausea, no Vomiting, yes passing gas, . Taking PO well. Denies SOB, Chest or Calf Pain. Using Incentive Spirometer, PAS in place. Ambulate WBAT with pt walking 300 ft Patient reports pain as mild .    Objective: Vital signs in last 24 hours: Vitals:   02/29/16 1231 02/29/16 1300 02/29/16 1900 03/01/16 0545  BP: 103/80 106/70 124/75 127/67  Pulse: 98 89 85 82  Resp:  16 16 16   Temp: 98.3 F (36.8 C) 98.7 F (37.1 C) 98.2 F (36.8 C) 98.1 F (36.7 C)  TempSrc: Oral Oral Oral Oral  SpO2: 100% 93% 97% 97%  Weight:      Height:          Intake/Output from previous day: I/O last 3 completed shifts: In: 3689.2 [P.O.:870; I.V.:2819.2] Out: 1710 [Urine:1710]   Intake/Output this shift: No intake/output data recorded.   LABORATORY DATA:  Recent Labs  02/29/16 0936  WBC 11.3*  HGB 11.4*  HCT 34.2*  PLT 208  NA 138  K 4.5  CL 107  CO2 28  BUN 7  CREATININE 0.95  GLUCOSE 117*  CALCIUM 8.7*    Examination: Neurologically intact Neurovascular intact Sensation intact distally Intact pulses distally Dorsiflexion/Plantar flexion intact Incision: dressing C/D/I No cellulitis present Compartment soft} XR AP&Lat of hip shows well placed\fixed THA  Assessment:   2 Days Post-Op Procedure(s) (LRB): TOTAL HIP ARTHROPLASTY ANTERIOR APPROACH (Left) ADDITIONAL DIAGNOSIS:  Expected Acute Blood Loss Anemia,   Plan: PT/OT WBAT, THA  DVT Prophylaxis: SCDx72 hrs, ASA 325 mg BID x 2 weeks  DISCHARGE PLAN: Home  DISCHARGE NEEDS: HHPT, Walker and 3-in-1 comode seat

## 2016-03-01 NOTE — Care Management Note (Signed)
Case Management Note  Patient Details  Name: Peter [name] MRN: [id number] Date of Birth: 1956-01-28  Subjective/Objective:    60 yr old gentleman s/p left total hip arthroplasty.               Action/Plan: Case manager spoke with patient concerning [place] and DME needs. Patient was preoperatively setup with [place], no changes. He has rolling walker and 3in1, will have family support at discharge.    Expected Discharge Date:    03/01/16              Expected Discharge Plan:  [place]  In-House Referral:     Discharge planning Services  CM Consult  Post Acute Care Choice:  Home Health Choice offered to:  Patient  DME Arranged:  N/A DME Agency:  NA  HH Arranged:  PT [place] Agency:  [place]  Status of Service:  Completed, signed off  If discussed at [facility] of Stay Meetings, dates discussed:    Additional Comments:  Ninfa Meeker, RN 03/01/2016, 9:54 AM

## 2016-03-01 NOTE — Progress Notes (Signed)
Occupational Therapy Treatment Patient Details Name: Peter [name] MRN: [id number] DOB: [birth year] Today's Date: 03/01/2016    History of present illness Patient is a 60 y/o male with hx of depression, anxiety and Rt THA presents s/p left THA, direct anterior approach.   OT comments  Pt with good progress with OT during acute stay. Pt demonstrates modified independence with ADL and requires supervision for tub transfer. D/C home with 24 hour supervision remains appropriate. Pt has received 3-in-1 for DME needs. All education complete and pt denies questions/concerns. Pt has no further acute OT needs. OT signing off.   Follow Up Recommendations  No OT follow up;Supervision/Assistance - 24 hour    Equipment Recommendations  3 in 1 bedside comode       Precautions / Restrictions Precautions Precautions: None Restrictions Weight Bearing Restrictions: Yes LLE Weight Bearing: Weight bearing as tolerated       Mobility Bed Mobility Overal bed mobility: Needs Assistance Bed Mobility: Supine to Sit;Sit to Supine     Supine to sit: Modified independent (Device/Increase time) Sit to supine: Modified independent (Device/Increase time)   General bed mobility comments: Up in chair upon PT arrival.   Transfers Overall transfer level: Needs assistance Equipment used: Rolling walker (2 wheeled) Transfers: Sit to/from Stand Sit to Stand: Modified independent (Device/Increase time)         General transfer comment: Mod I for toilet transfer. Supervision for tub transfer at this time.    Balance Overall balance assessment: Needs assistance Sitting-balance support: No upper extremity supported;Feet supported Sitting balance-Leahy Scale: Good     Standing balance support: During functional activity;No upper extremity supported Standing balance-Leahy Scale: Good Standing balance comment: Pt able to stand for grooming and dressing tasks with no UE support.                    ADL Overall ADL's : Needs assistance/impaired         Upper Body Bathing: Set up;Sitting   Lower Body Bathing: Set up;Sit to/from stand   Upper Body Dressing : Set up;Sitting   Lower Body Dressing: Set up;Sit to/from stand   Toilet Transfer: BSC;Ambulation;RW;Modified Independent   Toileting- Clothing Manipulation and Hygiene: Sit to/from stand;Modified independent   Tub/ Banker: Tub transfer;3 in 1;Rolling walker   Functional mobility during ADLs: Supervision/safety;Rolling walker General ADL Comments: Completed education on ADL post-operatively, use of ice for pain management, and safe ADL transfers.                Cognition   Behavior During Therapy: WFL for tasks assessed/performed Overall Cognitive Status: Within Functional Limits for tasks assessed                                    Pertinent Vitals/ Pain       Pain Assessment: 0-10 Pain Score: 5  Faces Pain Scale: Hurts a little bit Pain Location: L hip Pain Descriptors / Indicators: Aching;Sore Pain Intervention(s): Monitored during session;Repositioned         Frequency  Min 2X/week        Progress Toward Goals  OT Goals(current goals can now be found in the care plan section)  Progress towards OT goals: Goals met/education completed, patient discharged from OT  Acute Rehab OT Goals Patient Stated Goal: to get back to mountain biking OT Goal Formulation: With patient Time For Goal Achievement: 03/14/16 Potential to Achieve Goals:  Good ADL Goals Pt Will Perform Lower Body Dressing: with modified independence;sit to/from stand Pt Will Transfer to Toilet: with modified independence;ambulating;bedside commode Pt Will Perform Tub/Shower Transfer: with supervision;3 in 1;rolling walker;ambulating  Plan All goals met and education completed, patient discharged from OT services       End of Session Equipment Utilized During Treatment: Rolling walker   Activity Tolerance  Patient tolerated treatment well   Patient Left with call bell/phone within reach;Other (comment) (With PT)   Nurse Communication          Time: 1683-7290 OT Time Calculation (min): 32 min  Charges: OT General Charges $OT Visit: 1 Procedure OT Treatments $Self Care/Home Management : 23-37 mins  Norman Herrlich, OTR/L [phone] 03/01/2016, 10:16 AM

## 2016-03-10 ENCOUNTER — Ambulatory Visit (INDEPENDENT_AMBULATORY_CARE_PROVIDER_SITE_OTHER): Payer: Medicaid Other | Admitting: Internal Medicine

## 2016-03-10 ENCOUNTER — Encounter: Payer: Self-pay | Admitting: Internal Medicine

## 2016-03-10 VITALS — BP 128/80 | HR 95 | Resp 20 | Wt 169.0 lb

## 2016-03-10 DIAGNOSIS — G8929 Other chronic pain: Secondary | ICD-10-CM

## 2016-03-10 DIAGNOSIS — L989 Disorder of the skin and subcutaneous tissue, unspecified: Secondary | ICD-10-CM | POA: Diagnosis not present

## 2016-03-10 DIAGNOSIS — F411 Generalized anxiety disorder: Secondary | ICD-10-CM | POA: Diagnosis not present

## 2016-03-10 DIAGNOSIS — M25551 Pain in right hip: Secondary | ICD-10-CM | POA: Diagnosis not present

## 2016-03-10 DIAGNOSIS — E785 Hyperlipidemia, unspecified: Secondary | ICD-10-CM | POA: Insufficient documentation

## 2016-03-10 MED ORDER — LOVASTATIN 40 MG PO TABS: 40.0000 mg | ORAL_TABLET | Freq: Every day | ORAL | 3 refills | Status: DC

## 2016-03-10 MED ORDER — TRAMADOL HCL 50 MG PO TABS: 50.0000 mg | ORAL_TABLET | Freq: Four times a day (QID) | ORAL | 2 refills | Status: DC | PRN

## 2016-03-10 NOTE — Assessment & Plan Note (Signed)
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

## 2016-03-10 NOTE — Patient Instructions (Addendum)
You had the tetanus shot today  OK to start the lovastatin 40 mg per day  Please go to the LAB in the Basement (turn left off the elevator) for the tests to be done in 6 weeks - 3 months  Please continue all other medications as before, and refills have been done if requested - the tramadol  Please have the pharmacy call with any other refills you may need.  Please keep your appointments with your specialists as you may have planned

## 2016-03-10 NOTE — Assessment & Plan Note (Signed)
C/w benign lesion, ok to follow, declines dem referral due to cost

## 2016-03-10 NOTE — Assessment & Plan Note (Signed)
stable overall by history and exam, recent data reviewed with pt, and pt to continue medical treatment as before,  to f/u any worsening symptoms or concerns Lab Results  Component Value Date   WBC 15.5 (H) 03/01/2016   HGB 10.8 (L) 03/01/2016   HCT 32.4 (L) 03/01/2016   PLT 227 03/01/2016   GLUCOSE 117 (H) 02/29/2016   CHOL 224 (H) 02/10/2016   TRIG 219.0 (H) 02/10/2016   HDL 53.20 02/10/2016   LDLDIRECT 146.0 02/10/2016   ALT 18 12/18/2014   AST 23 12/18/2014   NA 138 02/29/2016   K 4.5 02/29/2016   CL 107 02/29/2016   CREATININE 0.95 02/29/2016   BUN 7 02/29/2016   CO2 28 02/29/2016   TSH 2.38 12/18/2014   INR 0.96 02/17/2016

## 2016-03-10 NOTE — Progress Notes (Signed)
Pre visit review using our clinic review tool, if applicable. No additional management support is needed unless otherwise documented below in the visit note. 

## 2016-03-10 NOTE — Progress Notes (Signed)
Subjective:    Patient ID: Peter [name], male    DOB: 04/29/1956, 60 y.o.   MRN: [id number]  HPI  Here t o/fu after left hip THA, with some post surgical fatigue, off the walker,  Pt denies fever, night sweats, loss of appetite, or other constitutional symptoms, except for a few lbs wt loss. Wt Readings from Last 3 Encounters:  03/10/16 169 lb (76.7 kg)  02/28/16 174 lb (78.9 kg)  02/17/16 174 lb (78.9 kg)  Now off the oxycodone with some aching but had constipation and got off asap, getting by back on tramadol.  Pt denies chest pain, increased sob or doe, wheezing, orthopnea, PND, increased LE swelling, palpitations, dizziness or syncope. No other new complaints Denies worsening depressive symptoms, suicidal ideation, or panic; has ongoing anxiety  Due for Tdap today Past Medical History:  Diagnosis Date  . Anxiety state, unspecified 12/30/2012  . Arthritis   . Depression   . GERD (gastroesophageal reflux disease)   . Migraine   . Neck pain 11/22/2014   Past Surgical History:  Procedure Laterality Date  . right hip revision  2007  . right hip THA  2002  . TONSILLECTOMY    . 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 tumor removed      reports that he has never smoked. He has never used smokeless tobacco. He reports that he drinks alcohol. He reports that he uses drugs, including Marijuana. family history is not on file. Allergies  Allergen Reactions  . Lunesta [Eszopiclone] Anaphylaxis and Other (See Comments)    Metal taste in mouth, lungs filled with fluid  . Demerol [Meperidine] Other (See Comments)    hallucinations   Current Outpatient Prescriptions on File Prior to Visit  Medication Sig Dispense Refill  . aspirin EC 325 MG tablet Take 1 tablet (325 mg total) by mouth 2 (two) times daily. 30 tablet 0  . b complex vitamins tablet  Take 1 tablet by mouth daily.    . clonazePAM (KLONOPIN) 0.5 MG tablet Take 1 tablet (0.5 mg total) by mouth 2 (two) times daily as needed for anxiety. ---make office visit with pcp for further refills 60 tablet 5  . Cyanocobalamin (B-12) 1000 MCG CAPS Take 1,000 mcg by mouth daily.    . Melatonin 10 MG TABS Take 10 mg by mouth at bedtime as needed (for sleep). Alternates with Ambien every other week    . Menthol, Topical Analgesic, (BIOFREEZE ROLL-ON) 4 % GEL Apply 1 application topically at bedtime as needed (back pain).    . Multiple Vitamin (MULTIVITAMIN WITH MINERALS) TABS tablet Take 1 tablet by mouth daily.    . Omega-3 Krill Oil 300 MG CAPS Take 300 mg by mouth daily.    Marland Kitchen omeprazole (PRILOSEC) 20 MG capsule Take 1 capsule (20 mg total) by mouth daily. 90 capsule 3  . SUMAtriptan (IMITREX) 100 MG tablet Take 1 tablet (100 mg total) by mouth every 2 (two) hours as needed for migraine or headache. May repeat in 2 hours if headache persists or recurs. 10 tablet 5  . tiZANidine (ZANAFLEX) 2 MG tablet Take 1 tablet (2 mg total) by mouth every 6 (six) hours as needed for muscle spasms. 60 tablet 0  . zolpidem (AMBIEN) 10 MG tablet Take 1 tablet (10 mg total) by mouth at bedtime as needed. for sleep (Patient  taking differently: Take 10 mg by mouth at bedtime as needed for sleep. Alternates with melatonin every other week) 30 tablet 5   No current facility-administered medications on file prior to visit.    Review of Systems  Constitutional: Negative for unusual diaphoresis or night sweats HENT: Negative for ear swelling or discharge Eyes: Negative for worsening visual haziness  Respiratory: Negative for choking and stridor.   Gastrointestinal: Negative for distension or worsening eructation Genitourinary: Negative for retention or change in urine volume.  Musculoskeletal: Negative for other MSK pain or swelling Skin: Negative for color change and worsening wound Neurological: Negative for  tremors and numbness other than noted  Psychiatric/Behavioral: Negative for decreased concentration or agitation other than above       Objective:   Physical Exam BP 128/80   Pulse 95   Resp 20   Wt 169 lb (76.7 kg)   SpO2 96%   BMI 25.32 kg/m  VS noted,  Constitutional: Pt appears in no apparent distress HENT: Head: NCAT.  Right Ear: External ear normal.  Left Ear: External ear normal.  Eyes: . Pupils are equal, round, and reactive to light. Conjunctivae and EOM are normal Neck: Normal range of motion. Neck supple.  Cardiovascular: Normal rate and regular rhythm.   Pulmonary/Chest: Effort normal and breath sounds without rales or wheezing.  Neurological: Pt is alert. Not confused , motor grossly intact Skin: Skin is warm. No rash, no LE edema, left arm lateral epicondylar area with 5 mm non raised "liver spot" without black, irreg edges, two toned color or raised Psychiatric: Pt behavior is normal. No agitation.     Assessment & Plan:

## 2016-03-10 NOTE — Assessment & Plan Note (Signed)
Mod uncontrolled, pt willing to start lovastatin after d/w pt today, with f/u lab at 6 wks - 3 mo, for lower chol diet Lab Results  Component Value Date   CHOL 224 (H) 02/10/2016   HDL 53.20 02/10/2016   LDLDIRECT 146.0 02/10/2016   TRIG 219.0 (H) 02/10/2016   CHOLHDL 4 02/10/2016

## 2016-03-13 ENCOUNTER — Encounter: Payer: Self-pay | Admitting: Gastroenterology

## 2016-03-13 ENCOUNTER — Encounter: Payer: Self-pay | Admitting: Internal Medicine

## 2016-03-16 ENCOUNTER — Telehealth: Payer: Self-pay | Admitting: Internal Medicine

## 2016-03-16 DIAGNOSIS — L989 Disorder of the skin and subcutaneous tissue, unspecified: Secondary | ICD-10-CM

## 2016-03-16 NOTE — Telephone Encounter (Signed)
Requests referral for Dermatology. Pt has medicaid but I am unsure if we can do referrals for medicaid.

## 2016-03-16 NOTE — Telephone Encounter (Signed)
We can now.

## 2016-03-16 NOTE — Telephone Encounter (Signed)
Referral done

## 2016-03-16 NOTE — Telephone Encounter (Signed)
Dermatology referral requested

## 2016-03-16 NOTE — Telephone Encounter (Signed)
Patient is requesting a referral to a dermatologist for a full body check.  Patient has medicaid that will run out the end of the month and would like to get in with a dermatologist before then.

## 2016-03-22 ENCOUNTER — Telehealth: Payer: Self-pay | Admitting: Gastroenterology

## 2016-03-22 ENCOUNTER — Ambulatory Visit (AMBULATORY_SURGERY_CENTER): Payer: Medicaid Other | Admitting: *Deleted

## 2016-03-22 ENCOUNTER — Encounter: Payer: Self-pay | Admitting: Gastroenterology

## 2016-03-22 VITALS — Ht 68.5 in | Wt 173.0 lb

## 2016-03-22 DIAGNOSIS — Z1211 Encounter for screening for malignant neoplasm of colon: Secondary | ICD-10-CM

## 2016-03-22 MED ORDER — NA SULFATE-K SULFATE-MG SULF 17.5-3.13-1.6 GM/177ML PO SOLN: 1.0000 | Freq: Once | ORAL | 0 refills | Status: AC

## 2016-03-22 NOTE — Progress Notes (Signed)
No egg or soy allergy. No anesthesia problems.  No home O2.  No diet meds.  

## 2016-03-22 NOTE — Telephone Encounter (Signed)
Pt has his paper that he documented med hx on and was not sure if he needed to bring it here- informed all this was asked in his PV today so we do not need it.  Encouraged to call with questions  Marijean Niemann

## 2016-04-03 ENCOUNTER — Telehealth: Payer: Self-pay | Admitting: Gastroenterology

## 2016-04-04 ENCOUNTER — Ambulatory Visit (AMBULATORY_SURGERY_CENTER): Payer: Medicaid Other | Admitting: Gastroenterology

## 2016-04-04 ENCOUNTER — Encounter: Payer: Self-pay | Admitting: Gastroenterology

## 2016-04-04 VITALS — BP 154/95 | HR 81 | Temp 97.7°F | Resp 35 | Ht 68.0 in | Wt 173.0 lb

## 2016-04-04 DIAGNOSIS — Z1212 Encounter for screening for malignant neoplasm of rectum: Secondary | ICD-10-CM | POA: Diagnosis not present

## 2016-04-04 DIAGNOSIS — Z1211 Encounter for screening for malignant neoplasm of colon: Secondary | ICD-10-CM | POA: Diagnosis not present

## 2016-04-04 DIAGNOSIS — D125 Benign neoplasm of sigmoid colon: Secondary | ICD-10-CM

## 2016-04-04 DIAGNOSIS — D124 Benign neoplasm of descending colon: Secondary | ICD-10-CM

## 2016-04-04 MED ORDER — SODIUM CHLORIDE 0.9 % IV SOLN: 500.0000 mL | INTRAVENOUS | Status: DC

## 2016-04-04 NOTE — Telephone Encounter (Signed)
Spoke with pt who states nurse "got back with him yesterday"  No further questions at this time.

## 2016-04-04 NOTE — Progress Notes (Signed)
Called to room to assist during endoscopic procedure.  Patient ID and intended procedure confirmed with present staff. Received instructions for my participation in the procedure from the performing physician.  

## 2016-04-04 NOTE — Op Note (Signed)
[facility] Patient Name: Peter [name] Procedure Date: 04/04/2016 2:00 PM MRN: [id number] Endoscopist: Mallie Mussel L. Loletha Carrow , MD Age: 60 Referring MD:  Date of Birth: 1955/09/05 Gender: Male Account #: [id number] Procedure:                Colonoscopy Indications:              Screening for colorectal malignant neoplasm, This                            is the patient's first colonoscopy Medicines:                Monitored Anesthesia Care Procedure:                Pre-Anesthesia Assessment:                           - Prior to the procedure, a History and Physical                            was performed, and patient medications and                            allergies were reviewed. The patient's tolerance of                            previous anesthesia was also reviewed. The risks                            and benefits of the procedure and the sedation                            options and risks were discussed with the patient.                            All questions were answered, and informed consent                            was obtained. Anticoagulants: The patient has taken                            aspirin. It was decided not to withhold this                            medication prior to the procedure. ASA Grade                            Assessment: II - A patient with mild systemic                            disease. After reviewing the risks and benefits,                            the patient was deemed in satisfactory condition to  undergo the procedure.                           After obtaining informed consent, the colonoscope                            was passed under direct vision. Throughout the                            procedure, the patient's blood pressure, pulse, and                            oxygen saturations were monitored continuously. The                            Model PCF-H190DL [id number]) scope was introduced                            through the anus and advanced to the the cecum,                            identified by appendiceal orifice and ileocecal                            valve. The colonoscopy was performed without                            difficulty. The patient tolerated the procedure                            well. The quality of the bowel preparation was                            excellent. The ileocecal valve, appendiceal                            orifice, and rectum were photographed. The quality                            of the bowel preparation was evaluated using the                            BBPS [facility] Bowel Preparation Scale) with scores                            of: Right Colon = 3, Transverse Colon = 3 and Left                            Colon = 3 (entire mucosa seen well with no residual                            staining, small fragments of stool or opaque  liquid). The total BBPS score equals 9. The bowel                            preparation used was SUPREP. Scope In: 2:23:34 PM Scope Out: 2:39:45 PM Scope Withdrawal Time: 0 hours 13 minutes 41 seconds  Total Procedure Duration: 0 hours 16 minutes 11 seconds  Findings:                 The perianal and digital rectal examinations were                            normal.                           Multiple small-mouthed diverticula were found from                            hepatic flexure to sigmoid colon.                           A 5 mm polyp was found in the mid descending colon.                            The polyp was sessile. The polyp was removed with a                            cold snare. Resection and retrieval were complete.                           A 12 mm polyp was found in the mid sigmoid colon.                            The polyp was pedunculated. To prevent bleeding                            after the polypectomy, two hemostatic clips were                             successfully placed prior to polypectomy. The polyp                            was removed with a hot snare. Resection and                            retrieval were complete.                           The exam was otherwise without abnormality on                            direct and retroflexion views. Complications:            No immediate complications. Estimated Blood Loss:     Estimated blood loss: none. Impression:               -  Diverticulosis from hepatic flexure to sigmoid                            colon.                           - One 5 mm polyp in the mid descending colon,                            removed with a cold snare. Resected and retrieved.                           - One 12 mm polyp in the mid sigmoid colon, removed                            with a hot snare. Resected and retrieved. Clips                            were placed.                           - The examination was otherwise normal on direct                            and retroflexion views. Recommendation:           - Patient has a contact number available for                            emergencies. The signs and symptoms of potential                            delayed complications were discussed with the                            patient. Return to normal activities tomorrow.                            Written discharge instructions were provided to the                            patient.                           - Resume previous diet.                           - No aspirin, ibuprofen, naproxen, or other                            non-steroidal anti-inflammatory drugs for 10 days                            after polyp removal.                           -  Await pathology results.                           - Repeat colonoscopy is recommended for                            surveillance. The colonoscopy date will be                            determined after pathology results from today's                             exam become available for review. Kamira Mellette L. Loletha Carrow, MD 04/04/2016 2:46:50 PM This report has been signed electronically.

## 2016-04-04 NOTE — Patient Instructions (Signed)
YOU HAD AN ENDOSCOPIC PROCEDURE TODAY AT [facility]:   Refer to the procedure report that was given to you for any specific questions about what was found during the examination.  If the procedure report does not answer your questions, please call your gastroenterologist to clarify.  If you requested that your care partner not be given the details of your procedure findings, then the procedure report has been included in a sealed envelope for you to review at your convenience later.  YOU SHOULD EXPECT: Some feelings of bloating in the abdomen. Passage of more gas than usual.  Walking can help get rid of the air that was put into your GI tract during the procedure and reduce the bloating. If you had a lower endoscopy (such as a colonoscopy or flexible sigmoidoscopy) you may notice spotting of blood in your stool or on the toilet paper. If you underwent a bowel prep for your procedure, you may not have a normal bowel movement for a few days.  Please Note:  You might notice some irritation and congestion in your nose or some drainage.  This is from the oxygen used during your procedure.  There is no need for concern and it should clear up in a day or so.  SYMPTOMS TO REPORT IMMEDIATELY:   Following lower endoscopy (colonoscopy or flexible sigmoidoscopy):  Excessive amounts of blood in the stool  Significant tenderness or worsening of abdominal pains  Swelling of the abdomen that is new, acute  Fever of 100F or higher   Following upper endoscopy (EGD)  Vomiting of blood or coffee ground material  New chest pain or pain under the shoulder blades  Painful or persistently difficult swallowing  New shortness of breath  Fever of 100F or higher  Black, tarry-looking stools  For urgent or emergent issues, a gastroenterologist can be reached at any hour by calling [phone].   DIET:  We do recommend a small meal at first, but then you may proceed to your regular diet.  Drink  plenty of fluids but you should avoid alcoholic beverages for 24 hours.  ACTIVITY:  You should plan to take it easy for the rest of today and you should NOT DRIVE or use heavy machinery until tomorrow (because of the sedation medicines used during the test).    FOLLOW UP: Our staff will call the number listed on your records the next business day following your procedure to check on you and address any questions or concerns that you may have regarding the information given to you following your procedure. If we do not reach you, we will leave a message.  However, if you are feeling well and you are not experiencing any problems, there is no need to return our call.  We will assume that you have returned to your regular daily activities without incident.  If any biopsies were taken you will be contacted by phone or by letter within the next 1-3 weeks.  Please call us at [phone] if you have not heard about the biopsies in 3 weeks.    SIGNATURES/CONFIDENTIALITY: You and/or your care partner have signed paperwork which will be entered into your electronic medical record.  These signatures attest to the fact that that the information above on your After Visit Summary has been reviewed and is understood.  Full responsibility of the confidentiality of this discharge information lies with you and/or your care-partner.   A card to carry in your wallet was given to  you today to indicate 2 clips were placed at polypectomy site in sigmoid colon  NO ASPIRIN,IBUPROFEN,NAPROXEN,OR OTHER NON STEROIDAL ANTI INFLAMMATORY PRODUCTS FOR 10 DAYS AFTER POLYP REMOVAL  AWAIT LETTER FROM DR Loletha Carrow WITH REPORT OF PATHOLOGY

## 2016-04-04 NOTE — Progress Notes (Signed)
Report to PACU, RN, vss, BBS= Clear.  

## 2016-04-05 ENCOUNTER — Telehealth: Payer: Self-pay

## 2016-04-05 NOTE — Telephone Encounter (Signed)
  Follow up Call-  Call back number 04/04/2016  Post procedure Call Back phone  # [phone]  Permission to leave phone message Yes  Some recent data might be hidden    Patient expressed his gratitude for the wonderful care that he received while in the [place]. Patient states that his entire experience was very positive and he intends to make sure that others get a colonoscopy.    Patient questions:  Do you have a fever, pain , or abdominal swelling? No. Pain Score  0 *  Have you tolerated food without any problems? Yes.    Have you been able to return to your normal activities? Yes.    Do you have any questions about your discharge instructions: Diet   No. Medications  No. Follow up visit  No.  Do you have questions or concerns about your Care? No.  Actions: * If pain score is 4 or above: No action needed, pain <4.

## 2016-04-11 ENCOUNTER — Telehealth: Payer: Self-pay | Admitting: Gastroenterology

## 2016-04-11 NOTE — Telephone Encounter (Signed)
Patient was wondering if you have had a chance to look at the pathology results yet. Thank you.

## 2016-04-11 NOTE — Telephone Encounter (Signed)
Called pathology department, Dr. Saralyn Pilar has not signed out the results yet. They will contact him tomorrow to see what the status is on this path and contact our office.

## 2016-04-11 NOTE — Telephone Encounter (Signed)
I do not seem to have received it from the pathology department yet. Please call them and ask for it.  Then I will review and send him a letter soon.

## 2016-04-12 NOTE — Telephone Encounter (Signed)
Dr. Saralyn Pilar called back, stated that the path results are ready, you should be able to see them in the system now. Both were benign adenomas.

## 2016-04-12 NOTE — Telephone Encounter (Signed)
I do not see it under the results review.  Also, the report needs to come to my results box per usual so I can both review it and generate a letter for him.

## 2016-04-13 ENCOUNTER — Encounter: Payer: Self-pay | Admitting: Gastroenterology

## 2016-05-18 ENCOUNTER — Other Ambulatory Visit: Payer: Self-pay | Admitting: Internal Medicine

## 2016-05-18 NOTE — Telephone Encounter (Signed)
faxed

## 2016-05-18 NOTE — Telephone Encounter (Signed)
Done hardcopy to Corinne  

## 2016-05-19 NOTE — Telephone Encounter (Signed)
faxed

## 2016-05-19 NOTE — Telephone Encounter (Signed)
Done hardcopy to Corinne  

## 2016-11-07 ENCOUNTER — Other Ambulatory Visit: Payer: Self-pay | Admitting: Internal Medicine

## 2016-11-07 NOTE — Telephone Encounter (Signed)
Faxed

## 2016-11-07 NOTE — Telephone Encounter (Signed)
Done hardcopy to [facility]  

## 2016-11-26 ENCOUNTER — Other Ambulatory Visit: Payer: Self-pay | Admitting: Internal Medicine

## 2016-11-28 NOTE — Telephone Encounter (Signed)
Done hardcopy to [facility]  

## 2016-11-28 NOTE — Telephone Encounter (Signed)
faxed

## 2016-11-28 NOTE — Telephone Encounter (Signed)
Patient has called in.  I have notified him that script has been faxed.

## 2017-02-20 ENCOUNTER — Telehealth: Payer: Self-pay | Admitting: Internal Medicine

## 2017-02-20 NOTE — Telephone Encounter (Signed)
Patient has some questions about a Flu shot. He would like to speak with the nurse about it. Please follow up with him. Thank you.

## 2017-02-20 NOTE — Telephone Encounter (Signed)
Spoke with patient about flu shot., He had questions about billing, transferred him to the front.

## 2017-02-26 ENCOUNTER — Other Ambulatory Visit: Payer: Self-pay | Admitting: Internal Medicine

## 2017-02-26 NOTE — Telephone Encounter (Signed)
Done hardcopy to [facility]  

## 2017-02-27 NOTE — Telephone Encounter (Signed)
Faxed

## 2017-03-02 ENCOUNTER — Encounter: Payer: Self-pay | Admitting: Nurse Practitioner

## 2017-03-02 ENCOUNTER — Other Ambulatory Visit (INDEPENDENT_AMBULATORY_CARE_PROVIDER_SITE_OTHER): Payer: Worker's Compensation

## 2017-03-02 ENCOUNTER — Ambulatory Visit (INDEPENDENT_AMBULATORY_CARE_PROVIDER_SITE_OTHER): Payer: Self-pay | Admitting: Nurse Practitioner

## 2017-03-02 VITALS — BP 128/82 | HR 67 | Temp 97.5°F | Ht 68.0 in | Wt 178.0 lb

## 2017-03-02 DIAGNOSIS — R682 Dry mouth, unspecified: Secondary | ICD-10-CM

## 2017-03-02 DIAGNOSIS — G4700 Insomnia, unspecified: Secondary | ICD-10-CM

## 2017-03-02 DIAGNOSIS — G43009 Migraine without aura, not intractable, without status migrainosus: Secondary | ICD-10-CM

## 2017-03-02 DIAGNOSIS — F411 Generalized anxiety disorder: Secondary | ICD-10-CM

## 2017-03-02 LAB — CBC WITH DIFFERENTIAL/PLATELET
BASOS ABS: 0.1 10*3/uL (ref 0.0–0.1)
BASOS PCT: 1 % (ref 0.0–3.0)
EOS PCT: 0.8 % (ref 0.0–5.0)
Eosinophils Absolute: 0.1 10*3/uL (ref 0.0–0.7)
HEMATOCRIT: 43.5 % (ref 39.0–52.0)
Hemoglobin: 14.3 g/dL (ref 13.0–17.0)
LYMPHS PCT: 22.4 % (ref 12.0–46.0)
Lymphs Abs: 1.6 10*3/uL (ref 0.7–4.0)
MCHC: 32.8 g/dL (ref 30.0–36.0)
MCV: 88.3 fl (ref 78.0–100.0)
MONOS PCT: 11.4 % (ref 3.0–12.0)
Monocytes Absolute: 0.8 10*3/uL (ref 0.1–1.0)
NEUTROS ABS: 4.5 10*3/uL (ref 1.4–7.7)
Neutrophils Relative %: 64.4 % (ref 43.0–77.0)
PLATELETS: 296 10*3/uL (ref 150.0–400.0)
RBC: 4.93 Mil/uL (ref 4.22–5.81)
RDW: 18.4 % — ABNORMAL HIGH (ref 11.5–15.5)
WBC: 7 10*3/uL (ref 4.0–10.5)

## 2017-03-02 LAB — BASIC METABOLIC PANEL
BUN: 18 mg/dL (ref 6–23)
CALCIUM: 9.9 mg/dL (ref 8.4–10.5)
CHLORIDE: 100 meq/L (ref 96–112)
CO2: 28 meq/L (ref 19–32)
Creatinine, Ser: 0.96 mg/dL (ref 0.40–1.50)
GFR: 84.91 mL/min (ref >60.00)
Glucose, Bld: 100 mg/dL — ABNORMAL HIGH (ref 70–99)
POTASSIUM: 4.9 meq/L (ref 3.5–5.1)
SODIUM: 138 meq/L (ref 135–145)

## 2017-03-02 LAB — HEMOGLOBIN A1C: HEMOGLOBIN A1C: 6 % (ref 4.6–6.5)

## 2017-03-02 LAB — TSH: TSH: 3.18 u[IU]/mL (ref 0.35–4.50)

## 2017-03-02 MED ORDER — ZALEPLON 10 MG PO CAPS: 10.0000 mg | ORAL_CAPSULE | Freq: Every evening | ORAL | 0 refills | Status: DC | PRN

## 2017-03-02 MED ORDER — BUSPIRONE HCL 7.5 MG PO TABS
7.5000 mg | ORAL_TABLET | Freq: Three times a day (TID) | ORAL | 0 refills | Status: DC | PRN
Start: 2017-03-02 — End: 2017-05-10

## 2017-03-02 MED ORDER — NAPROXEN 500 MG PO TABS: 500.0000 mg | ORAL_TABLET | Freq: Two times a day (BID) | ORAL | 0 refills | Status: DC | PRN

## 2017-03-02 MED ORDER — KETOROLAC TROMETHAMINE 30 MG/ML IJ SOLN
30.0000 mg | Freq: Once | INTRAMUSCULAR | Status: AC
  Administered 2017-03-02: 30 mg via INTRAMUSCULAR

## 2017-03-02 NOTE — Progress Notes (Signed)
Subjective:  Patient ID: Peter [name], male    DOB: 1956-04-14  Age: 61 y.o. MRN: [id number]  CC: Headache (headache going on 3 days,near left eye--last a while--just got back on working 1 mo ago. cant sleep--ambien not woring real good. flu shot?)   Migraine   This is a chronic problem. The current episode started in the past 7 days. The problem occurs constantly. The problem has been unchanged. The pain is located in the left unilateral and occipital region. The pain does not radiate. The pain quality is similar to prior headaches. The quality of the pain is described as stabbing and dull. The pain is at a severity of 5/10. Associated symptoms include blurred vision, insomnia and photophobia. Pertinent negatives include no abnormal behavior, anorexia, coughing, dizziness, eye redness, facial sweating, hearing loss, loss of balance, muscle aches, nausea, neck pain, numbness, phonophobia, rhinorrhea, scalp tenderness, seizures, sinus pressure, sore throat, tingling, tinnitus, vomiting or weakness. The symptoms are aggravated by bright light, emotional stress and fatigue. He has tried triptans and acetaminophen for the symptoms. The treatment provided mild relief. His past medical history is significant for migraine headaches. There is no history of hypertension, obesity, recent head traumas, sinus disease or TMJ.  Insomnia  Primary symptoms: fragmented sleep, difficulty falling asleep, no somnolence, premature morning awakening, no malaise/fatigue, no napping.  The current episode started more than one year. The onset quality is undetermined. The problem occurs nightly. The problem has been waxing and waning since onset. The symptoms are aggravated by anxiety. How many beverages per day that contain caffeine: 0 - 1.  Types of beverages you drink: coffee. Nothing relieves the symptoms. Past treatments include medication and other (ambien, benadryl, melatonin, exercise). The treatment provided no relief.  Typical bedtime:  8-10 P.M..  How long after going to bed to you fall asleep: over an hour.   PMH includes: depression. Prior diagnostic workup includes:  Blood work.  Anxiety  Presents for follow-up visit. Symptoms include decreased concentration, dry mouth, excessive worry, insomnia, muscle tension, nervous/anxious behavior and restlessness. Patient reports no chest pain, confusion, depressed mood, dizziness, feeling of choking, hyperventilation, irritability, malaise, nausea, palpitations, panic or suicidal ideas. Symptoms occur constantly. The quality of sleep is poor. Nighttime awakenings: several.   Compliance with medications is 76-100%.  will like ambien changed but has no insurance coverage.  Headache (left side, behind eye): No change from previous episodes. Relief with imitrex and tylenol. Associated with fatigue and chills. No URI,  Light sensitivity and blurry vision (not new).  Insomnia: Increased anxiety despite use of klonopin, has racing thoughts Sleeps about 3hrs a day. Daytime fatigue. Feels restless. No ETOh use. No tobacco. 1cup of coffee  Anxiety: Feel restless and has racing thoughts Use of lexapro in past; caused suicidal thoughts, make symptoms worse. Mood improves with exercise and staying active and getting out of his home. Will like try another medication for sleep.  Unable to afford psychiatry or psychology visit due to lack of insurance.  Outpatient Medications Prior to Visit  Medication Sig Dispense Refill  . aspirin 81 MG chewable tablet Chew 81 mg by mouth daily.    Marland Kitchen b complex vitamins tablet Take 1 tablet by mouth daily.    . Cyanocobalamin (B-12) 1000 MCG CAPS Take 1,000 mcg by mouth daily.    . Melatonin 10 MG TABS Take 10 mg by mouth at bedtime as needed (for sleep). Alternates with Ambien every other week    . Menthol, Topical  Analgesic, (BIOFREEZE ROLL-ON) 4 % GEL Apply 1 application topically at bedtime as needed (back pain).    .  Multiple Vitamin (MULTIVITAMIN WITH MINERALS) TABS tablet Take 1 tablet by mouth daily.    . Omega-3 Krill Oil 300 MG CAPS Take 300 mg by mouth daily.    Marland Kitchen omeprazole (PRILOSEC) 20 MG capsule Take 1 capsule (20 mg total) by mouth daily. 90 capsule 3  . SUMAtriptan (IMITREX) 100 MG tablet Take 1 tablet (100 mg total) by mouth every 2 (two) hours as needed for migraine or headache. May repeat in 2 hours if headache persists or recurs. 10 tablet 5  . clonazePAM (KLONOPIN) 0.5 MG tablet TAKE 1 TABLET BY MOUTH TWICE A DAY AS NEEDED (Patient not taking: Reported on 03/02/2017) 60 tablet 5  . lovastatin (MEVACOR) 40 MG tablet Take 1 tablet (40 mg total) by mouth at bedtime. (Patient not taking: Reported on 03/02/2017) 90 tablet 3  . oxyCODONE-acetaminophen (PERCOCET/ROXICET) 5-325 MG tablet Take 1 tablet by mouth every 4 (four) hours as needed.  0  . tiZANidine (ZANAFLEX) 2 MG tablet Take 1 tablet (2 mg total) by mouth every 6 (six) hours as needed for muscle spasms. (Patient not taking: Reported on 03/22/2016) 60 tablet 0  . traMADol (ULTRAM) 50 MG tablet Take 1 tablet (50 mg total) by mouth every 6 (six) hours as needed. (Patient not taking: Reported on 03/02/2017) 120 tablet 2  . zolpidem (AMBIEN) 10 MG tablet TAKE 1 TABLET BY MOUTH EVERY DAY AT BEDTIME AS NEEDED (Patient not taking: Reported on 03/02/2017) 30 tablet 0   Facility-Administered Medications Prior to Visit  Medication Dose Route Frequency Provider Last Rate Last Dose  . 0.9 %  sodium chloride infusion  500 mL Intravenous Continuous Nelida Meuse III, MD        ROS See HPI  Objective:  BP 128/82   Pulse 67   Temp (!) 97.5 F (36.4 C)   Ht 5\' 8"  (1.727 m)   Wt 178 lb (80.7 kg)   SpO2 99%   BMI 27.06 kg/m   BP Readings from Last 3 Encounters:  03/02/17 128/82  04/04/16 (!) 154/95  03/10/16 128/80    Wt Readings from Last 3 Encounters:  03/02/17 178 lb (80.7 kg)  04/04/16 173 lb (78.5 kg)  03/22/16 173 lb (78.5 kg)     Physical Exam  Constitutional: He is oriented to person, place, and time. No distress.  Eyes: Pupils are equal, round, and reactive to light. Conjunctivae and EOM are normal.  Neck: No thyromegaly present.  Cardiovascular: Normal rate and regular rhythm.   Pulmonary/Chest: Effort normal and breath sounds normal.  Neurological: He is alert and oriented to person, place, and time. Coordination normal.  Skin: Skin is warm and dry.  Psychiatric: Thought content normal.  restless  Vitals reviewed.   Lab Results  Component Value Date   WBC 15.5 (H) 03/01/2016   HGB 10.8 (L) 03/01/2016   HCT 32.4 (L) 03/01/2016   PLT 227 03/01/2016   GLUCOSE 117 (H) 02/29/2016   CHOL 224 (H) 02/10/2016   TRIG 219.0 (H) 02/10/2016   HDL 53.20 02/10/2016   LDLDIRECT 146.0 02/10/2016   ALT 18 12/18/2014   AST 23 12/18/2014   NA 138 02/29/2016   K 4.5 02/29/2016   CL 107 02/29/2016   CREATININE 0.95 02/29/2016   BUN 7 02/29/2016   CO2 28 02/29/2016   TSH 2.38 12/18/2014   INR 0.96 02/17/2016    Dg Chest  2 View  Result Date: 02/17/2016 CLINICAL DATA:  Preop examination. EXAM: CHEST  2 VIEW COMPARISON:  12/13/2014 go FINDINGS: The heart size and mediastinal contours are within normal limits. Both lungs are clear. The visualized skeletal structures are unremarkable. IMPRESSION: No active cardiopulmonary disease. Electronically Signed   By: Nolon Nations M.D.   On: 02/17/2016 14:36    Assessment & Plan:   Mickle was seen today for headache.  Diagnoses and all orders for this visit:  Migraine without aura and without status migrainosus, not intractable -     naproxen (NAPROSYN) 500 MG tablet; Take 1 tablet (500 mg total) by mouth 2 (two) times daily as needed for headache (for pain, take with food). -     ketorolac (TORADOL) 30 MG/ML injection 30 mg; Inject 1 mL (30 mg total) into the muscle once.  Anxiety state -     TSH; Future -     Basic metabolic panel; Future -     CBC w/Diff;  Future -     busPIRone (BUSPAR) 7.5 MG tablet; Take 1 tablet (7.5 mg total) by mouth 3 (three) times daily as needed.  Insomnia, unspecified type -     TSH; Future -     Basic metabolic panel; Future -     CBC w/Diff; Future -     zaleplon (SONATA) 10 MG capsule; Take 1 capsule (10 mg total) by mouth at bedtime as needed for sleep.  Dry mouth -     TSH; Future -     Hemoglobin A1c; Future   I have discontinued Mr. Sagar's SUMAtriptan, tiZANidine, traMADol, oxyCODONE-acetaminophen, and zolpidem. I am also having him start on zaleplon, busPIRone, and naproxen. Additionally, I am having him maintain his multivitamin with minerals, Menthol (Topical Analgesic), b complex vitamins, omeprazole, B-12, Melatonin, Omega-3 Krill Oil, lovastatin, aspirin, clonazePAM, and Acetaminophen (TYLENOL 8 HOUR PO). We administered ketorolac. We will continue to administer sodium chloride.  Meds ordered this encounter  Medications  . Acetaminophen (TYLENOL 8 HOUR PO)    Sig: Take by mouth.  . zaleplon (SONATA) 10 MG capsule    Sig: Take 1 capsule (10 mg total) by mouth at bedtime as needed for sleep.    Dispense:  30 capsule    Refill:  0    Order Specific Question:   Supervising Provider    Answer:   Binnie Rail [[id number]]  . busPIRone (BUSPAR) 7.5 MG tablet    Sig: Take 1 tablet (7.5 mg total) by mouth 3 (three) times daily as needed.    Dispense:  30 tablet    Refill:  0    Order Specific Question:   Supervising Provider    Answer:   Binnie Rail [[id number]]  . naproxen (NAPROSYN) 500 MG tablet    Sig: Take 1 tablet (500 mg total) by mouth 2 (two) times daily as needed for headache (for pain, take with food).    Dispense:  30 tablet    Refill:  0    Order Specific Question:   Supervising Provider    Answer:   Binnie Rail [[id number]]  . ketorolac (TORADOL) 30 MG/ML injection 30 mg     Follow-up: Return in about 4 weeks (around 03/30/2017) for anxiety and insomnia with Dr. Jenny Reichmann.  Wilfred Lacy, NP

## 2017-03-02 NOTE — Patient Instructions (Addendum)
Follow up with Dr. Jenny Reichmann if no improvement in 1week.  Elavil or doxepin not prescribed due to potential side effects (worsening headache).  Stop ambien. Use sonata for sleep.  Use buspar for anxiety.  Ok to use sumatriptan and naproxen for headache.  Take medications with food.  Go to basement for blood draw. You will be called with results

## 2017-03-03 ENCOUNTER — Ambulatory Visit: Payer: Self-pay

## 2017-03-05 ENCOUNTER — Telehealth: Payer: Self-pay | Admitting: Nurse Practitioner

## 2017-03-05 NOTE — Telephone Encounter (Signed)
I did not stop his klonopin. Medication was prescribed to be used prn. wheni saw him, he reported that he was not using medication everyday. This this information, he should not have any withdrawal symptoms

## 2017-03-05 NOTE — Telephone Encounter (Signed)
Pt called for his lab results from 10/19 and would like to know if switching from [facility] to [place] will cause him withdrawal. Please advise and call back

## 2017-03-05 NOTE — Telephone Encounter (Signed)
Left vm for the pt to call back, need to inform massage below. Also see result note.

## 2017-03-07 ENCOUNTER — Telehealth: Payer: Self-pay | Admitting: Internal Medicine

## 2017-03-07 NOTE — Telephone Encounter (Signed)
Done

## 2017-03-07 NOTE — Telephone Encounter (Signed)
Pt saw on his MyChart that he is over due for his tetanus shot and recalled he received this last year, I looked and he received this shot on 03/10/2016, can this be documented in his chart? Please advise

## 2017-03-14 ENCOUNTER — Telehealth: Payer: Self-pay | Admitting: Nurse Practitioner

## 2017-03-14 NOTE — Telephone Encounter (Signed)
Patient called back. I gave him Charlotte's response. He expressed understand and did not have any questions. He said that he will let us know how he is doing after increasing the dose.

## 2017-03-14 NOTE — Telephone Encounter (Signed)
Pt called and state he is still not sleeping. He would like  his dosage of zaleplon (SONATA) 10 MG capsule increased. Please advise and call back

## 2017-03-14 NOTE — Telephone Encounter (Signed)
Left vm for the pt to call back.  

## 2017-03-14 NOTE — Telephone Encounter (Signed)
Please advise 

## 2017-03-14 NOTE — Telephone Encounter (Signed)
He can take 2caps of his current prescription to see if that helps, before we send another RX. If no improvement with that he will need to f/up with Dr. Jenny Reichmann sooner than weeks.

## 2017-03-19 ENCOUNTER — Telehealth: Payer: Self-pay | Admitting: Nurse Practitioner

## 2017-03-19 DIAGNOSIS — G4700 Insomnia, unspecified: Secondary | ICD-10-CM

## 2017-03-19 MED ORDER — ZALEPLON 10 MG PO CAPS: 20.0000 mg | ORAL_CAPSULE | Freq: Every evening | ORAL | 0 refills | Status: DC | PRN

## 2017-03-19 NOTE — Telephone Encounter (Signed)
[facility] As requested.  Pt is aware.

## 2017-03-19 NOTE — Telephone Encounter (Signed)
Patient states Peter [name] told him to double up on sonata to 20mg .  States that this is working.  Would like to know if Peter [name] can send refill in for him.  [facility] [place]

## 2017-03-19 NOTE — Telephone Encounter (Signed)
Forwarding msg to charlotte for her response. pls advise on msg below...Peter [name]

## 2017-03-20 ENCOUNTER — Telehealth: Payer: Self-pay | Admitting: Internal Medicine

## 2017-03-20 DIAGNOSIS — G4700 Insomnia, unspecified: Secondary | ICD-10-CM

## 2017-03-20 MED ORDER — ZALEPLON 10 MG PO CAPS: 20.0000 mg | ORAL_CAPSULE | Freq: Every evening | ORAL | 2 refills | Status: DC | PRN

## 2017-03-20 NOTE — Telephone Encounter (Signed)
[facility] in and said [facility] for the zaleplon (SONATA) 10 MG capsule [[id number]]  He wanted to know if the new dose could be resent

## 2017-03-20 NOTE — Telephone Encounter (Signed)
Done hardcopy to [facility]  

## 2017-03-20 NOTE — Telephone Encounter (Signed)
Faxed

## 2017-04-10 ENCOUNTER — Other Ambulatory Visit: Payer: Self-pay | Admitting: Internal Medicine

## 2017-04-10 DIAGNOSIS — G4700 Insomnia, unspecified: Secondary | ICD-10-CM

## 2017-04-10 MED ORDER — ZALEPLON 10 MG PO CAPS: 20.0000 mg | ORAL_CAPSULE | Freq: Every evening | ORAL | 0 refills | Status: DC | PRN

## 2017-04-10 NOTE — Telephone Encounter (Signed)
Requested a refill on Sonata 20mg , 60 tablets when it is times for refill.

## 2017-04-10 NOTE — Telephone Encounter (Signed)
Forward to Lucy

## 2017-04-10 NOTE — Addendum Note (Signed)
Addended by: Biagio Borg on: 04/10/2017 03:58 PM   Modules accepted: Orders

## 2017-04-10 NOTE — Telephone Encounter (Signed)
See message below °

## 2017-04-10 NOTE — Telephone Encounter (Addendum)
Done erx  Please let pt know he will need ROV for further refills

## 2017-04-11 NOTE — Telephone Encounter (Signed)
Called pt no answer LMOM w/MD response../lmb 

## 2017-04-18 ENCOUNTER — Telehealth: Payer: Self-pay | Admitting: Internal Medicine

## 2017-04-18 NOTE — Telephone Encounter (Signed)
Copied from [place] [phone]. Topic: Quick Communication - See Telephone Encounter >> Apr 18, 2017  3:08 PM Cleaster Corin, NT wrote: CRM for notification. See Telephone encounter for:   04/18/17. Pt. Calling to see if he can get Doctors note sent to emailed or does he have to come pick up note (for migraines) lost the one he was previously has given by Dr. Baldo Ash, [place] pt. Stated that Dr. Cathlean Cower also knows about his headaches and was just seeing if he could get the note asap for his job. Please call pt. To let him know if this can be done.   [phone] [email or web address]

## 2017-04-19 NOTE — Telephone Encounter (Signed)
Options here would be a note as he suggests, but this is normally a note for work excuse for specific dates, usually at the time of office visit  Otherwise, he may be needing an FMLA, but this would be a form he obtains from his employer, and I would prefer an OV to go over the specifics and filling out the FMLA, so it can be done correctly the frst time

## 2017-04-19 NOTE — Telephone Encounter (Signed)
Called pt, LVM.  Per PCP, patient would need an OV for the requested note or possible FMLA (if needed).

## 2017-05-05 DIAGNOSIS — R0789 Other chest pain: Secondary | ICD-10-CM

## 2017-05-05 DIAGNOSIS — F419 Anxiety disorder, unspecified: Secondary | ICD-10-CM

## 2017-05-05 DIAGNOSIS — R0602 Shortness of breath: Secondary | ICD-10-CM

## 2017-05-06 DIAGNOSIS — R079 Chest pain, unspecified: Secondary | ICD-10-CM

## 2017-05-10 ENCOUNTER — Other Ambulatory Visit (INDEPENDENT_AMBULATORY_CARE_PROVIDER_SITE_OTHER): Payer: Worker's Compensation

## 2017-05-10 ENCOUNTER — Ambulatory Visit (INDEPENDENT_AMBULATORY_CARE_PROVIDER_SITE_OTHER): Payer: Self-pay | Admitting: Internal Medicine

## 2017-05-10 ENCOUNTER — Encounter: Payer: Self-pay | Admitting: Internal Medicine

## 2017-05-10 VITALS — BP 134/88 | HR 79 | Temp 97.5°F | Ht 68.0 in | Wt 184.0 lb

## 2017-05-10 DIAGNOSIS — F411 Generalized anxiety disorder: Secondary | ICD-10-CM

## 2017-05-10 DIAGNOSIS — G4700 Insomnia, unspecified: Secondary | ICD-10-CM

## 2017-05-10 DIAGNOSIS — F329 Major depressive disorder, single episode, unspecified: Secondary | ICD-10-CM

## 2017-05-10 DIAGNOSIS — E785 Hyperlipidemia, unspecified: Secondary | ICD-10-CM

## 2017-05-10 DIAGNOSIS — F32A Depression, unspecified: Secondary | ICD-10-CM | POA: Insufficient documentation

## 2017-05-10 LAB — LIPID PANEL
CHOL/HDL RATIO: 4
CHOLESTEROL: 226 mg/dL — AB (ref 0–200)
HDL: 52 mg/dL (ref >39.00)
LDL CALC: 152 mg/dL — AB (ref 0–99)
NonHDL: 173.76
TRIGLYCERIDES: 107 mg/dL (ref 0.0–149.0)
VLDL: 21.4 mg/dL (ref 0.0–40.0)

## 2017-05-10 MED ORDER — CLONAZEPAM 0.5 MG PO TABS
0.5000 mg | ORAL_TABLET | Freq: Two times a day (BID) | ORAL | 5 refills | Status: DC | PRN
Start: 2017-05-10 — End: 2017-11-13

## 2017-05-10 MED ORDER — TRAZODONE HCL 50 MG PO TABS: 25.0000 mg | ORAL_TABLET | Freq: Every evening | ORAL | 1 refills | Status: DC | PRN

## 2017-05-10 NOTE — Assessment & Plan Note (Signed)
Ok for klonopin bid prn,  to f/u any worsening symptoms or concerns  

## 2017-05-10 NOTE — Addendum Note (Signed)
Addended by: Juliet Rude on: 05/10/2017 03:06 PM   Modules accepted: Orders

## 2017-05-10 NOTE — Progress Notes (Signed)
Subjective:    Patient ID: Peter [name], male    DOB: 06/03/1955, 61 y.o.   MRN: [id number]  HPI  Here after recent hospn after weaned off of klonopin over 3 wks  and tried buspar but had shaky, chest pressure, nausea and numbness to torso and neck.  Was seen and tx at [facility] with neg stress test and what sounds like rule out MI; was told he should not take buspar anymore as this was likely an adverse side effects.  No benzo withdrawal.  Also with persistent difficulty sleeping as before and meds not really working well including the sonata and ambien not working well  Denies worsening reflux, abd pain, dysphagia, n/v, bowel change or blood and good compliance with PPI.  Denies worsening depressive symptoms, suicidal ideation, or panic; has recurrent migraine well controlled recently.  Not taking the lovastatin, not clear why. WIll be medicare eligible in sept 2019 per pt, due to the wait due to disability\ Past Medical History:  Diagnosis Date  . Anxiety state, unspecified 12/30/2012  . Arthritis   . Depression   . GERD (gastroesophageal reflux disease)   . Migraine   . Neck pain 11/22/2014   Past Surgical History:  Procedure Laterality Date  . right hip revision  2007  . right hip THA  2002  . TONSILLECTOMY    . 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 tumor removed      reports that  has never smoked. he has never used smokeless tobacco. He reports that he drinks alcohol. He reports that he uses drugs. Drug: Marijuana. family history includes Alcoholism in his unknown relative; Arthritis in his unknown relative; Colon polyps in his sister; Heart disease in his unknown relative; Hypertension in his unknown relative; Lung cancer in his unknown relative; Stroke in his unknown relative. Allergies  Allergen Reactions  . Lunesta [Eszopiclone]  Anaphylaxis and Other (See Comments)    Metal taste in mouth, lungs filled with fluid  . Demerol [Meperidine] Other (See Comments)    hallucinations  . Buspar [Buspirone] Other (See Comments)    Shaky, nausea   Current Outpatient Medications on File Prior to Visit  Medication Sig Dispense Refill  . Acetaminophen (TYLENOL 8 HOUR PO) Take by mouth.    Marland Kitchen aspirin 81 MG chewable tablet Chew 81 mg by mouth daily.    Marland Kitchen b complex vitamins tablet Take 1 tablet by mouth daily.    . Cyanocobalamin (B-12) 1000 MCG CAPS Take 1,000 mcg by mouth daily.    . Melatonin 10 MG TABS Take 10 mg by mouth at bedtime as needed (for sleep). Alternates with Ambien every other week    . Menthol, Topical Analgesic, (BIOFREEZE ROLL-ON) 4 % GEL Apply 1 application topically at bedtime as needed (back pain).    . Multiple Vitamin (MULTIVITAMIN WITH MINERALS) TABS tablet Take 1 tablet by mouth daily.    . naproxen (NAPROSYN) 500 MG tablet Take 1 tablet (500 mg total) by mouth 2 (two) times daily as needed for headache (for pain, take with food). 30 tablet 0  . Omega-3 Krill Oil 300 MG CAPS Take 300 mg by mouth daily.    Marland Kitchen omeprazole (PRILOSEC) 20 MG capsule Take 1 capsule (20 mg total) by mouth daily. 90 capsule 3   No current facility-administered medications on file prior to visit.  Review of Systems  Constitutional: Negative for other unusual diaphoresis or sweats HENT: Negative for ear discharge or swelling Eyes: Negative for other worsening visual disturbances Respiratory: Negative for stridor or other swelling  Gastrointestinal: Negative for worsening distension or other blood Genitourinary: Negative for retention or other urinary change Musculoskeletal: Negative for other MSK pain or swelling Skin: Negative for color change or other new lesions Neurological: Negative for worsening tremors and other numbness  Psychiatric/Behavioral: Negative for worsening agitation or other fatigue All other system neg per  pt    Objective:   Physical Exam  BP 134/88   Pulse 79   Temp (!) 97.5 F (36.4 C) (Oral)   Ht 5\' 8"  (1.727 m)   Wt 184 lb (83.5 kg)   SpO2 98%   BMI 27.98 kg/m  VS noted,  Constitutional: Pt appears in NAD HENT: Head: NCAT.  Right Ear: External ear normal.  Left Ear: External ear normal.  Eyes: . Pupils are equal, round, and reactive to light. Conjunctivae and EOM are normal Nose: without d/c or deformity Neck: Neck supple. Gross normal ROM Cardiovascular: Normal rate and regular rhythm.   Pulmonary/Chest: Effort normal and breath sounds without rales or wheezing.  Abd:  Soft, NT, ND, + BS, no organomegaly Neurological: Pt is alert. At baseline orientation, motor grossly intact Skin: Skin is warm. No rashes, other new lesions, no LE edema Psychiatric: Pt behavior is normal without agitation   Lab Results  Component Value Date   WBC 7.0 03/02/2017   HGB 14.3 03/02/2017   HCT 43.5 03/02/2017   PLT 296.0 03/02/2017   GLUCOSE 100 (H) 03/02/2017   CHOL 224 (H) 02/10/2016   TRIG 219.0 (H) 02/10/2016   HDL 53.20 02/10/2016   LDLDIRECT 146.0 02/10/2016   ALT 18 12/18/2014   AST 23 12/18/2014   NA 138 03/02/2017   K 4.9 03/02/2017   CL 100 03/02/2017   CREATININE 0.96 03/02/2017   BUN 18 03/02/2017   CO2 28 03/02/2017   TSH 3.18 03/02/2017   INR 0.96 02/17/2016   HGBA1C 6.0 03/02/2017       Assessment & Plan:

## 2017-05-10 NOTE — Assessment & Plan Note (Signed)
stable overall by history and exam, and pt to continue medical treatment as before,  to f/u any worsening symptoms or concerns 

## 2017-05-10 NOTE — Patient Instructions (Addendum)
Please take all new medication as prescribed  - the trazodone for sleep  Please continue all other medications as before, and refills have been done if requested - the klonopin  Please have the pharmacy call with any other refills you may need.  Please continue your efforts at being more active, low cholesterol diet, and weight control.  Please keep your appointments with your specialists as you may have planned  Please go to the LAB in the Basement (turn left off the elevator) for the tests to be done today  You will be contacted by phone if any changes need to be made immediately.  Otherwise, you will receive a letter about your results with an explanation, but please check with MyChart first.  Please remember to sign up for MyChart if you have not done so, as this will be important to you in the future with finding out test results, communicating by private email, and scheduling acute appointments online when needed.  Please return in 1 year for your yearly visit, or sooner if needed

## 2017-05-10 NOTE — Assessment & Plan Note (Addendum)
Pt requests lipid recheck, cont lower chol diet

## 2017-05-10 NOTE — Assessment & Plan Note (Signed)
Ok for change to trazadone qhs prn,  to f/u any worsening symptoms or concerns

## 2017-05-11 ENCOUNTER — Telehealth: Payer: Self-pay

## 2017-05-11 ENCOUNTER — Other Ambulatory Visit: Payer: Self-pay | Admitting: Internal Medicine

## 2017-05-11 MED ORDER — LOVASTATIN 40 MG PO TABS: 40.0000 mg | ORAL_TABLET | Freq: Every day | ORAL | 3 refills | Status: DC

## 2017-05-11 NOTE — Telephone Encounter (Signed)
Pt has been informed and expressed understanding.  

## 2017-05-11 NOTE — Telephone Encounter (Signed)
-----   Message from Biagio Borg, MD sent at 05/11/2017 12:41 PM EST ----- Left message on MyChart, pt to cont same tx except  The test results show that your current treatment is OK, except the LDL cholesterol is still moderately elevated, and not improved over the last year.  Please start Lovastatin 40 mg per day to help reduce heart disease and stroke in the future.  I will send the prescription, and you should hear from the office as well.    Shirron to please inform pt, I will do rx

## 2017-05-16 ENCOUNTER — Other Ambulatory Visit: Payer: Self-pay | Admitting: Internal Medicine

## 2017-05-16 MED ORDER — LOVASTATIN 20 MG PO TABS: 40.0000 mg | ORAL_TABLET | Freq: Every day | ORAL | 3 refills | Status: DC

## 2017-05-21 ENCOUNTER — Encounter: Payer: Self-pay | Admitting: Internal Medicine

## 2017-05-21 MED ORDER — LOVASTATIN 20 MG PO TABS: 40.0000 mg | ORAL_TABLET | Freq: Every day | ORAL | 3 refills | Status: DC

## 2017-05-25 ENCOUNTER — Telehealth: Payer: Self-pay | Admitting: Internal Medicine

## 2017-05-25 MED ORDER — TRAZODONE HCL 50 MG PO TABS: ORAL_TABLET | ORAL | 1 refills | Status: DC

## 2017-05-25 NOTE — Telephone Encounter (Signed)
See below

## 2017-05-25 NOTE — Telephone Encounter (Signed)
Copied from [place]. Topic: Quick Communication - See Telephone Encounter >> May 25, 2017 11:54 AM Bea Graff, NT wrote: CRM for notification. See Telephone encounter for: Pt would like to see if he can have a refill and a mg change for his traZODone (DESYREL). He began to double the medicine and the 100mg  has helped so much. [facility] [place]   05/25/17.

## 2017-05-25 NOTE — Telephone Encounter (Signed)
Pt requesting Trazodone refill and asking if his dosage could be changed as well.

## 2017-05-25 NOTE — Telephone Encounter (Signed)
[place] for increased trazodone - done erx

## 2017-06-18 ENCOUNTER — Encounter: Payer: Self-pay | Admitting: Internal Medicine

## 2017-06-19 MED ORDER — TRAZODONE HCL 150 MG PO TABS: 150.0000 mg | ORAL_TABLET | Freq: Every evening | ORAL | 1 refills | Status: DC | PRN

## 2017-11-12 ENCOUNTER — Telehealth: Payer: Self-pay | Admitting: Internal Medicine

## 2017-11-12 NOTE — Telephone Encounter (Signed)
Copied from [place] [phone]. Topic: Quick Communication - Rx Refill/Question >> Nov 12, 2017  1:37 PM Oliver Pila B wrote: Medication: clonazePAM (KLONOPIN) 0.5 MG tablet [[id number]]   Pt called and states that the medication above will need a PA done for refill by the 4th of this month, contact pt if needed

## 2017-11-13 MED ORDER — CLONAZEPAM 0.5 MG PO TABS: 0.5000 mg | ORAL_TABLET | Freq: Two times a day (BID) | ORAL | 5 refills | Status: DC | PRN

## 2017-11-13 NOTE — Telephone Encounter (Signed)
Done erx  shirron to note need for PA please

## 2017-11-13 NOTE — Addendum Note (Signed)
Addended by: Biagio Borg on: 11/13/2017 12:27 PM   Modules accepted: Orders

## 2017-11-13 NOTE — Telephone Encounter (Signed)
LOV 05/10/17 Dr. Jenny Reichmann Last refill 05/10/17  # [phone] with 5 refill Asking for prior authorization

## 2017-11-28 ENCOUNTER — Encounter: Payer: Self-pay | Admitting: Internal Medicine

## 2018-03-05 DIAGNOSIS — R69 Illness, unspecified: Secondary | ICD-10-CM | POA: Diagnosis not present

## 2018-03-12 DIAGNOSIS — R69 Illness, unspecified: Secondary | ICD-10-CM | POA: Diagnosis not present

## 2018-04-04 DIAGNOSIS — L578 Other skin changes due to chronic exposure to nonionizing radiation: Secondary | ICD-10-CM | POA: Diagnosis not present

## 2018-04-04 DIAGNOSIS — D485 Neoplasm of uncertain behavior of skin: Secondary | ICD-10-CM | POA: Diagnosis not present

## 2018-04-04 DIAGNOSIS — L570 Actinic keratosis: Secondary | ICD-10-CM | POA: Diagnosis not present

## 2018-04-04 DIAGNOSIS — D1801 Hemangioma of skin and subcutaneous tissue: Secondary | ICD-10-CM | POA: Diagnosis not present

## 2018-04-04 DIAGNOSIS — D225 Melanocytic nevi of trunk: Secondary | ICD-10-CM | POA: Diagnosis not present

## 2018-04-05 ENCOUNTER — Encounter: Payer: Self-pay | Admitting: Internal Medicine

## 2018-04-05 MED ORDER — TRAZODONE HCL 50 MG PO TABS: 25.0000 mg | ORAL_TABLET | Freq: Every evening | ORAL | 1 refills | Status: DC | PRN

## 2018-04-09 ENCOUNTER — Telehealth: Payer: Self-pay | Admitting: Internal Medicine

## 2018-04-09 MED ORDER — OMEPRAZOLE 40 MG PO CPDR: 40.0000 mg | DELAYED_RELEASE_CAPSULE | Freq: Every day | ORAL | 3 refills | Status: DC

## 2018-04-09 NOTE — Addendum Note (Signed)
Addended by: Biagio Borg on: 04/09/2018 05:24 PM   Modules accepted: Orders

## 2018-04-09 NOTE — Telephone Encounter (Signed)
[facility] 

## 2018-04-09 NOTE — Telephone Encounter (Signed)
Copied from [place] [phone]. Topic: Quick Communication - Rx Refill/Question >> Apr 09, 2018  1:49 PM Margot Ables wrote: Medication: omeprazole (PRILOSEC) 20 MG capsule - pt was getting OTC due to insurance copays being higher than OTC - pt now has [facility] replacement plan and would like to have RX omeprazole again - pt is scheduled for next available OV 04/22/18 as well - can 30 day supply be sent in for him? Please advise pt.  Pt also said that he would like to re-evaluate cholesterol medication some time in the spring if possible  Has the patient contacted their pharmacy? No -was getting OTC Preferred Pharmacy (with phone number or street name): [id number] - [place], [place]. [phone] (Phone) [phone] (Fax)

## 2018-04-22 ENCOUNTER — Ambulatory Visit: Payer: Self-pay | Admitting: Internal Medicine

## 2018-05-16 ENCOUNTER — Other Ambulatory Visit (INDEPENDENT_AMBULATORY_CARE_PROVIDER_SITE_OTHER): Payer: Medicare HMO

## 2018-05-16 ENCOUNTER — Telehealth: Payer: Self-pay | Admitting: Internal Medicine

## 2018-05-16 ENCOUNTER — Encounter: Payer: Self-pay | Admitting: Internal Medicine

## 2018-05-16 ENCOUNTER — Other Ambulatory Visit: Payer: Self-pay | Admitting: Internal Medicine

## 2018-05-16 ENCOUNTER — Ambulatory Visit (INDEPENDENT_AMBULATORY_CARE_PROVIDER_SITE_OTHER): Payer: Medicare HMO | Admitting: Internal Medicine

## 2018-05-16 VITALS — BP 154/100 | HR 90 | Temp 98.0°F | Ht 68.0 in | Wt 183.0 lb

## 2018-05-16 DIAGNOSIS — Z0000 Encounter for general adult medical examination without abnormal findings: Secondary | ICD-10-CM | POA: Diagnosis not present

## 2018-05-16 DIAGNOSIS — R69 Illness, unspecified: Secondary | ICD-10-CM | POA: Diagnosis not present

## 2018-05-16 DIAGNOSIS — Z114 Encounter for screening for human immunodeficiency virus [HIV]: Secondary | ICD-10-CM | POA: Diagnosis not present

## 2018-05-16 DIAGNOSIS — G4700 Insomnia, unspecified: Secondary | ICD-10-CM | POA: Diagnosis not present

## 2018-05-16 LAB — BASIC METABOLIC PANEL
BUN: 19 mg/dL (ref 6–23)
CO2: 27 mEq/L (ref 19–32)
Calcium: 9.8 mg/dL (ref 8.4–10.5)
Chloride: 103 mEq/L (ref 96–112)
Creatinine, Ser: 0.99 mg/dL (ref 0.40–1.50)
GFR: 81.62 mL/min (ref >60.00)
GLUCOSE: 93 mg/dL (ref 70–99)
Potassium: 4.3 mEq/L (ref 3.5–5.1)
SODIUM: 139 meq/L (ref 135–145)

## 2018-05-16 LAB — CBC WITH DIFFERENTIAL/PLATELET
Basophils Absolute: 0 10*3/uL (ref 0.0–0.1)
Basophils Relative: 0.5 % (ref 0.0–3.0)
Eosinophils Absolute: 0.1 10*3/uL (ref 0.0–0.7)
Eosinophils Relative: 0.9 % (ref 0.0–5.0)
HCT: 46 % (ref 39.0–52.0)
Hemoglobin: 15.5 g/dL (ref 13.0–17.0)
Lymphocytes Relative: 23.4 % (ref 12.0–46.0)
Lymphs Abs: 1.5 10*3/uL (ref 0.7–4.0)
MCHC: 33.7 g/dL (ref 30.0–36.0)
MCV: 91.2 fl (ref 78.0–100.0)
Monocytes Absolute: 0.7 10*3/uL (ref 0.1–1.0)
Monocytes Relative: 11.3 % (ref 3.0–12.0)
NEUTROS ABS: 4.1 10*3/uL (ref 1.4–7.7)
Neutrophils Relative %: 63.9 % (ref 43.0–77.0)
Platelets: 265 10*3/uL (ref 150.0–400.0)
RBC: 5.04 Mil/uL (ref 4.22–5.81)
RDW: 16.3 % — ABNORMAL HIGH (ref 11.5–15.5)
WBC: 6.4 10*3/uL (ref 4.0–10.5)

## 2018-05-16 LAB — HEPATIC FUNCTION PANEL
ALBUMIN: 4.9 g/dL (ref 3.5–5.2)
ALT: 18 U/L (ref 0–53)
AST: 18 U/L (ref 0–37)
Alkaline Phosphatase: 69 U/L (ref 39–117)
Bilirubin, Direct: 0.1 mg/dL (ref 0.0–0.3)
Total Bilirubin: 0.4 mg/dL (ref 0.2–1.2)
Total Protein: 7.1 g/dL (ref 6.0–8.3)

## 2018-05-16 LAB — URINALYSIS, ROUTINE W REFLEX MICROSCOPIC
Bilirubin Urine: NEGATIVE
Hgb urine dipstick: NEGATIVE
Ketones, ur: 15 — AB
Leukocytes, UA: NEGATIVE
Nitrite: NEGATIVE
Specific Gravity, Urine: >=1.03 — AB (ref 1.000–1.030)
Urine Glucose: NEGATIVE
Urobilinogen, UA: 0.2 (ref 0.0–1.0)
pH: 5.5 (ref 5.0–8.0)

## 2018-05-16 LAB — LIPID PANEL
Cholesterol: 258 mg/dL — ABNORMAL HIGH (ref 0–200)
HDL: 54.1 mg/dL (ref >39.00)
LDL Cholesterol: 167 mg/dL — ABNORMAL HIGH (ref 0–99)
NONHDL: 203.56
Total CHOL/HDL Ratio: 5
Triglycerides: 182 mg/dL — ABNORMAL HIGH (ref 0.0–149.0)
VLDL: 36.4 mg/dL (ref 0.0–40.0)

## 2018-05-16 LAB — TSH: TSH: 2.64 u[IU]/mL (ref 0.35–4.50)

## 2018-05-16 LAB — PSA: PSA: 0.54 ng/mL (ref 0.10–4.00)

## 2018-05-16 MED ORDER — ROSUVASTATIN CALCIUM 20 MG PO TABS: 20.0000 mg | ORAL_TABLET | Freq: Every day | ORAL | 3 refills | Status: DC

## 2018-05-16 MED ORDER — ZOLPIDEM TARTRATE ER 12.5 MG PO TBCR: 12.5000 mg | EXTENDED_RELEASE_TABLET | Freq: Every evening | ORAL | 1 refills | Status: DC | PRN

## 2018-05-16 MED ORDER — CLONAZEPAM 0.5 MG PO TABS: 0.5000 mg | ORAL_TABLET | Freq: Two times a day (BID) | ORAL | 5 refills | Status: DC | PRN

## 2018-05-16 NOTE — Progress Notes (Signed)
Subjective:    Patient ID: Peter [name], male    DOB: [birth year], 63 y.o.   MRN: [id number]  HPI  Here for wellness and f/u;  Overall doing ok;  Pt denies Chest pain, worsening SOB, DOE, wheezing, orthopnea, PND, worsening LE edema, palpitations, dizziness or syncope.  Pt denies neurological change such as new headache, facial or extremity weakness.  Pt denies polydipsia, polyuria, or low sugar symptoms. Pt states overall good compliance with treatment and medications, good tolerability, and has been trying to follow appropriate diet.  Pt denies worsening depressive symptoms, suicidal ideation or panic. No fever, night sweats, wt loss, loss of appetite, or other constitutional symptoms.  Pt states good ability with ADL's, has low fall risk, home safety reviewed and adequate, no other significant changes in hearing or vision, and only occasionally active with exercise. BP at home < 140/90, does not want change today.   BP Readings from Last 3 Encounters:  05/16/18 (!) 154/100  05/10/17 134/88  03/02/17 128/82   Wt Readings from Last 3 Encounters:  05/16/18 138 lb (62.6 kg)  05/10/17 184 lb (83.5 kg)  03/02/17 178 lb (80.7 kg)  Worked part time at [employer].   Stopped lovastatin 6 mo ago as he was eating more, gaining wt.  Not sure if he wants to restart.  Still having some difficulty with sleep, trazodone seemed to help to start but later "felt bad". Asks for going back to [place].   Past Medical History:  Diagnosis Date  . Anxiety state, unspecified 12/30/2012  . Arthritis   . Depression   . GERD (gastroesophageal reflux disease)   . Migraine   . Neck pain 11/22/2014   Past Surgical History:  Procedure Laterality Date  . right hip revision  2007  . right hip THA  2002  . TONSILLECTOMY    . 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 tumor  removed      reports that he has never smoked. He has never used smokeless tobacco. He reports current alcohol use. He reports current drug use. Drug: Marijuana. family history includes Alcoholism in his unknown relative; Arthritis in his unknown relative; Colon polyps in his sister; Heart disease in his unknown relative; Hypertension in his unknown relative; Lung cancer in his unknown relative; Stroke in his unknown relative. Allergies  Allergen Reactions  . Lunesta [Eszopiclone] Anaphylaxis and Other (See Comments)    Metal taste in mouth, lungs filled with fluid  . Demerol [Meperidine] Other (See Comments)    hallucinations  . Buspar [Buspirone] Other (See Comments)    Shaky, nausea   Current Outpatient Medications on File Prior to Visit  Medication Sig Dispense Refill  . Acetaminophen (TYLENOL 8 HOUR PO) Take by mouth.    Marland Kitchen aspirin 81 MG chewable tablet Chew 81 mg by mouth daily.    Marland Kitchen b complex vitamins tablet Take 1 tablet by mouth daily.    . Cyanocobalamin (B-12) 1000 MCG CAPS Take 1,000 mcg by mouth daily.    . Menthol, Topical Analgesic, (BIOFREEZE ROLL-ON) 4 % GEL Apply 1 application topically at bedtime as needed (back pain).    . Multiple Vitamin (MULTIVITAMIN WITH MINERALS) TABS tablet Take 1 tablet by mouth daily.    . Omega-3 Krill Oil 300 MG CAPS Take 300 mg by mouth daily.    Marland Kitchen omeprazole (PRILOSEC) 40  MG capsule Take 1 capsule (40 mg total) by mouth daily. 90 capsule 3   No current facility-administered medications on file prior to visit.     Review of Systems Constitutional: Negative for other unusual diaphoresis, sweats, appetite or weight changes HENT: Negative for other worsening hearing loss, ear pain, facial swelling, mouth sores or neck stiffness.   Eyes: Negative for other worsening pain, redness or other visual disturbance.  Respiratory: Negative for other stridor or swelling Cardiovascular: Negative for other palpitations or other chest pain    Gastrointestinal: Negative for worsening diarrhea or loose stools, blood in stool, distention or other pain Genitourinary: Negative for hematuria, flank pain or other change in urine volume.  Musculoskeletal: Negative for myalgias or other joint swelling.  Skin: Negative for other color change, or other wound or worsening drainage.  Neurological: Negative for other syncope or numbness. Hematological: Negative for other adenopathy or swelling Psychiatric/Behavioral: Negative for hallucinations, other worsening agitation, SI, self-injury, or new decreased concentration All other system neg per pt    Objective:   Physical Exam BP (!) 154/100   Pulse 90   Temp 98 F (36.7 C) (Oral)   Ht 5\' 8"  (1.727 m)   Wt 138 lb (62.6 kg)   SpO2 96%   BMI 20.98 kg/m  VS noted,  Constitutional: Pt is oriented to person, place, and time. Appears well-developed and well-nourished, in no significant distress and comfortable Head: Normocephalic and atraumatic  Eyes: Conjunctivae and EOM are normal. Pupils are equal, round, and reactive to light Right Ear: External ear normal without discharge Left Ear: External ear normal without discharge Nose: Nose without discharge or deformity Mouth/Throat: Oropharynx is without other ulcerations and moist  Neck: Normal range of motion. Neck supple. No JVD present. No tracheal deviation present or significant neck LA or mass Cardiovascular: Normal rate, regular rhythm, normal heart sounds and intact distal pulses.   Pulmonary/Chest: WOB normal and breath sounds without rales or wheezing  Abdominal: Soft. Bowel sounds are normal. NT. No HSM  Musculoskeletal: Normal range of motion. Exhibits no edema Lymphadenopathy: Has no other cervical adenopathy.  Neurological: Pt is alert and oriented to person, place, and time. Pt has normal reflexes. No cranial nerve deficit. Motor grossly intact, Gait intact Skin: Skin is warm and dry. No rash noted or new  ulcerations Psychiatric:  Has normal mood and affect. Behavior is normal without agitation No other exam findings Lab Results  Component Value Date   WBC 6.4 05/16/2018   HGB 15.5 05/16/2018   HCT 46.0 05/16/2018   PLT 265.0 05/16/2018   GLUCOSE 93 05/16/2018   CHOL 258 (H) 05/16/2018   TRIG 182.0 (H) 05/16/2018   HDL 54.10 05/16/2018   LDLDIRECT 146.0 02/10/2016   LDLCALC 167 (H) 05/16/2018   ALT 18 05/16/2018   AST 18 05/16/2018   NA 139 05/16/2018   K 4.3 05/16/2018   CL 103 05/16/2018   CREATININE 0.99 05/16/2018   BUN 19 05/16/2018   CO2 27 05/16/2018   TSH 2.64 05/16/2018   PSA 0.54 05/16/2018   INR 0.96 02/17/2016   HGBA1C 6.0 03/02/2017       Assessment & Plan:

## 2018-05-16 NOTE — Assessment & Plan Note (Signed)

## 2018-05-16 NOTE — Telephone Encounter (Signed)
Prescription for zolpidem (AMBIEN CR) 12.5 MG CR tablet is requiring a prior authorization. Can this be started for the patient please?

## 2018-05-16 NOTE — Assessment & Plan Note (Signed)
Ok for change to [facility] qhs prn

## 2018-05-16 NOTE — Patient Instructions (Signed)
OK to change the trazodone to the Ambien  Please continue all other medications as before, and refills have been done if requested.  Please have the pharmacy call with any other refills you may need.  Please continue your efforts at being more active, low cholesterol diet, and weight control.  You are otherwise up to date with prevention measures today.  Please keep your appointments with your specialists as you may have planned  Please go to the LAB in the Basement (turn left off the elevator) for the tests to be done today  You will be contacted by phone if any changes need to be made immediately.  Otherwise, you will receive a letter about your results with an explanation, but please check with MyChart first.  Please remember to sign up for MyChart if you have not done so, as this will be important to you in the future with finding out test results, communicating by private email, and scheduling acute appointments online when needed.  Please return in 1 year for your yearly visit, or sooner if needed, with Lab testing done 3-5 days before

## 2018-05-17 ENCOUNTER — Telehealth: Payer: Self-pay

## 2018-05-17 MED ORDER — ZOLPIDEM TARTRATE 10 MG PO TABS: 10.0000 mg | ORAL_TABLET | Freq: Every evening | ORAL | 1 refills | Status: DC | PRN

## 2018-05-17 NOTE — Telephone Encounter (Signed)
Per pharmacy   Alternative Requested:THE AMBIEN CR ISN'T ON INSURANCE FORMULARY, THEY PREFER TEH PLAIN AMBIEN OR ZALEPLON. THANKS FOR THE CONSIDERATION

## 2018-05-17 NOTE — Telephone Encounter (Signed)
Received msg from pt's pharmacy with preferred alternatives. PCP has reviewed and sent in the plain zolpidem as requested. Please review previous telephone encounter.

## 2018-05-17 NOTE — Telephone Encounter (Signed)
Pt has viewed results via MyChart  

## 2018-05-17 NOTE — Telephone Encounter (Signed)
-----   Message from Biagio Borg, MD sent at 05/16/2018  5:00 PM EST ----- Left message on MyChart, pt to cont same tx except  The test results show that your current treatment is OK, except the LDL cholesterol is quite high again.  Please ty a different statin called crestor 20 mg per day, as even though one statin may cause trouble, another statin often does not.  I will send the prescription and you should hear from the office as well.    Shirron to please inform pt, I will do rx

## 2018-05-17 NOTE — Addendum Note (Signed)
Addended by: Biagio Borg on: 05/17/2018 01:25 PM   Modules accepted: Orders

## 2018-05-17 NOTE — Telephone Encounter (Signed)
Ok, done erx 

## 2018-05-22 ENCOUNTER — Encounter: Payer: Self-pay | Admitting: Internal Medicine

## 2018-05-22 DIAGNOSIS — R079 Chest pain, unspecified: Secondary | ICD-10-CM

## 2018-05-23 ENCOUNTER — Encounter: Payer: Self-pay | Admitting: Internal Medicine

## 2018-06-03 NOTE — Progress Notes (Deleted)
[name] [address] [address] [place], [place] [address] Phone: [phone] Subjective:    I'm seeing this patient by the request  of:    CC:   [id number]  Peter [name] is a 63 y.o. male coming in with complaint of ***  Onset-  Location Duration-  Character- Aggravating factors- Reliving factors-  Therapies tried-  Severity-     Past Medical History:  Diagnosis Date  . Anxiety state, unspecified 12/30/2012  . Arthritis   . Depression   . GERD (gastroesophageal reflux disease)   . Migraine   . Neck pain 11/22/2014   Past Surgical History:  Procedure Laterality Date  . right hip revision  2007  . right hip THA  2002  . TONSILLECTOMY    . 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 tumor removed     Social History   Socioeconomic History  . Marital status: Single    Spouse name: Not on file  . Number of children: Not on file  . Years of education: Not on file  . Highest education level: Not on file  Occupational History  . Not on file  Social Needs  . Financial resource strain: Not on file  . Food insecurity:    Worry: Not on file    Inability: Not on file  . Transportation needs:    Medical: Not on file    Non-medical: Not on file  Tobacco Use  . Smoking status: Never Smoker  . Smokeless tobacco: Never Used  Substance and Sexual Activity  . Alcohol use: Yes    Comment: occ wine  . Drug use: Yes    Types: Marijuana    Comment: last usage 1 week ago  . Sexual activity: Not on file  Lifestyle  . Physical activity:    Days per week: Not on file    Minutes per session: Not on file  . Stress: Not on file  Relationships  . Social connections:    Talks on phone: Not on file    Gets together: Not on file    Attends religious service: Not on file    Active member of club or  organization: Not on file    Attends meetings of clubs or organizations: Not on file    Relationship status: Not on file  Other Topics Concern  . Not on file  Social History Narrative  . Not on file   Allergies  Allergen Reactions  . Lunesta [Eszopiclone] Anaphylaxis and Other (See Comments)    Metal taste in mouth, lungs filled with fluid  . Demerol [Meperidine] Other (See Comments)    hallucinations  . Buspar [Buspirone] Other (See Comments)    Shaky, nausea   Family History  Problem Relation Age of Onset  . Alcoholism Unknown   . Arthritis Unknown   . Lung cancer Unknown   . Heart disease Unknown   . Stroke Unknown   . Hypertension Unknown   . Colon polyps Sister   . Colon cancer Neg Hx      Current Outpatient Medications (Cardiovascular):  .  rosuvastatin (CRESTOR) 20 MG tablet, Take 1 tablet (20 mg total) by mouth daily.   Current Outpatient Medications (Analgesics):  Marland Kitchen  Acetaminophen (TYLENOL 8 HOUR PO), Take by mouth. Marland Kitchen  aspirin 81 MG chewable tablet, Chew 81 mg by mouth daily.  Current Outpatient Medications (Hematological):  Marland Kitchen  Cyanocobalamin (B-12) 1000 MCG CAPS, Take 1,000 mcg by mouth daily.  Current Outpatient Medications (Other):  .  b complex vitamins tablet, Take 1 tablet by mouth daily. .  clonazePAM (KLONOPIN) 0.5 MG tablet, Take 1 tablet (0.5 mg total) by mouth 2 (two) times daily as needed. .  Menthol, Topical Analgesic, (BIOFREEZE ROLL-ON) 4 % GEL, Apply 1 application topically at bedtime as needed (back pain). .  Multiple Vitamin (MULTIVITAMIN WITH MINERALS) TABS tablet, Take 1 tablet by mouth daily. .  Omega-3 Krill Oil 300 MG CAPS, Take 300 mg by mouth daily. Marland Kitchen  omeprazole (PRILOSEC) 40 MG capsule, Take 1 capsule (40 mg total) by mouth daily. Marland Kitchen  zolpidem (AMBIEN) 10 MG tablet, Take 1 tablet (10 mg total) by mouth at bedtime as needed for sleep.    Past medical history, social, surgical and family history all reviewed in electronic medical  record.  No pertanent information unless stated regarding to the chief complaint.   Review of Systems:  No headache, visual changes, nausea, vomiting, diarrhea, constipation, dizziness, abdominal pain, skin rash, fevers, chills, night sweats, weight loss, swollen lymph nodes, body aches, joint swelling, muscle aches, chest pain, shortness of breath, mood changes.   Objective  There were no vitals taken for this visit. Systems examined below as of    General: No apparent distress alert and oriented x3 mood and affect normal, dressed appropriately.  HEENT: Pupils equal, extraocular movements intact  Respiratory: Patient's speak in full sentences and does not appear short of breath  Cardiovascular: No lower extremity edema, non tender, no erythema  Skin: Warm dry intact with no signs of infection or rash on extremities or on axial skeleton.  Abdomen: Soft nontender  Neuro: Cranial nerves II through XII are intact, neurovascularly intact in all extremities with 2+ DTRs and 2+ pulses.  Lymph: No lymphadenopathy of posterior or anterior cervical chain or axillae bilaterally.  Gait normal with good balance and coordination.  MSK:  Non tender with full range of motion and good stability and symmetric strength and tone of shoulders, elbows, wrist, hip, knee and ankles bilaterally.     Impression and Recommendations:     This case required medical decision making of moderate complexity. The above documentation has been reviewed and is accurate and complete Lyndal Pulley, DO       Note: This dictation was prepared with Dragon dictation along with smaller phrase technology. Any transcriptional errors that result from this process are unintentional.

## 2018-06-04 ENCOUNTER — Ambulatory Visit: Payer: Medicare HMO | Admitting: Family Medicine

## 2018-06-14 ENCOUNTER — Ambulatory Visit: Payer: Medicare HMO | Admitting: Family Medicine

## 2018-06-16 NOTE — Progress Notes (Signed)
[name] [address] [place], [place] [address] Phone: [phone] Subjective:   [name], am serving as a scribe for Dr. Hulan Saas.   CC: Right wrist pain  [id number]    09/24/2013: The patient does have a generalized anxiety disorder that I think his not being treated quite sufficiently. I do believe the patient does need a maintenance medication. Patient's other side effects from the medications I think SNRI would be helpful. Patient was started on Effexor and will do abridging with Clonopin only over the course of the next week. Patient will use hydroxyzine for breakthrough. 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 Patient is going to try these medications he'll call if he has any side effects. Otherwise he will followup with primary care provider in 2 weeks.  Update 06/17/2018:  Peter [name] is a 63 y.o. male coming in with complaint of right wrist pain. Patient states that his pain has been going on for 6 months. Is active riding his bike and working out. Feels sharp pain over the dorsal part of wrist. Pain with wrist extension.     Past Medical History:  Diagnosis Date  . Anxiety state, unspecified 12/30/2012  . Arthritis   . Depression   . GERD (gastroesophageal reflux disease)   . Migraine   . Neck pain 11/22/2014   Past Surgical History:  Procedure Laterality Date  . right hip revision  2007  . right hip THA  2002  . TONSILLECTOMY    . 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 tumor removed     Social History   Socioeconomic History  . Marital status: Single    Spouse name: Not on file  . Number of children: Not on file  . Years of education: Not on file  . Highest education level: Not on file  Occupational History  . Not on file  Social Needs    . Financial resource strain: Not on file  . Food insecurity:    Worry: Not on file    Inability: Not on file  . Transportation needs:    Medical: Not on file    Non-medical: Not on file  Tobacco Use  . Smoking status: Never Smoker  . Smokeless tobacco: Never Used  Substance and Sexual Activity  . Alcohol use: Yes    Comment: occ wine  . Drug use: Yes    Types: Marijuana    Comment: last usage 1 week ago  . Sexual activity: Not on file  Lifestyle  . Physical activity:    Days per week: Not on file    Minutes per session: Not on file  . Stress: Not on file  Relationships  . Social connections:    Talks on phone: Not on file    Gets together: Not on file    Attends religious service: Not on file    Active member of club or organization: Not on file    Attends meetings of clubs or organizations: Not on file    Relationship status: Not on file  Other Topics Concern  . Not on file  Social History Narrative  . Not on file   Allergies  Allergen Reactions  . Lunesta [Eszopiclone] Anaphylaxis and Other (See Comments)    Metal taste in mouth, lungs filled with fluid  .  Demerol [Meperidine] Other (See Comments)    hallucinations  . Buspar [Buspirone] Other (See Comments)    Shaky, nausea   Family History  Problem Relation Age of Onset  . Alcoholism Unknown   . Arthritis Unknown   . Lung cancer Unknown   . Heart disease Unknown   . Stroke Unknown   . Hypertension Unknown   . Colon polyps Sister   . Colon cancer Neg Hx      Current Outpatient Medications (Cardiovascular):  .  rosuvastatin (CRESTOR) 20 MG tablet, Take 1 tablet (20 mg total) by mouth daily.   Current Outpatient Medications (Analgesics):  Marland Kitchen  Acetaminophen (TYLENOL 8 HOUR PO), Take by mouth. Marland Kitchen  aspirin 81 MG chewable tablet, Chew 81 mg by mouth daily.  Current Outpatient Medications (Hematological):  Marland Kitchen  Cyanocobalamin (B-12) 1000 MCG CAPS, Take 1,000 mcg by mouth daily.  Current Outpatient  Medications (Other):  .  b complex vitamins tablet, Take 1 tablet by mouth daily. .  clonazePAM (KLONOPIN) 0.5 MG tablet, Take 1 tablet (0.5 mg total) by mouth 2 (two) times daily as needed. .  Menthol, Topical Analgesic, (BIOFREEZE ROLL-ON) 4 % GEL, Apply 1 application topically at bedtime as needed (back pain). .  Multiple Vitamin (MULTIVITAMIN WITH MINERALS) TABS tablet, Take 1 tablet by mouth daily. .  Omega-3 Krill Oil 300 MG CAPS, Take 300 mg by mouth daily. Marland Kitchen  omeprazole (PRILOSEC) 40 MG capsule, Take 1 capsule (40 mg total) by mouth daily. Marland Kitchen  zolpidem (AMBIEN) 10 MG tablet, Take 1 tablet (10 mg total) by mouth at bedtime as needed for sleep.    Past medical history, social, surgical and family history all reviewed in electronic medical record.  No pertanent information unless stated regarding to the chief complaint.   Review of Systems:  No headache, visual changes, nausea, vomiting, diarrhea, constipation, dizziness, abdominal pain, skin rash, fevers, chills, night sweats, weight loss, swollen lymph nodes, body aches, joint swelling, , chest pain, shortness of breath, mood changes.  Mild positive muscle aches  Objective  Blood pressure 112/90, pulse (!) 107, height 5\' 8"  (1.727 m), weight 183 lb (83 kg), SpO2 96 %.    General: No apparent distress alert and oriented x3 mood and affect normal, dressed appropriately.  HEENT: Pupils equal, extraocular movements intact  Respiratory: Patient's speak in full sentences and does not appear short of breath  Cardiovascular: No lower extremity edema, non tender, no erythema  Skin: Warm dry intact with no signs of infection or rash on extremities or on axial skeleton.  Abdomen: Soft nontender  Neuro: Cranial nerves II through XII are intact, neurovascularly intact in all extremities with 2+ DTRs and 2+ pulses.  Lymph: No lymphadenopathy of posterior or anterior cervical chain or axillae bilaterally.  Gait normal with good balance and  coordination.  MSK:  Non tender with full range of motion and good stability and symmetric strength and tone of shoulders, elbows,  hip, knee and ankles bilaterally.  Wrist: Right Inspection normal with no visible erythema or swelling. ROM smooth and normal with good flexion and extension and ulnar/radial deviation that is symmetrical with opposite wrist. Pain over the TFCC as well as over the lunate bone dorsally No snuffbox tenderness. No tenderness over Canal of [place]. Strength 5/5 in all directions without pain. Negative Finkelstein, tinel's and phalens. Mild positive Watson's test. Contralateral wrist unremarkable  MSK US performed of: Right wrist This study was ordered, performed, and interpreted by Charlann Boxer D.O.  Wrist: All  extensor compartments visualized and tendons mild hypoechoic changes within the tendon sheath noted mostly in the third compartment TFCC does have a tear noted.Marland Kitchen  IMPRESSION: TFCC noted with mild extensor tendon tendinitis   Impression and Recommendations:     This case required medical decision making of moderate complexity. The above documentation has been reviewed and is accurate and complete Lyndal Pulley, DO       Note: This dictation was prepared with Dragon dictation along with smaller phrase technology. Any transcriptional errors that result from this process are unintentional.

## 2018-06-17 ENCOUNTER — Other Ambulatory Visit: Payer: Medicare HMO

## 2018-06-17 ENCOUNTER — Ambulatory Visit: Payer: Self-pay

## 2018-06-17 ENCOUNTER — Ambulatory Visit (INDEPENDENT_AMBULATORY_CARE_PROVIDER_SITE_OTHER): Payer: Medicare HMO | Admitting: Family Medicine

## 2018-06-17 ENCOUNTER — Encounter: Payer: Self-pay | Admitting: Family Medicine

## 2018-06-17 VITALS — BP 112/90 | HR 107 | Ht 68.0 in | Wt 183.0 lb

## 2018-06-17 DIAGNOSIS — M25531 Pain in right wrist: Secondary | ICD-10-CM

## 2018-06-17 DIAGNOSIS — M24131 Other articular cartilage disorders, right wrist: Secondary | ICD-10-CM | POA: Diagnosis not present

## 2018-06-17 DIAGNOSIS — M25331 Other instability, right wrist: Secondary | ICD-10-CM | POA: Insufficient documentation

## 2018-06-17 DIAGNOSIS — M2550 Pain in unspecified joint: Secondary | ICD-10-CM

## 2018-06-17 NOTE — Assessment & Plan Note (Signed)
Muscle strain.  Likely secondary to more of a previous injury.  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  Discussed bracing and taping.  Topical anti-inflammatories.  Follow-up again in 4 weeks

## 2018-06-17 NOTE — Assessment & Plan Note (Signed)
Scapholunate disassociation noted.  Discussed icing regimen and home exercise.  Discussed which activities to do which wants to avoid.  Patient with increased activity slowly over the course of next several weeks.  Patient will do some mild bracing.  TFCC tear also noted that could be contributing as well.  Follow-up again in 4 to 6 weeks

## 2018-06-17 NOTE — Patient Instructions (Signed)
Good to see you  Labs downstairs Ice is your friend Ice 20 minutes 2 times daily. Usually after activity and before bed. Exercises 3 times a week.  pennsaid pinkie amount topically 2 times daily as needed.  Raise handlebars on the bike Stay active Over the coutner get  Vitamin D 2000 IU daily  Turmeric 500mg  daily  Tart cherry extract any dose at night See me again in 6 weeks

## 2018-06-18 ENCOUNTER — Encounter: Payer: Self-pay | Admitting: Family Medicine

## 2018-06-20 LAB — COBALT: Cobalt, Plasma: 1.2 mcg/L (ref <=1.8)

## 2018-06-20 LAB — CHROMIUM LEVEL: Chromium: 1 mcg/L (ref <=1.2)

## 2018-07-27 NOTE — Progress Notes (Signed)
[name] [address] [place], [place] [address] Phone: [phone] Subjective:   I Peter [name] am serving as a Education administrator for Dr. Hulan Saas.  I'm seeing this patient by the request  of:    CC: Right wrist pain follow-up  XKG:[id number]   06/17/2018 Muscle strain.  Likely secondary to more of a previous injury.  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  Discussed bracing and taping.  Topical anti-inflammatories.  Follow-up again in 4 weeks  Scapholunate disassociation noted.  Discussed icing regimen and home exercise.  Discussed which activities to do which wants to avoid.  Patient with increased activity slowly over the course of next several weeks.  Patient will do some mild bracing.  TFCC tear also noted that could be contributing as well.  Follow-up again in 4 to 6 weeks  Updated 07/29/2018 Peter [name] is a 63 y.o. male coming in with complaint of right wrist pain. States the wrist is about the same. Has been taping it while riding his back. Changed handle bars on bike. Exercises about 3 days a week for 15 miles.     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  Patient was to do bracing, home exercises and icing regimen. Patient states that he is about 30 to 40% better.  Change in his synovitis has made some changes alone.  Patient continues to workout on level 1 on a regular basis.  Past Medical History:  Diagnosis Date  . Anxiety state, unspecified 12/30/2012  . Arthritis   . Depression   . GERD (gastroesophageal reflux disease)   . Migraine   . Neck pain 11/22/2014   Past Surgical History:  Procedure Laterality Date  . right hip revision  2007  . right hip THA  2002  . TONSILLECTOMY    . 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 tumor removed     Social History   Socioeconomic History  . Marital status: Single    Spouse name: Not on file  . Number of children: Not on file  . Years of education: Not on file  . Highest education level: Not on file  Occupational History  . Not on file  Social Needs  . Financial resource strain: Not on file  . Food insecurity:    Worry: Not on file    Inability: Not on file  . Transportation needs:    Medical: Not on file    Non-medical: Not on file  Tobacco Use  . Smoking status: Never Smoker  . Smokeless tobacco: Never Used  Substance and Sexual Activity  . Alcohol use: Yes    Comment: occ wine  . Drug use: Yes    Types: Marijuana    Comment: last usage 1 week ago  . Sexual activity: Not on file  Lifestyle  . Physical activity:    Days per week: Not on file    Minutes per session: Not on file  . Stress: Not on file  Relationships  . Social connections:    Talks on phone: Not on file    Gets together: Not on file    Attends religious service: Not on file    Active member of club or organization: Not on file    Attends meetings  of clubs or organizations: Not on file    Relationship status: Not on file  Other Topics Concern  . Not on file  Social History Narrative  . Not on file   Allergies  Allergen Reactions  . Lunesta [Eszopiclone] Anaphylaxis and Other (See Comments)    Metal taste in mouth, lungs filled with fluid  . Demerol [Meperidine] Other (See Comments)    hallucinations  . Buspar [Buspirone] Other (See Comments)    Shaky, nausea   Family History  Problem Relation Age of Onset  . Alcoholism Unknown   . Arthritis Unknown   . Lung cancer Unknown   . Heart disease Unknown   . Stroke Unknown   . Hypertension Unknown   . Colon polyps Sister   . Colon cancer Neg Hx      Current Outpatient Medications (Cardiovascular):  .  rosuvastatin (CRESTOR) 20 MG tablet, Take 1 tablet (20 mg total) by mouth daily.   Current Outpatient  Medications (Analgesics):  Marland Kitchen  Acetaminophen (TYLENOL 8 HOUR PO), Take by mouth. Marland Kitchen  aspirin 81 MG chewable tablet, Chew 81 mg by mouth daily.  Current Outpatient Medications (Hematological):  Marland Kitchen  Cyanocobalamin (B-12) 1000 MCG CAPS, Take 1,000 mcg by mouth daily.  Current Outpatient Medications (Other):  .  b complex vitamins tablet, Take 1 tablet by mouth daily. .  clonazePAM (KLONOPIN) 0.5 MG tablet, Take 1 tablet (0.5 mg total) by mouth 2 (two) times daily as needed. .  Menthol, Topical Analgesic, (BIOFREEZE ROLL-ON) 4 % GEL, Apply 1 application topically at bedtime as needed (back pain). .  Multiple Vitamin (MULTIVITAMIN WITH MINERALS) TABS tablet, Take 1 tablet by mouth daily. .  Omega-3 Krill Oil 300 MG CAPS, Take 300 mg by mouth daily. Marland Kitchen  omeprazole (PRILOSEC) 40 MG capsule, Take 1 capsule (40 mg total) by mouth daily. Marland Kitchen  zolpidem (AMBIEN) 10 MG tablet, Take 1 tablet (10 mg total) by mouth at bedtime as needed for sleep.    Past medical history, social, surgical and family history all reviewed in electronic medical record.  No pertanent information unless stated regarding to the chief complaint.   Review of Systems:  No headache, visual changes, nausea, vomiting, diarrhea, constipation, dizziness, abdominal pain, skin rash, fevers, chills, night sweats, weight loss, swollen lymph nodes, body aches, joint swelling, muscle aches, chest pain, shortness of breath, mood changes.   Objective  Blood pressure 134/90, pulse 76, height 5\' 8"  (1.727 m), weight 187 lb (84.8 kg), SpO2 98 %.   General: No apparent distress alert and oriented x3 mood and affect normal, dressed appropriately.  HEENT: Pupils equal, extraocular movements intact  Respiratory: Patient's speak in full sentences and does not appear short of breath  Cardiovascular: No lower extremity edema, non tender, no erythema  Skin: Warm dry intact with no signs of infection or rash on extremities or on axial skeleton.  Abdomen:  Soft nontender  Neuro: Cranial nerves II through XII are intact, neurovascularly intact in all extremities with 2+ DTRs and 2+ pulses.  Lymph: No lymphadenopathy of posterior or anterior cervical chain or axillae bilaterally.  Gait normal with good balance and coordination.  MSK:  Non tender with full range of motion and good stability and symmetric strength and tone of shoulders, elbows, hip, knee and ankles bilaterally.  Wrist: Right Inspection normal with no visible erythema or swelling. ROM smooth and normal with good flexion and extension and ulnar/radial deviation that is symmetrical with opposite wrist. Palpation i more over the lunate  bone Mild positive snuffbox tenderness. No tenderness over Canal of Guyon. Strength 5/5 in all directions without pain. Negative Finkelstein, tinel's and phalens. Positive Watson's test.  \ Limited musculoskeletal ultrasound was performed and interpreted by Lyndal Pulley  Limited ultrasound shows the patient does have swelling still noted over the TFCC but improved.  Some calcific changes noted in the area.  Some abnormality of the lunate bone as well as the hook of the hamate noted.  Mild increase in Doppler flow in this area.  Scaphoid appears to be fairly unremarkable.  Patient does have a third compartment tenosynovitis noted of the extensor tendons.   Impression and Recommendations:     This case required medical decision making of moderate complexity. The above documentation has been reviewed and is accurate and complete Lyndal Pulley, DO       Note: This dictation was prepared with Dragon dictation along with smaller phrase technology. Any transcriptional errors that result from this process are unintentional.

## 2018-07-29 ENCOUNTER — Ambulatory Visit (INDEPENDENT_AMBULATORY_CARE_PROVIDER_SITE_OTHER)
Admission: RE | Admit: 2018-07-29 | Discharge: 2018-07-29 | Disposition: A | Discharge status: Home / Self Care | Payer: Medicare HMO | Source: Ambulatory Visit | Attending: Family Medicine | Admitting: Family Medicine

## 2018-07-29 ENCOUNTER — Ambulatory Visit: Payer: Medicare HMO | Admitting: Family Medicine

## 2018-07-29 ENCOUNTER — Other Ambulatory Visit: Payer: Self-pay

## 2018-07-29 ENCOUNTER — Ambulatory Visit: Payer: Self-pay

## 2018-07-29 ENCOUNTER — Encounter: Payer: Self-pay | Admitting: Family Medicine

## 2018-07-29 VITALS — BP 134/90 | HR 76 | Ht 68.0 in | Wt 187.0 lb

## 2018-07-29 DIAGNOSIS — M25531 Pain in right wrist: Secondary | ICD-10-CM

## 2018-07-29 DIAGNOSIS — M25331 Other instability, right wrist: Secondary | ICD-10-CM

## 2018-07-29 DIAGNOSIS — M24131 Other articular cartilage disorders, right wrist: Secondary | ICD-10-CM | POA: Diagnosis not present

## 2018-07-29 NOTE — Assessment & Plan Note (Signed)
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  Follow-up again in 4 to 6 weeks

## 2018-07-29 NOTE — Assessment & Plan Note (Signed)
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  X-rays ordered today to rule out any type of other calcific changes that could be contributing.  Bracing given, encourage patient to avoid heavy lifting.  Follow-up again in 5 to 6 weeks

## 2018-07-29 NOTE — Patient Instructions (Signed)
Good to see you  Ice is your friend Wear brace day and night for the next week then nightly for 2 weeks and can wear it for exercise Xray downstairs and I will write you in my chart  See me again in 6ish weeks

## 2018-09-09 ENCOUNTER — Ambulatory Visit: Payer: Self-pay | Admitting: Family Medicine

## 2018-10-16 ENCOUNTER — Other Ambulatory Visit: Payer: Self-pay | Admitting: Internal Medicine

## 2018-10-16 NOTE — Telephone Encounter (Signed)
Lorrin Mais too soon, since has total 6 mo tx from May 17, 2018; not due until about July 1

## 2018-10-22 ENCOUNTER — Ambulatory Visit (INDEPENDENT_AMBULATORY_CARE_PROVIDER_SITE_OTHER): Payer: Medicare HMO | Admitting: Internal Medicine

## 2018-10-22 ENCOUNTER — Other Ambulatory Visit: Payer: Self-pay

## 2018-10-22 ENCOUNTER — Encounter: Payer: Self-pay | Admitting: Internal Medicine

## 2018-10-22 DIAGNOSIS — F32A Depression, unspecified: Secondary | ICD-10-CM

## 2018-10-22 DIAGNOSIS — F329 Major depressive disorder, single episode, unspecified: Secondary | ICD-10-CM | POA: Diagnosis not present

## 2018-10-22 DIAGNOSIS — K529 Noninfective gastroenteritis and colitis, unspecified: Secondary | ICD-10-CM

## 2018-10-22 DIAGNOSIS — E785 Hyperlipidemia, unspecified: Secondary | ICD-10-CM | POA: Diagnosis not present

## 2018-10-22 DIAGNOSIS — R69 Illness, unspecified: Secondary | ICD-10-CM | POA: Diagnosis not present

## 2018-10-22 NOTE — Patient Instructions (Signed)
Ok to come by tomorrow for the work note excuse  OK to try taking the statin every other day for high cholesterol  Please continue all other medications as before, and refills have been done if requested.  Please have the pharmacy call with any other refills you may need.  Please continue your efforts at being more active, low cholesterol diet, and weight control.  Please keep your appointments with your specialists as you may have planned  .

## 2018-10-22 NOTE — Progress Notes (Signed)
Patient ID: Peter [name], male   DOB: [birth year], 63 y.o.   MRN: [id number]  Virtual Visit via Video Note  I connected with [name] on 10/22/18 at 11:00 AM EDT by a video enabled telemedicine application and verified that I am speaking with the correct person using two identifiers.  Location: Patient: at home Provider: at office   I discussed the limitations of evaluation and management by telemedicine and the availability of in person appointments. The patient expressed understanding and agreed to proceed.  History of Present Illness: Here to f/u with c/o 3 days onset crampy abd pains and diarrhea, without high fever, chills, n/v and Denies worsening reflux, dysphagia, or blood.  Overall feels much improved today, but will need to be able to returnto work.  Pt denies chest pain, increased sob or doe, wheezing, orthopnea, PND, increased LE swelling, palpitations, dizziness or syncope.   Pt denies polydipsia, polyuria. Denies worsening depressive symptoms, suicidal ideation, or panic; has ongoing anxiety, not increased recently.  Past Medical History:  Diagnosis Date  . Anxiety state, unspecified 12/30/2012  . Arthritis   . Depression   . GERD (gastroesophageal reflux disease)   . Migraine   . Neck pain 11/22/2014   Past Surgical History:  Procedure Laterality Date  . right hip revision  2007  . right hip THA  2002  . TONSILLECTOMY    . 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 tumor removed      reports that he has never smoked. He has never used smokeless tobacco. He reports current alcohol use. He reports current drug use. Drug: Marijuana. family history includes Alcoholism in his unknown relative; Arthritis in his unknown relative; Colon polyps in his sister; Heart disease in his unknown relative; Hypertension in his unknown relative;  Lung cancer in his unknown relative; Stroke in his unknown relative. Allergies  Allergen Reactions  . Lunesta [Eszopiclone] Anaphylaxis and Other (See Comments)    Metal taste in mouth, lungs filled with fluid  . Demerol [Meperidine] Other (See Comments)    hallucinations  . Buspar [Buspirone] Other (See Comments)    Shaky, nausea   Current Outpatient Medications on File Prior to Visit  Medication Sig Dispense Refill  . Acetaminophen (TYLENOL 8 HOUR PO) Take by mouth.    Marland Kitchen aspirin 81 MG chewable tablet Chew 81 mg by mouth daily.    Marland Kitchen b complex vitamins tablet Take 1 tablet by mouth daily.    . clonazePAM (KLONOPIN) 0.5 MG tablet Take 1 tablet (0.5 mg total) by mouth 2 (two) times daily as needed. 60 tablet 5  . Cyanocobalamin (B-12) 1000 MCG CAPS Take 1,000 mcg by mouth daily.    . Menthol, Topical Analgesic, (BIOFREEZE ROLL-ON) 4 % GEL Apply 1 application topically at bedtime as needed (back pain).    . Multiple Vitamin (MULTIVITAMIN WITH MINERALS) TABS tablet Take 1 tablet by mouth daily.    . Omega-3 Krill Oil 300 MG CAPS Take 300 mg by mouth daily.    Marland Kitchen omeprazole (PRILOSEC) 40 MG capsule Take 1 capsule (40 mg total) by mouth daily. 90 capsule 3  . rosuvastatin (CRESTOR) 20 MG tablet Take 1 tablet (20 mg total) by mouth daily. 90 tablet 3  . zolpidem (AMBIEN) 10 MG tablet Take 1 tablet (10 mg total) by mouth at bedtime as needed for sleep. 90 tablet  1   No current facility-administered medications on file prior to visit.     Observations/Objective: Alert, NAD, appropriate mood and affect, resps normal, cn 2-12 intact, moves all 4s, no visible rash or swelling Lab Results  Component Value Date   WBC 6.4 05/16/2018   HGB 15.5 05/16/2018   HCT 46.0 05/16/2018   PLT 265.0 05/16/2018   GLUCOSE 93 05/16/2018   CHOL 258 (H) 05/16/2018   TRIG 182.0 (H) 05/16/2018   HDL 54.10 05/16/2018   LDLDIRECT 146.0 02/10/2016   LDLCALC 167 (H) 05/16/2018   ALT 18 05/16/2018   AST 18  05/16/2018   NA 139 05/16/2018   K 4.3 05/16/2018   CL 103 05/16/2018   CREATININE 0.99 05/16/2018   BUN 19 05/16/2018   CO2 27 05/16/2018   TSH 2.64 05/16/2018   PSA 0.54 05/16/2018   INR 0.96 02/17/2016   HGBA1C 6.0 03/02/2017   Assessment and Plan: See notes  Follow Up Instructions: See notes   I discussed the assessment and treatment plan with the patient. The patient was provided an opportunity to ask questions and all were answered. The patient agreed with the plan and demonstrated an understanding of the instructions.   The patient was advised to call back or seek an in-person evaluation if the symptoms worsen or if the condition fails to improve as anticipated.   Cathlean Cower, MD

## 2018-10-23 ENCOUNTER — Encounter: Payer: Self-pay | Admitting: Internal Medicine

## 2018-10-23 DIAGNOSIS — K529 Noninfective gastroenteritis and colitis, unspecified: Secondary | ICD-10-CM | POA: Insufficient documentation

## 2018-10-23 NOTE — Assessment & Plan Note (Signed)
Resolving by hx, no further eval or tx needed, for work note as reqeusted,  to f/u any worsening symptoms or concerns

## 2018-10-23 NOTE — Assessment & Plan Note (Signed)
stable overall by history and exam, recent data reviewed with pt, and pt to continue medical treatment as before,  to f/u any worsening symptoms or concerns  

## 2018-10-28 DIAGNOSIS — R69 Illness, unspecified: Secondary | ICD-10-CM | POA: Diagnosis not present

## 2018-10-28 DIAGNOSIS — Z96649 Presence of unspecified artificial hip joint: Secondary | ICD-10-CM | POA: Diagnosis not present

## 2018-10-28 DIAGNOSIS — Z809 Family history of malignant neoplasm, unspecified: Secondary | ICD-10-CM | POA: Diagnosis not present

## 2018-11-05 ENCOUNTER — Ambulatory Visit (INDEPENDENT_AMBULATORY_CARE_PROVIDER_SITE_OTHER): Payer: Medicare HMO | Admitting: Family Medicine

## 2018-11-05 ENCOUNTER — Other Ambulatory Visit: Payer: Self-pay

## 2018-11-05 ENCOUNTER — Ambulatory Visit: Payer: Self-pay

## 2018-11-05 ENCOUNTER — Encounter: Payer: Self-pay | Admitting: Family Medicine

## 2018-11-05 VITALS — BP 126/84 | HR 72 | Ht 68.0 in | Wt 185.0 lb

## 2018-11-05 DIAGNOSIS — M25531 Pain in right wrist: Secondary | ICD-10-CM

## 2018-11-05 DIAGNOSIS — M24131 Other articular cartilage disorders, right wrist: Secondary | ICD-10-CM

## 2018-11-05 DIAGNOSIS — M25331 Other instability, right wrist: Secondary | ICD-10-CM

## 2018-11-05 NOTE — Patient Instructions (Addendum)
DHEA 50 mg for 4 weeks Injected wrist today See [place] when you need [place] [phone]

## 2018-11-05 NOTE — Progress Notes (Signed)
[name] [address] [place], [place] [address] Phone: [phone] Subjective:   Peter [name], am serving as a scribe for Dr. Hulan Saas.  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  X-rays ordered today to rule out any type of other calcific changes that could be contributing.  Bracing given, encourage patient to avoid heavy lifting.  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  Follow-up again in 4 to 6 weeks  Update 11/05/2018: Peter [name] is a 63 y.o. male coming in with complaint of right wrist pain. 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 Pain is over dorsal aspect with radial deviation. Is unable to sleep do to pain.   Past Medical History:  Diagnosis Date  . Anxiety state, unspecified 12/30/2012  . Arthritis   . Depression   . GERD (gastroesophageal reflux disease)   . Migraine   . Neck pain 11/22/2014   Past Surgical History:  Procedure Laterality Date  . right hip revision  2007  . right hip THA  2002  . TONSILLECTOMY    . 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 tumor removed     Social History   Socioeconomic History  . Marital status: Single    Spouse name: Not on file  . Number of children: Not on file  . Years of education: Not on file  . Highest education level: Not on file  Occupational History  . Not on file  Social Needs  . Financial resource strain: Not on file  . Food insecurity    Worry: Not on file    Inability: Not on file  . Transportation needs    Medical: Not on file    Non-medical: Not on file  Tobacco Use  . Smoking status: Never Smoker  .  Smokeless tobacco: Never Used  Substance and Sexual Activity  . Alcohol use: Yes    Comment: occ wine  . Drug use: Yes    Types: Marijuana    Comment: last usage 1 week ago  . Sexual activity: Not on file  Lifestyle  . Physical activity    Days per week: Not on file    Minutes per session: Not on file  . Stress: Not on file  Relationships  . Social Herbalist on phone: Not on file    Gets together: Not on file    Attends religious service: Not on file    Active member of club or organization: Not on file    Attends meetings of clubs or organizations: Not on file    Relationship status: Not on file  Other Topics Concern  . Not on file  Social History Narrative  . Not on file   Allergies  Allergen Reactions  . Lunesta [Eszopiclone] Anaphylaxis and Other (See Comments)    Metal taste in mouth, lungs filled with fluid  . Demerol [Meperidine] Other (See Comments)    hallucinations  . Buspar [Buspirone] Other (See Comments)    Shaky, nausea   Family History  Problem Relation Age of Onset  . Alcoholism Unknown   . Arthritis Unknown   . Lung cancer  Unknown   . Heart disease Unknown   . Stroke Unknown   . Hypertension Unknown   . Colon polyps Sister   . Colon cancer Neg Hx      Current Outpatient Medications (Cardiovascular):  .  rosuvastatin (CRESTOR) 20 MG tablet, Take 1 tablet (20 mg total) by mouth daily.   Current Outpatient Medications (Analgesics):  Marland Kitchen  Acetaminophen (TYLENOL 8 HOUR PO), Take by mouth. Marland Kitchen  aspirin 81 MG chewable tablet, Chew 81 mg by mouth daily.  Current Outpatient Medications (Hematological):  Marland Kitchen  Cyanocobalamin (B-12) 1000 MCG CAPS, Take 1,000 mcg by mouth daily.  Current Outpatient Medications (Other):  .  b complex vitamins tablet, Take 1 tablet by mouth daily. .  clonazePAM (KLONOPIN) 0.5 MG tablet, Take 1 tablet (0.5 mg total) by mouth 2 (two) times daily as needed. .  Menthol, Topical Analgesic, (BIOFREEZE ROLL-ON) 4 % GEL,  Apply 1 application topically at bedtime as needed (back pain). .  Multiple Vitamin (MULTIVITAMIN WITH MINERALS) TABS tablet, Take 1 tablet by mouth daily. .  Omega-3 Krill Oil 300 MG CAPS, Take 300 mg by mouth daily. Marland Kitchen  omeprazole (PRILOSEC) 40 MG capsule, Take 1 capsule (40 mg total) by mouth daily. Marland Kitchen  zolpidem (AMBIEN) 10 MG tablet, Take 1 tablet (10 mg total) by mouth at bedtime as needed for sleep.    Past medical history, social, surgical and family history all reviewed in electronic medical record.  [name] pertanent information unless stated regarding to the chief complaint.   Review of Systems:  [name] headache, visual changes, nausea, vomiting, diarrhea, constipation, dizziness, abdominal pain, skin rash, fevers, chills, night sweats, weight loss, swollen lymph nodes, body aches, joint swelling, muscle aches, chest pain, shortness of breath, mood changes.   Objective  Blood pressure 126/84, pulse 72, height 5\' 8"  (1.727 m), weight 185 lb (83.9 kg), SpO2 99 %.   General: [name] apparent distress alert and oriented x3 mood and affect normal, dressed appropriately.  HEENT: Pupils equal, extraocular movements intact  Respiratory: Patient's speak in full sentences and does not appear short of breath  Cardiovascular: [name] lower extremity edema, non tender, [name] erythema  Skin: Warm dry intact with [name] signs of infection or rash on extremities or on axial skeleton.  Abdomen: Soft nontender  Neuro: Cranial nerves II through XII are intact, neurovascularly intact in all extremities with 2+ DTRs and 2+ pulses.  Lymph: [name] lymphadenopathy of posterior or anterior cervical chain or axillae bilaterally.  Gait normal with good balance and coordination.  MSK:  Non tender with full range of motion and good stability and symmetric strength and tone of shoulders, elbows, wrist, hip, knee and ankles bilaterally.  Wrist: Inspection normal with [name] visible erythema or swelling. ROM smooth and normal with good flexion  and extension and ulnar/radial deviation that is symmetrical with opposite wrist. Palpation is normal over metacarpals, navicular, lunate, and TFCC; tendons without tenderness/ swelling [name] snuffbox tenderness. [name] tenderness over Canal of Guyon. Strength 5/5 in all directions without pain. Negative Finkelstein, tinel's and phalens. Negative Watson's test.  Ltd msk was performed and interpreted by Lyndal Pulley  Ultrasound shows mild hypoechoic changes still in the TFCC with a potential tear noted.  Significant improvement though from previous exam.  Procedure: Real-time Ultrasound Guided Injection of right TMC seen Device: GE Logiq Q7 Ultrasound guided injection is preferred based studies that show increased duration, increased effect, greater accuracy, decreased procedural pain, increased response rate, and decreased cost with ultrasound guided versus  blind injection.  Verbal informed consent obtained.  Time-out conducted.  Noted [name] overlying erythema, induration, or other signs of local infection.  Skin prepped in a sterile fashion.  Local anesthesia: Topical Ethyl chloride.  With sterile technique and under real time ultrasound guidance: With a 25-gauge half inch needle injected with 0.5 cc of 0.5% Marcaine and 0 Kenalog 40 mg/mL Completed without difficulty  Pain immediately resolved suggesting accurate placement of the medication.  Advised to call if fevers/chills, erythema, induration, drainage, or persistent bleeding.  Images permanently stored and available for review in the ultrasound unit.  Impression: Technically successful ultrasound guided injection.   Impression and Recommendations:     This case required medical decision making of moderate complexity. The above documentation has been reviewed and is accurate and complete Lyndal Pulley, DO       Note: This dictation was prepared with Dragon dictation along with smaller phrase technology. Any transcriptional errors  that result from this process are unintentional.

## 2018-11-05 NOTE — Assessment & Plan Note (Signed)
Patient does have a degenerative tear given his injection.  Tolerated the procedure well.  Discussed icing regimen and home exercises.  Discussed bracing with lifting follow-up again 6 weeks

## 2018-11-10 ENCOUNTER — Other Ambulatory Visit: Payer: Self-pay | Admitting: Internal Medicine

## 2018-11-11 NOTE — Telephone Encounter (Signed)
Done erx 

## 2018-11-24 ENCOUNTER — Encounter: Payer: Self-pay | Admitting: Internal Medicine

## 2018-11-26 ENCOUNTER — Telehealth: Payer: Self-pay

## 2018-11-26 NOTE — Telephone Encounter (Signed)
Previous msg was received in regard and pharmacy has already been updated   Copied from [place] [phone]. Topic: General - Other >> Nov 26, 2018  8:18 AM Virl Axe D wrote: Reason for CRM: Pt stated he will be living in [place] [place] [facility] for a while working and would like for all rx's to go to the local pharmacy down there. Please advise.  [id number] - [address], [place] [phone] (Phone) [phone] (Fax)

## 2019-01-31 DIAGNOSIS — R69 Illness, unspecified: Secondary | ICD-10-CM | POA: Diagnosis not present

## 2019-02-07 ENCOUNTER — Other Ambulatory Visit: Payer: Self-pay | Admitting: Internal Medicine

## 2019-03-04 DIAGNOSIS — Z96643 Presence of artificial hip joint, bilateral: Secondary | ICD-10-CM | POA: Diagnosis not present

## 2019-03-04 DIAGNOSIS — S76211A Strain of adductor muscle, fascia and tendon of right thigh, initial encounter: Secondary | ICD-10-CM | POA: Diagnosis not present

## 2019-03-04 DIAGNOSIS — S76011A Strain of muscle, fascia and tendon of right hip, initial encounter: Secondary | ICD-10-CM | POA: Diagnosis not present

## 2019-03-04 DIAGNOSIS — Z7982 Long term (current) use of aspirin: Secondary | ICD-10-CM | POA: Diagnosis not present

## 2019-03-04 DIAGNOSIS — M25551 Pain in right hip: Secondary | ICD-10-CM | POA: Diagnosis not present

## 2019-03-04 DIAGNOSIS — X500XXA Overexertion from strenuous movement or load, initial encounter: Secondary | ICD-10-CM | POA: Diagnosis not present

## 2019-03-19 ENCOUNTER — Encounter: Payer: Self-pay | Admitting: Gastroenterology

## 2019-04-14 ENCOUNTER — Encounter: Payer: Self-pay | Admitting: Gastroenterology

## 2019-05-04 ENCOUNTER — Encounter: Payer: Self-pay | Admitting: Internal Medicine

## 2019-05-19 ENCOUNTER — Ambulatory Visit (INDEPENDENT_AMBULATORY_CARE_PROVIDER_SITE_OTHER): Payer: Medicare HMO | Admitting: Internal Medicine

## 2019-05-19 ENCOUNTER — Encounter: Payer: Self-pay | Admitting: Internal Medicine

## 2019-05-19 ENCOUNTER — Other Ambulatory Visit: Payer: Self-pay

## 2019-05-19 VITALS — BP 130/82 | HR 74 | Temp 98.5°F | Resp 12 | Ht 68.0 in | Wt 178.6 lb

## 2019-05-19 DIAGNOSIS — E538 Deficiency of other specified B group vitamins: Secondary | ICD-10-CM | POA: Diagnosis not present

## 2019-05-19 DIAGNOSIS — R739 Hyperglycemia, unspecified: Secondary | ICD-10-CM

## 2019-05-19 DIAGNOSIS — E559 Vitamin D deficiency, unspecified: Secondary | ICD-10-CM

## 2019-05-19 DIAGNOSIS — Z0000 Encounter for general adult medical examination without abnormal findings: Secondary | ICD-10-CM

## 2019-05-19 DIAGNOSIS — E611 Iron deficiency: Secondary | ICD-10-CM

## 2019-05-19 DIAGNOSIS — Z114 Encounter for screening for human immunodeficiency virus [HIV]: Secondary | ICD-10-CM

## 2019-05-19 DIAGNOSIS — R69 Illness, unspecified: Secondary | ICD-10-CM | POA: Diagnosis not present

## 2019-05-19 LAB — BASIC METABOLIC PANEL
BUN: 11 mg/dL (ref 6–23)
CO2: 29 mEq/L (ref 19–32)
Calcium: 9.8 mg/dL (ref 8.4–10.5)
Chloride: 103 mEq/L (ref 96–112)
Creatinine, Ser: 0.81 mg/dL (ref 0.40–1.50)
GFR: 96.48 mL/min (ref >60.00)
Glucose, Bld: 92 mg/dL (ref 70–99)
Potassium: 4.5 mEq/L (ref 3.5–5.1)
Sodium: 140 mEq/L (ref 135–145)

## 2019-05-19 LAB — CBC WITH DIFFERENTIAL/PLATELET
Basophils Absolute: 0.1 10*3/uL (ref 0.0–0.1)
Basophils Relative: 1 % (ref 0.0–3.0)
Eosinophils Absolute: 0.1 10*3/uL (ref 0.0–0.7)
Eosinophils Relative: 1 % (ref 0.0–5.0)
HCT: 42.8 % (ref 39.0–52.0)
Hemoglobin: 14.3 g/dL (ref 13.0–17.0)
Lymphocytes Relative: 27.3 % (ref 12.0–46.0)
Lymphs Abs: 1.5 10*3/uL (ref 0.7–4.0)
MCHC: 33.3 g/dL (ref 30.0–36.0)
MCV: 94.3 fl (ref 78.0–100.0)
Monocytes Absolute: 0.6 10*3/uL (ref 0.1–1.0)
Monocytes Relative: 10.4 % (ref 3.0–12.0)
Neutro Abs: 3.4 10*3/uL (ref 1.4–7.7)
Neutrophils Relative %: 60.3 % (ref 43.0–77.0)
Platelets: 247 10*3/uL (ref 150.0–400.0)
RBC: 4.54 Mil/uL (ref 4.22–5.81)
RDW: 14.5 % (ref 11.5–15.5)
WBC: 5.6 10*3/uL (ref 4.0–10.5)

## 2019-05-19 LAB — HEPATIC FUNCTION PANEL
ALT: 20 U/L (ref 0–53)
AST: 20 U/L (ref 0–37)
Albumin: 4.8 g/dL (ref 3.5–5.2)
Alkaline Phosphatase: 81 U/L (ref 39–117)
Bilirubin, Direct: 0.1 mg/dL (ref 0.0–0.3)
Total Bilirubin: 0.4 mg/dL (ref 0.2–1.2)
Total Protein: 7.1 g/dL (ref 6.0–8.3)

## 2019-05-19 LAB — LIPID PANEL
Cholesterol: 271 mg/dL — ABNORMAL HIGH (ref 0–200)
HDL: 59.6 mg/dL (ref >39.00)
LDL Cholesterol: 189 mg/dL — ABNORMAL HIGH (ref 0–99)
NonHDL: 211.58
Total CHOL/HDL Ratio: 5
Triglycerides: 111 mg/dL (ref 0.0–149.0)
VLDL: 22.2 mg/dL (ref 0.0–40.0)

## 2019-05-19 LAB — URINALYSIS, ROUTINE W REFLEX MICROSCOPIC
Bilirubin Urine: NEGATIVE
Hgb urine dipstick: NEGATIVE
Ketones, ur: NEGATIVE
Leukocytes,Ua: NEGATIVE
Nitrite: NEGATIVE
RBC / HPF: NONE SEEN (ref 0–?)
Specific Gravity, Urine: 1.025 (ref 1.000–1.030)
Total Protein, Urine: NEGATIVE
Urine Glucose: NEGATIVE
Urobilinogen, UA: 0.2 (ref 0.0–1.0)
pH: 5.5 (ref 5.0–8.0)

## 2019-05-19 LAB — VITAMIN D 25 HYDROXY (VIT D DEFICIENCY, FRACTURES): VITD: 31.93 ng/mL (ref 30.00–100.00)

## 2019-05-19 LAB — IBC PANEL
Iron: 148 ug/dL (ref 42–165)
Saturation Ratios: 35.4 % (ref 20.0–50.0)
Transferrin: 299 mg/dL (ref 212.0–360.0)

## 2019-05-19 LAB — TSH: TSH: 2.42 u[IU]/mL (ref 0.35–4.50)

## 2019-05-19 LAB — PSA: PSA: 0.52 ng/mL (ref 0.10–4.00)

## 2019-05-19 LAB — VITAMIN B12: Vitamin B-12: 465 pg/mL (ref 211–911)

## 2019-05-19 LAB — HEMOGLOBIN A1C: Hgb A1c MFr Bld: 5.8 % (ref 4.6–6.5)

## 2019-05-19 MED ORDER — ROSUVASTATIN CALCIUM 20 MG PO TABS: 20.0000 mg | ORAL_TABLET | Freq: Every day | ORAL | 3 refills | Status: DC

## 2019-05-19 MED ORDER — OMEPRAZOLE 40 MG PO CPDR: DELAYED_RELEASE_CAPSULE | ORAL | 3 refills | Status: DC

## 2019-05-19 MED ORDER — ZOLPIDEM TARTRATE 10 MG PO TABS: 10.0000 mg | ORAL_TABLET | Freq: Every evening | ORAL | 1 refills | Status: DC | PRN

## 2019-05-19 MED ORDER — CLONAZEPAM 0.5 MG PO TABS: 0.5000 mg | ORAL_TABLET | Freq: Two times a day (BID) | ORAL | 5 refills | Status: DC | PRN

## 2019-05-19 NOTE — Patient Instructions (Signed)
Please continue all other medications as before, and refills have been done if requested.  Please have the pharmacy call with any other refills you may need.  Please continue your efforts at being more active, low cholesterol diet, and weight control.  You are otherwise up to date with prevention measures today.  Please keep your appointments with your specialists as you may have planned  Please go to the LAB at the blood drawing area for the tests to be done  You will be contacted by phone if any changes need to be made immediately.  Otherwise, you will receive a letter about your results with an explanation, but please check with MyChart first.  Please remember to sign up for MyChart if you have not done so, as this will be important to you in the future with finding out test results, communicating by private email, and scheduling acute appointments online when needed.  Please return in 1 year for your yearly visit, or sooner if needed, with Lab testing done 3-5 days before at the [facility] - please make appt as you leave today for this

## 2019-05-19 NOTE — Telephone Encounter (Signed)
Copied from [place] [id number]. Topic: Clinical - COVID Pre-Screen >> May 19, 2019 12:56 PM Blase Mess A wrote: 1. To the best of your knowledge, have you been in close contact with anyone with a confirmed diagnosis of COVID 19?  NO  If no - Proceed to next question; If yes - Schedule patient for a virtual visit  2. Have you had any one or more of the following: fever, chills, cough, shortness of breath or any flu-like symptoms?  NO  If no - Proceed to next question; If yes - Schedule patient for a virtual visit  3. Have you been diagnosed with or have a previous diagnosis of COVID 19? NO  If no - Proceed to next question; If yes - Schedule patient for a virtual visit  4. I am going to go over a few other symptoms with you. Please let me know if you are experiencing any of the following: NO  Ear, nose or throat discomfort  A sore throat  Headache  Muscle pain  Diarrhea  Loss of taste or smell  If no - Continue with scheduling process; If yes - Document in scheduling notes   Thank you for answering these questions. Please know we will ask you these questions or similar questions when you arrive for your appointment and again it's how we are keeping everyone safe. Also, to keep you safe, please use the provided hand sanitizer when you enter the building. [name], we are asking everyone in the building to wear a mask because they help [place] prevent the spread of germs.   Do you have a mask of your own, if not, we are happy to provide one for you. The last thing I want to go over with you is the no visitor guidelines. This means no one can attend the appointment with you unless you need physical assistance. I understand this may be different from your past appointments and I know this may be difficult but please know if someone is driving you we are happy to call them for you once your appointment is over.

## 2019-05-19 NOTE — Progress Notes (Signed)
Subjective:    Patient ID: Peter [name], male    DOB: 07-Nov-1955, 64 y.o.   MRN: [id number]  HPI    Here for wellness and f/u;  Overall doing ok;  Pt denies Chest pain, worsening SOB, DOE, wheezing, orthopnea, PND, worsening LE edema, palpitations, dizziness or syncope.  Pt denies neurological change such as new headache, facial or extremity weakness.  Pt denies polydipsia, polyuria, or low sugar symptoms. Pt states overall good compliance with treatment and medications, good tolerability, and has been trying to follow appropriate diet.  Pt denies worsening depressive symptoms, suicidal ideation or panic. No fever, night sweats, wt loss, loss of appetite, or other constitutional symptoms.  Pt states good ability with ADL's, has low fall risk, home safety reviewed and adequate, no other significant changes in hearing or vision, and only occasionally active with exercise.  Working out about 3 hrs per wk. Down wt with better diet Wt Readings from Last 3 Encounters:  05/19/19 178 lb 9.6 oz (81 kg)  11/05/18 185 lb (83.9 kg)  07/29/18 187 lb (84.8 kg)  Now lives in [place], retired but still consults occasionally.  Roommate niece is covid + but he has no direct exposure to either yet.  Admits to cannabis occasional use in the evening.  Taking the statin about every 1-3 days with no myalgias. Past Medical History:  Diagnosis Date  . Anxiety state, unspecified 12/30/2012  . Arthritis   . Depression   . GERD (gastroesophageal reflux disease)   . Migraine   . Neck pain 11/22/2014   Past Surgical History:  Procedure Laterality Date  . right hip revision  2007  . right hip THA  2002  . TONSILLECTOMY    . 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 tumor removed      reports that he has never smoked. He has never used smokeless tobacco. He reports current  alcohol use. He reports current drug use. Drug: Marijuana. family history includes Alcoholism in an other family member; Arthritis in an other family member; Colon polyps in his sister; Heart disease in an other family member; Hypertension in an other family member; Lung cancer in an other family member; Stroke in an other family member. Allergies  Allergen Reactions  . Lunesta [Eszopiclone] Anaphylaxis and Other (See Comments)    Metal taste in mouth, lungs filled with fluid  . Demerol [Meperidine] Other (See Comments)    hallucinations  . Buspar [Buspirone] Other (See Comments)    Shaky, nausea   Current Outpatient Medications on File Prior to Visit  Medication Sig Dispense Refill  . aspirin EC 81 MG tablet Take 81 mg by mouth daily.    . clonazePAM (KLONOPIN) 0.5 MG tablet TAKE 1 TABLET (0.5 MG TOTAL) BY MOUTH 2 (TWO) TIMES DAILY AS NEEDED. 60 tablet 5  . Menthol, Topical Analgesic, (BIOFREEZE ROLL-ON) 4 % GEL Apply 1 application topically at bedtime as needed (back pain).    . Multiple Vitamin (MULTIVITAMIN WITH MINERALS) TABS tablet Take 1 tablet by mouth daily.    . Omega-3 Krill Oil 300 MG CAPS Take 300 mg by mouth daily.    Marland Kitchen omeprazole (PRILOSEC) 40 MG capsule TAKE 1 CAPSULE BY MOUTH EVERY DAY 90 capsule 1  . zolpidem (AMBIEN) 10 MG tablet TAKE 1 TABLET (10 MG TOTAL) BY MOUTH AT BEDTIME AS NEEDED FOR  SLEEP. 90 tablet 1  . Acetaminophen (TYLENOL 8 HOUR PO) Take by mouth.    Marland Kitchen aspirin 81 MG chewable tablet Chew 81 mg by mouth daily.    Marland Kitchen b complex vitamins tablet Take 1 tablet by mouth daily.    . Cyanocobalamin (B-12) 1000 MCG CAPS Take 1,000 mcg by mouth daily.    . rosuvastatin (CRESTOR) 20 MG tablet Take 1 tablet (20 mg total) by mouth daily. 90 tablet 3   No current facility-administered medications on file prior to visit.   Review of Systems Constitutional: Negative for other unusual diaphoresis, sweats, appetite or weight changes HENT: Negative for other worsening hearing  loss, ear pain, facial swelling, mouth sores or neck stiffness.   Eyes: Negative for other worsening pain, redness or other visual disturbance.  Respiratory: Negative for other stridor or swelling Cardiovascular: Negative for other palpitations or other chest pain  Gastrointestinal: Negative for worsening diarrhea or loose stools, blood in stool, distention or other pain Genitourinary: Negative for hematuria, flank pain or other change in urine volume.  Musculoskeletal: Negative for myalgias or other joint swelling.  Skin: Negative for other color change, or other wound or worsening drainage.  Neurological: Negative for other syncope or numbness. Hematological: Negative for other adenopathy or swelling Psychiatric/Behavioral: Negative for hallucinations, other worsening agitation, SI, self-injury, or new decreased concentration All otherwise neg per pt     Objective:   Physical Exam BP 130/82   Pulse 74   Temp 98.5 F (36.9 C)   Resp 12   Ht 5\' 8"  (1.727 m)   Wt 178 lb 9.6 oz (81 kg)   SpO2 98%   BMI 27.16 kg/m  VS noted,  Constitutional: Pt is oriented to person, place, and time. Appears well-developed and well-nourished, in no significant distress and comfortable Head: Normocephalic and atraumatic  Eyes: Conjunctivae and EOM are normal. Pupils are equal, round, and reactive to light Right Ear: External ear normal without discharge Left Ear: External ear normal without discharge Nose: Nose without discharge or deformity Mouth/Throat: Oropharynx is without other ulcerations and moist  Neck: Normal range of motion. Neck supple. No JVD present. No tracheal deviation present or significant neck LA or mass Cardiovascular: Normal rate, regular rhythm, normal heart sounds and intact distal pulses.   Pulmonary/Chest: WOB normal and breath sounds without rales or wheezing  Abdominal: Soft. Bowel sounds are normal. NT. No HSM  Musculoskeletal: Normal range of motion. Exhibits no  edema Lymphadenopathy: Has no other cervical adenopathy.  Neurological: Pt is alert and oriented to person, place, and time. Pt has normal reflexes. No cranial nerve deficit. Motor grossly intact, Gait intact Skin: Skin is warm and dry. No rash noted or new ulcerations Psychiatric:  Has normal mood and affect. Behavior is normal without agitation All otherwise neg per pt Lab Results  Component Value Date   WBC 6.4 05/16/2018   HGB 15.5 05/16/2018   HCT 46.0 05/16/2018   PLT 265.0 05/16/2018   GLUCOSE 93 05/16/2018   CHOL 258 (H) 05/16/2018   TRIG 182.0 (H) 05/16/2018   HDL 54.10 05/16/2018   LDLDIRECT 146.0 02/10/2016   LDLCALC 167 (H) 05/16/2018   ALT 18 05/16/2018   AST 18 05/16/2018   NA 139 05/16/2018   K 4.3 05/16/2018   CL 103 05/16/2018   CREATININE 0.99 05/16/2018   BUN 19 05/16/2018   CO2 27 05/16/2018   TSH 2.64 05/16/2018   PSA 0.54 05/16/2018   INR 0.96 02/17/2016   HGBA1C 6.0  03/02/2017      Assessment & Plan:

## 2019-05-19 NOTE — Assessment & Plan Note (Signed)

## 2019-05-20 LAB — HIV ANTIBODY (ROUTINE TESTING W REFLEX): HIV 1&2 Ab, 4th Generation: NONREACTIVE

## 2019-08-19 ENCOUNTER — Telehealth: Payer: Self-pay | Admitting: Internal Medicine

## 2019-08-19 NOTE — Telephone Encounter (Signed)
Please call pt and inform he will need to make an appt w/MD for excuse note./lmb

## 2019-08-19 NOTE — Telephone Encounter (Signed)
   Patient requesting a return to work note. Patient states he was having headaches due to frequent use of gemscope. Patient lives about 3 hours away but is agreeable to schedule office visit if needed.   Please advise

## 2019-08-21 ENCOUNTER — Ambulatory Visit: Payer: Medicare HMO | Admitting: Internal Medicine

## 2019-08-25 ENCOUNTER — Ambulatory Visit: Payer: Medicare HMO | Admitting: Family Medicine

## 2019-08-25 ENCOUNTER — Encounter: Payer: Self-pay | Admitting: Family Medicine

## 2019-08-28 ENCOUNTER — Other Ambulatory Visit: Payer: Self-pay

## 2019-08-28 ENCOUNTER — Ambulatory Visit: Payer: Medicare HMO | Admitting: Family Medicine

## 2019-08-28 ENCOUNTER — Encounter: Payer: Self-pay | Admitting: Family Medicine

## 2019-08-28 ENCOUNTER — Ambulatory Visit (INDEPENDENT_AMBULATORY_CARE_PROVIDER_SITE_OTHER): Payer: Medicare HMO

## 2019-08-28 VITALS — BP 124/88 | HR 92 | Ht 68.0 in | Wt 176.0 lb

## 2019-08-28 DIAGNOSIS — M25531 Pain in right wrist: Secondary | ICD-10-CM | POA: Diagnosis not present

## 2019-08-28 DIAGNOSIS — M24131 Other articular cartilage disorders, right wrist: Secondary | ICD-10-CM | POA: Diagnosis not present

## 2019-08-28 NOTE — Progress Notes (Signed)
[name] Phone: [phone] Subjective:   Peter [name], am serving as a scribe for Dr. Hulan Saas. This visit occurred during the SARS-CoV-2 public health emergency.  Safety protocols were in place, including screening questions prior to the visit, additional usage of staff PPE, and extensive cleaning of exam room while observing appropriate contact time as indicated for disinfecting solutions.   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  Tolerated the procedure well.  Discussed icing regimen and home exercises.  Discussed bracing with lifting follow-up again 6 weeks  Update 08/28/2019 Peter [name] is a 64 y.o. male coming in with complaint of right wrist pain for 2 months. Pain with ulnar deviation. 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 Using Tylenol and CBD oil. Wakes up at night in pain.      Past Medical History:  Diagnosis Date  . Anxiety state, unspecified 12/30/2012  . Arthritis   . Depression   . GERD (gastroesophageal reflux disease)   . Migraine   . Neck pain 11/22/2014   Past Surgical History:  Procedure Laterality Date  . right hip revision  2007  . right hip THA  2002  . TONSILLECTOMY    . 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 tumor removed     Social History   Socioeconomic History  . Marital status: Single    Spouse name: Not on file  . Number of children: 2  . Years of education: Not on file  . Highest education level: Not on file  Occupational History  . Not on file  Tobacco Use  . Smoking status: Never Smoker  . Smokeless  tobacco: Never Used  Substance and Sexual Activity  . Alcohol use: Yes    Comment: occ wine  . Drug use: Yes    Types: Marijuana    Comment: last usage 1 week ago  . Sexual activity: Not on file  Other Topics Concern  . Not on file  Social History Narrative  . Not on file   Social Determinants of Health   Financial Resource Strain:   . Difficulty of Paying Living Expenses:   Food Insecurity:   . Worried About Charity fundraiser in the Last Year:   . Arboriculturist in the Last Year:   Transportation Needs:   . Film/video editor (Medical):   Marland Kitchen Lack of Transportation (Non-Medical):   Physical Activity:   . Days of Exercise per Week:   . Minutes of Exercise per Session:   Stress:   . Feeling of Stress :   Social Connections:   . Frequency of Communication with Friends and Family:   . Frequency of Social Gatherings with Friends and Family:   . Attends Religious Services:   . Active Member of Clubs or Organizations:   . Attends Archivist Meetings:   Marland Kitchen Marital Status:    Allergies  Allergen Reactions  . Lunesta [Eszopiclone] Anaphylaxis and Other (See Comments)    Metal taste in mouth, lungs filled with fluid  . Demerol [Meperidine] Other (See Comments)  hallucinations  . Buspar [Buspirone] Other (See Comments)    Shaky, nausea   Family History  Problem Relation Age of Onset  . Alcoholism Other   . Arthritis Other   . Lung cancer Other   . Heart disease Other   . Stroke Other   . Hypertension Other   . Colon polyps Sister   . Colon cancer Neg Hx      Current Outpatient Medications (Cardiovascular):  .  rosuvastatin (CRESTOR) 20 MG tablet, Take 1 tablet (20 mg total) by mouth daily.   Current Outpatient Medications (Analgesics):  Marland Kitchen  Acetaminophen (TYLENOL 8 HOUR PO), Take by mouth. Marland Kitchen  aspirin 81 MG chewable tablet, Chew 81 mg by mouth daily. Marland Kitchen  aspirin EC 81 MG tablet, Take 81 mg by mouth daily.  Current Outpatient Medications  (Hematological):  Marland Kitchen  Cyanocobalamin (B-12) 1000 MCG CAPS, Take 1,000 mcg by mouth daily.  Current Outpatient Medications (Other):  .  b complex vitamins tablet, Take 1 tablet by mouth daily. .  clonazePAM (KLONOPIN) 0.5 MG tablet, Take 1 tablet (0.5 mg total) by mouth 2 (two) times daily as needed. .  Menthol, Topical Analgesic, (BIOFREEZE ROLL-ON) 4 % GEL, Apply 1 application topically at bedtime as needed (back pain). .  Multiple Vitamin (MULTIVITAMIN WITH MINERALS) TABS tablet, Take 1 tablet by mouth daily. .  Omega-3 Krill Oil 300 MG CAPS, Take 300 mg by mouth daily. Marland Kitchen  omeprazole (PRILOSEC) 40 MG capsule, TAKE 1 CAPSULE BY MOUTH EVERY DAY .  zolpidem (AMBIEN) 10 MG tablet, Take 1 tablet (10 mg total) by mouth at bedtime as needed for sleep.   Reviewed prior external information including notes and imaging from  primary care provider As well as notes that were available from care everywhere and other healthcare systems.  Past medical history, social, surgical and family history all reviewed in electronic medical record.  [name] pertanent information unless stated regarding to the chief complaint.   Review of Systems:  [name] headache, visual changes, nausea, vomiting, diarrhea, constipation, dizziness, abdominal pain, skin rash, fevers, chills, night sweats, weight loss, swollen lymph nodes, body aches, joint swelling, chest pain, shortness of breath, mood changes. POSITIVE muscle aches  Objective  Blood pressure 124/88, pulse 92, height 5\' 8"  (1.727 m), weight 176 lb (79.8 kg), SpO2 98 %.   General: [name] apparent distress alert and oriented x3 mood and affect normal, dressed appropriately.  HEENT: Pupils equal, extraocular movements intact  Respiratory: Patient's speak in full sentences and does not appear short of breath  Cardiovascular: [name] lower extremity edema, non tender, [name] erythema  Neuro: Cranial nerves II through XII are intact, neurovascularly intact in all extremities with 2+ DTRs  and 2+ pulses.  Gait normal with good balance and coordination.  MSK: Right wrist exam shows the patient does have some tenderness over the TFCC joint.  Patient does have limited extension of the wrist.  Good grip strength noted.  Neurovascular intact otherwise.  Procedure: Real-time Ultrasound Guided Injection of right TFCC Device: GE Logiq Q7 Ultrasound guided injection is preferred based studies that show increased duration, increased effect, greater accuracy, decreased procedural pain, increased response rate, and decreased cost with ultrasound guided versus blind injection.  Verbal informed consent obtained.  Time-out conducted.  Noted [name] overlying erythema, induration, or other signs of local infection.  Skin prepped in a sterile fashion.  Local anesthesia: Topical Ethyl chloride.  With sterile technique and under real time ultrasound guidance: With a 25-gauge half inch needle injected  with 0.5 cc of 0.5% Marcaine and 0.5 cc of Kenalog 40 mg/mL Completed without difficulty  Pain immediately resolved suggesting accurate placement of the medication.  Advised to call if fevers/chills, erythema, induration, drainage, or persistent bleeding.  Images permanently stored and available for review in the ultrasound unit.  Impression: Technically successful ultrasound guided injection.    Impression and Recommendations:     This case required medical decision making of moderate complexity. The above documentation has been reviewed and is accurate and complete Lyndal Pulley, DO       Note: This dictation was prepared with Dragon dictation along with smaller phrase technology. Any transcriptional errors that result from this process are unintentional.

## 2019-08-28 NOTE — Patient Instructions (Signed)
DHEA 50mg  Glutamine 5mg  daily PRP See me when you need me

## 2019-08-28 NOTE — Assessment & Plan Note (Signed)
Injection given again today secondary to the chronic problem and is an exacerbation.  Discussed different medication management as well.  Patient though he is doing relatively well.  Discussed potential bracing.  Given handout about PRP if any worsening symptoms.  Follow-up again in 4 to 8 weeks

## 2019-09-01 ENCOUNTER — Telehealth: Payer: Self-pay | Admitting: Family Medicine

## 2019-09-01 NOTE — Telephone Encounter (Signed)
Pt seen last week, noticed today that his med list is not up to date. Please remove the chewable aspirin, the b complex vitamins, and the B12 1000 MCG caps.  Also, pt wanted you to know that his wrist is 80% better.

## 2019-09-02 ENCOUNTER — Encounter: Payer: Self-pay | Admitting: Family Medicine

## 2019-09-02 NOTE — Addendum Note (Signed)
Addended by: Carmie Kanner on: 09/02/2019 07:19 AM   Modules accepted: Orders

## 2019-09-18 ENCOUNTER — Encounter: Payer: Self-pay | Admitting: Gastroenterology

## 2019-09-18 ENCOUNTER — Telehealth: Payer: Self-pay | Admitting: Gastroenterology

## 2019-09-18 NOTE — Telephone Encounter (Signed)
Pt is due for a recall colon 04/15/2019.  Pt stated that he has moved to [place], [place] and that it will be difficult to arrange a chaperone for procedure.  Pt inquired whether it would be OK to postpone procedure until July when he can arrange transportation or whether Dr. Loletha Carrow can recommend another GI MD closer to his area.  He stated that "Dr. Loletha Carrow is an excellent MD but it would be hard to find a ride."    Please advise.

## 2019-09-18 NOTE — Telephone Encounter (Signed)
He was due about Nov [id number], but it can certainly wait another few months.  I am honored he would consider traveling all that way to see me for another colonoscopy and is welcome to do so.  However, I am sure there are excellent GI docs in that area.  I do not know any personally to make a recommendation, but perhaps his PCP in that area can recommend one.

## 2019-09-18 NOTE — Telephone Encounter (Signed)
Pt states he will probably wait a few months and come back to this office.

## 2019-09-18 NOTE — Telephone Encounter (Signed)
Please see note below and advise  

## 2019-10-10 DIAGNOSIS — H524 Presbyopia: Secondary | ICD-10-CM | POA: Diagnosis not present

## 2019-10-15 ENCOUNTER — Encounter: Payer: Self-pay | Admitting: Internal Medicine

## 2019-10-27 ENCOUNTER — Other Ambulatory Visit: Payer: Self-pay | Admitting: Internal Medicine

## 2019-10-27 NOTE — Telephone Encounter (Signed)
Done erx 

## 2019-11-11 ENCOUNTER — Other Ambulatory Visit: Payer: Self-pay | Admitting: Internal Medicine

## 2019-11-11 NOTE — Telephone Encounter (Signed)
Done erx 

## 2020-02-24 DIAGNOSIS — R69 Illness, unspecified: Secondary | ICD-10-CM | POA: Diagnosis not present

## 2020-05-13 ENCOUNTER — Other Ambulatory Visit: Payer: Medicare HMO

## 2020-05-20 ENCOUNTER — Other Ambulatory Visit: Payer: Self-pay

## 2020-05-20 ENCOUNTER — Ambulatory Visit: Payer: Self-pay

## 2020-05-20 ENCOUNTER — Encounter: Payer: Self-pay | Admitting: Internal Medicine

## 2020-05-20 ENCOUNTER — Ambulatory Visit (INDEPENDENT_AMBULATORY_CARE_PROVIDER_SITE_OTHER): Payer: Medicare HMO | Admitting: Internal Medicine

## 2020-05-20 ENCOUNTER — Ambulatory Visit: Payer: Medicare HMO | Admitting: Family Medicine

## 2020-05-20 ENCOUNTER — Encounter: Payer: Self-pay | Admitting: Family Medicine

## 2020-05-20 VITALS — BP 120/82 | HR 81 | Temp 98.3°F | Ht 68.0 in | Wt 190.0 lb

## 2020-05-20 VITALS — BP 132/82 | HR 73 | Ht 68.0 in | Wt 190.6 lb

## 2020-05-20 DIAGNOSIS — M24131 Other articular cartilage disorders, right wrist: Secondary | ICD-10-CM

## 2020-05-20 DIAGNOSIS — F411 Generalized anxiety disorder: Secondary | ICD-10-CM

## 2020-05-20 DIAGNOSIS — E785 Hyperlipidemia, unspecified: Secondary | ICD-10-CM | POA: Diagnosis not present

## 2020-05-20 DIAGNOSIS — M25531 Pain in right wrist: Secondary | ICD-10-CM

## 2020-05-20 DIAGNOSIS — Z0000 Encounter for general adult medical examination without abnormal findings: Secondary | ICD-10-CM | POA: Diagnosis not present

## 2020-05-20 DIAGNOSIS — M25331 Other instability, right wrist: Secondary | ICD-10-CM | POA: Diagnosis not present

## 2020-05-20 DIAGNOSIS — K219 Gastro-esophageal reflux disease without esophagitis: Secondary | ICD-10-CM

## 2020-05-20 DIAGNOSIS — R69 Illness, unspecified: Secondary | ICD-10-CM | POA: Diagnosis not present

## 2020-05-20 DIAGNOSIS — Z86010 Personal history of colonic polyps: Secondary | ICD-10-CM

## 2020-05-20 LAB — CBC WITH DIFFERENTIAL/PLATELET
Basophils Absolute: 0 10*3/uL (ref 0.0–0.1)
Basophils Relative: 0.6 % (ref 0.0–3.0)
Eosinophils Absolute: 0.1 10*3/uL (ref 0.0–0.7)
Eosinophils Relative: 1.5 % (ref 0.0–5.0)
HCT: 42.5 % (ref 39.0–52.0)
Hemoglobin: 14.5 g/dL (ref 13.0–17.0)
Lymphocytes Relative: 27.2 % (ref 12.0–46.0)
Lymphs Abs: 1.9 10*3/uL (ref 0.7–4.0)
MCHC: 34.1 g/dL (ref 30.0–36.0)
MCV: 93.1 fl (ref 78.0–100.0)
Monocytes Absolute: 0.8 10*3/uL (ref 0.1–1.0)
Monocytes Relative: 11.2 % (ref 3.0–12.0)
Neutro Abs: 4.2 10*3/uL (ref 1.4–7.7)
Neutrophils Relative %: 59.5 % (ref 43.0–77.0)
Platelets: 228 10*3/uL (ref 150.0–400.0)
RBC: 4.56 Mil/uL (ref 4.22–5.81)
RDW: 14.2 % (ref 11.5–15.5)
WBC: 7.1 10*3/uL (ref 4.0–10.5)

## 2020-05-20 LAB — BASIC METABOLIC PANEL
BUN: 14 mg/dL (ref 6–23)
CO2: 29 mEq/L (ref 19–32)
Calcium: 9.3 mg/dL (ref 8.4–10.5)
Chloride: 103 mEq/L (ref 96–112)
Creatinine, Ser: 1 mg/dL (ref 0.40–1.50)
GFR: 80.24 mL/min (ref >60.00)
Glucose, Bld: 91 mg/dL (ref 70–99)
Potassium: 4.1 mEq/L (ref 3.5–5.1)
Sodium: 139 mEq/L (ref 135–145)

## 2020-05-20 LAB — HEPATIC FUNCTION PANEL
ALT: 20 U/L (ref 0–53)
AST: 21 U/L (ref 0–37)
Albumin: 4.8 g/dL (ref 3.5–5.2)
Alkaline Phosphatase: 83 U/L (ref 39–117)
Bilirubin, Direct: 0.1 mg/dL (ref 0.0–0.3)
Total Bilirubin: 0.3 mg/dL (ref 0.2–1.2)
Total Protein: 6.9 g/dL (ref 6.0–8.3)

## 2020-05-20 LAB — LIPID PANEL
Cholesterol: 137 mg/dL (ref 0–200)
HDL: 52.1 mg/dL (ref >39.00)
LDL Cholesterol: 60 mg/dL (ref 0–99)
NonHDL: 84.58
Total CHOL/HDL Ratio: 3
Triglycerides: 122 mg/dL (ref 0.0–149.0)
VLDL: 24.4 mg/dL (ref 0.0–40.0)

## 2020-05-20 LAB — URINALYSIS, ROUTINE W REFLEX MICROSCOPIC
Bilirubin Urine: NEGATIVE
Hgb urine dipstick: NEGATIVE
Ketones, ur: NEGATIVE
Leukocytes,Ua: NEGATIVE
Nitrite: NEGATIVE
Specific Gravity, Urine: 1.025 (ref 1.000–1.030)
Total Protein, Urine: NEGATIVE
Urine Glucose: NEGATIVE
Urobilinogen, UA: 0.2 (ref 0.0–1.0)
pH: 5.5 (ref 5.0–8.0)

## 2020-05-20 LAB — TSH: TSH: 2.57 u[IU]/mL (ref 0.35–4.50)

## 2020-05-20 LAB — PSA: PSA: 0.53 ng/mL (ref 0.10–4.00)

## 2020-05-20 MED ORDER — CLONAZEPAM 0.5 MG PO TABS: 0.5000 mg | ORAL_TABLET | Freq: Two times a day (BID) | ORAL | 5 refills | Status: DC | PRN

## 2020-05-20 MED ORDER — OMEPRAZOLE 40 MG PO CPDR: DELAYED_RELEASE_CAPSULE | ORAL | 3 refills | Status: DC

## 2020-05-20 MED ORDER — ROSUVASTATIN CALCIUM 20 MG PO TABS: 20.0000 mg | ORAL_TABLET | Freq: Every day | ORAL | 3 refills | Status: DC

## 2020-05-20 NOTE — Patient Instructions (Signed)

## 2020-05-20 NOTE — Patient Instructions (Signed)
Thank you for coming in today.  Please use voltaren gel up to 4x daily for pain as needed.   I recommend you obtained a compression sleeve to help with your joint problems. There are many options on the market however I recommend obtaining a full wrist Body Helix compression sleeve.  You can find information (including how to appropriate measure yourself for sizing) can be found at [email or web address].ch.  Many of these products are health savings account (HSA) eligible.   You can use the compression sleeve at any time throughout the day but is most important to use while being active as well as for 2 hours post-activity.   It is appropriate to ice following activity with the compression sleeve in place.  Formal hand PT will likely help.

## 2020-05-20 NOTE — Progress Notes (Signed)
Established Patient Office Visit  Subjective:  Patient ID: Peter [name], male    DOB: [birth year]  Age: 65 y.o. MRN: [id number]        Chief Complaint: (concise statement describing the symptom, problem, condition, diagnosis, physician recommended return, or other factor as reason for encounter): wellness and No chief complaint on file.       HPI:  Peter [name] is a 65 y.o. male here for wellness overall doing ok; Plans to get booster soon.  Will consider shingles shot soon.   Lives in [place], comes to [facility] [place] once monthly a family still here.  Retired, gained several lbs but lifting wt at the gym fairly often, tends be more active this time time of year, travels some  Has been spotty with the statins, but taken consistently over the past month.  Due for colonoscopy but would like in [place].   To see sport med later today hoping for cortisone shot to right wrist chronic djd.  Has not had to see Dr Brigid Re for hip pain and overall doing well, just a chronic aching.  Much less stress recently so no longer needs lunesta.  Otherwise    Pt denies Chest pain, worsening SOB, DOE, wheezing, orthopnea, PND, worsening LE edema, palpitations, dizziness or syncope.  Pt denies neurological change such as new headache, facial or extremity weakness.  Pt denies polydipsia, polyuria..  Pt denies worsening depressive symptoms, suicidal ideation or panic, though has ongoing signifciant anxiety   Wt Readings from Last 3 Encounters:  05/20/20 190 lb 9.6 oz (86.5 kg)  05/20/20 190 lb (86.2 kg)  08/28/19 176 lb (79.8 kg)   BP Readings from Last 3 Encounters:  05/20/20 132/82  05/20/20 120/82  08/28/19 124/88       Past Medical History:  Diagnosis Date  . Anxiety state, unspecified 12/30/2012  . Arthritis   . Depression   . GERD (gastroesophageal reflux disease)   . Migraine   . Neck pain 11/22/2014   Past Surgical History:  Procedure Laterality Date  . right hip revision  2007  . right  hip THA  2002  . TONSILLECTOMY    . 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 tumor removed      reports that he has never smoked. He has never used smokeless tobacco. He reports current alcohol use. He reports current drug use. Drug: Marijuana. family history includes Alcoholism in an other family member; Arthritis in an other family member; Colon polyps in his sister; Heart disease in an other family member; Hypertension in an other family member; Lung cancer in an other family member; Stroke in an other family member. Allergies  Allergen Reactions  . Lunesta [Eszopiclone] Anaphylaxis and Other (See Comments)    Metal taste in mouth, lungs filled with fluid  . Demerol [Meperidine] Other (See Comments)    hallucinations  . Buspar [Buspirone] Other (See Comments)    Shaky, nausea   Current Outpatient Medications on File Prior to Visit  Medication Sig Dispense Refill  . Acetaminophen (TYLENOL 8 HOUR PO) Take by mouth.    Marland Kitchen aspirin EC 81 MG tablet Take 81 mg by mouth daily.    . Multiple Vitamin (MULTIVITAMIN WITH MINERALS) TABS tablet Take 1 tablet by mouth daily.    . Omega-3 Krill Oil 300 MG CAPS Take 300 mg by  mouth daily.     No current facility-administered medications on file prior to visit.        ROS:  All others reviewed and negative.  Objective        PE:  BP 120/82 (BP Location: Left Arm, Patient Position: Sitting, Cuff Size: Large)   Pulse 81   Temp 98.3 F (36.8 C) (Oral)   Ht 5\' 8"  (1.727 m)   Wt 190 lb (86.2 kg)   SpO2 98%   BMI 28.89 kg/m                 Constitutional: Pt appears in NAD               HENT: Head: NCAT.                Right Ear: External ear normal.                 Left Ear: External ear normal.                Eyes: . Pupils are equal, round, and reactive to light. Conjunctivae and EOM are normal                Nose: without d/c or deformity               Neck: Neck supple. Gross normal ROM               Cardiovascular: Normal rate and regular rhythm.                 Pulmonary/Chest: Effort normal and breath sounds without rales or wheezing.                Abd:  Soft, NT, ND, + BS, no organomegaly               Neurological: Pt is alert. At baseline orientation, motor grossly intact               Skin: Skin is warm. No rashes, no other new lesions, LE edema - none               Psychiatric: Pt behavior is normal without agitation   Assessment/Plan:  Peter [name] is a 65 y.o. Other or two or more races [6] male with  has a past medical history of Anxiety state, unspecified (12/30/2012), Arthritis, Depression, GERD (gastroesophageal reflux disease), Migraine, and Neck pain (11/22/2014).  Assessment Plan  See notes Labs reviewed for each problem: Lab Results  Component Value Date   WBC 7.1 05/20/2020   HGB 14.5 05/20/2020   HCT 42.5 05/20/2020   PLT 228.0 05/20/2020   GLUCOSE 91 05/20/2020   CHOL 137 05/20/2020   TRIG 122.0 05/20/2020   HDL 52.10 05/20/2020   LDLDIRECT 146.0 02/10/2016   LDLCALC 60 05/20/2020   ALT 20 05/20/2020   AST 21 05/20/2020   NA 139 05/20/2020   K 4.1 05/20/2020   CL 103 05/20/2020   CREATININE 1.00 05/20/2020   BUN 14 05/20/2020   CO2 29 05/20/2020   TSH 2.57 05/20/2020   PSA 0.53 05/20/2020   INR 0.96 02/17/2016   HGBA1C 5.8 05/19/2019    Micro: none  Cardiac tracings I have personally interpreted today:  none  Pertinent Radiological findings (summarize): none    Health Maintenance Due  Topic Date Due  . COLONOSCOPY (Pts 45-10yrs Insurance coverage will need to be confirmed)  04/05/2019    There are no preventive  care reminders to display for this patient.  Lab Results  Component Value Date   TSH 2.57 05/20/2020   Lab Results  Component Value Date   WBC 7.1 05/20/2020   HGB 14.5 05/20/2020   HCT 42.5 05/20/2020   MCV 93.1  05/20/2020   PLT 228.0 05/20/2020   Lab Results  Component Value Date   NA 139 05/20/2020   K 4.1 05/20/2020   CO2 29 05/20/2020   GLUCOSE 91 05/20/2020   BUN 14 05/20/2020   CREATININE 1.00 05/20/2020   BILITOT 0.3 05/20/2020   ALKPHOS 83 05/20/2020   AST 21 05/20/2020   ALT 20 05/20/2020   PROT 6.9 05/20/2020   ALBUMIN 4.8 05/20/2020   CALCIUM 9.3 05/20/2020   ANIONGAP 3 (L) 02/29/2016   GFR 80.24 05/20/2020   Lab Results  Component Value Date   CHOL 137 05/20/2020   Lab Results  Component Value Date   HDL 52.10 05/20/2020   Lab Results  Component Value Date   LDLCALC 60 05/20/2020   Lab Results  Component Value Date   TRIG 122.0 05/20/2020   Lab Results  Component Value Date   CHOLHDL 3 05/20/2020   Lab Results  Component Value Date   HGBA1C 5.8 05/19/2019      Assessment & Plan:   Problem List Items Addressed This Visit      High   Preventative health care    Overall doing well, age appropriate education and counseling updated, referrals for preventative services and immunizations addressed, dietary and smoking counseling addressed, most recent labs reviewed.  I have personally reviewed and have noted:  1) the patient's medical and social history 2) The pt's use of alcohol, tobacco, and illicit drugs 3) The patient's current medications and supplements 4) Functional ability including ADL's, fall risk, home safety risk, hearing and visual impairment 5) Diet and physical activities 6) Evidence for depression or mood disorder 7) The patient's height, weight, and BMI have been recorded in the chart  I have made referrals, and provided counseling and education based on review of the above       Relevant Orders   PSA (Completed)     Medium   Hyperlipidemia    Lab Results  Component Value Date   LDLCALC 60 05/20/2020   Stable, pt to continue current statin crestor 20       Relevant Medications   rosuvastatin (CRESTOR) 20 MG tablet   Other  Relevant Orders   Lipid panel (Completed)   Hepatic function panel (Completed)   CBC with Differential/Platelet (Completed)   TSH (Completed)   Urinalysis, Routine w reflex microscopic (Completed)   Basic metabolic panel (Completed)   GERD (gastroesophageal reflux disease)    Stable, cont same tx      Relevant Medications   omeprazole (PRILOSEC) 40 MG capsule   Anxiety state    Stable, cont same tx- klonopin       Other Visit Diagnoses    History of colon polyps    -  Primary   Relevant Orders   Ambulatory referral to Gastroenterology      Meds ordered this encounter  Medications  . rosuvastatin (CRESTOR) 20 MG tablet    Sig: Take 1 tablet (20 mg total) by mouth daily.    Dispense:  90 tablet    Refill:  3  . omeprazole (PRILOSEC) 40 MG capsule    Sig: TAKE 1 CAPSULE BY MOUTH EVERY DAY    Dispense:  90 capsule  Refill:  3  . clonazePAM (KLONOPIN) 0.5 MG tablet    Sig: Take 1 tablet (0.5 mg total) by mouth 2 (two) times daily as needed.    Dispense:  60 tablet    Refill:  5    This request is for a new prescription for a controlled substance as required by Federal/State law..    Follow-up: Return in about 1 year (around 05/20/2021).     Cathlean Cower, MD 05/20/2020 10:48 AM [facility]

## 2020-05-20 NOTE — Progress Notes (Signed)
I, Christoper Fabian, LAT, ATC, am serving as scribe for Dr. Clementeen Graham.  Peter [name] is a 65 y.o. male who presents to [facility] today for f/u of chronic R wrist pain and desires to have a R wrist injection.  He was last seen by Dr. Katrinka Blazing on 08/28/19 and had a R TFCC injection.  He was advised to take DHEA 50 mg and glutamine 5 mg and has been provided a wrist brace.  Since his last visit w/ Dr. Katrinka Blazing, pt reports that his R wrist pain has been flared up over the past 6 weeks.  He locates his pain to his R dorsal wrist and notes the most pain w/ R wrist radial deviation w/ ext.  He is no longer wearing a wrist brace.  As noted above pain is mostly dorsal wrist.  Dysfunction discomfort after riding a bicycle.  Unable to do push-ups in normal position because of wrist pain.  Not much ulnar-sided wrist pain.  Diagnostic testing: R wrist XR- 07/29/18  Pertinent review of systems: No fevers or chills  Relevant historical information: Polymyalgia.  History right TFCC tear.  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   Exam:  BP 132/82 (BP Location: Left Arm, Patient Position: Sitting, Cuff Size: Normal)   Pulse 73   Ht 5\' 8"  (1.727 m)   Wt 190 lb 9.6 oz (86.5 kg)   SpO2 96%   BMI 28.98 kg/m  General: Well Developed, well nourished, and in no acute distress.   MSK: Right wrist slight swollen otherwise normal-appearing Normal motion.  Tender palpation mildly dorsal wrist.  Nontender ulnar wrist. Intact strength.    Lab and Radiology Results  Procedure: Real-time Ultrasound Guided Injection of right wrist dorsal aspect radiocarpal joint Device: Philips Affiniti 50G Images permanently stored and available for review in PACS Verbal informed consent obtained.  Discussed risks and benefits of procedure. Warned about infection bleeding damage to structures skin hypopigmentation and fat atrophy among others. Patient expresses understanding and  agreement Time-out conducted.   Noted no overlying erythema, induration, or other signs of local infection.   Skin prepped in a sterile fashion.   Local anesthesia: Topical Ethyl chloride.   With sterile technique and under real time ultrasound guidance:  40 mg of Kenalog and 1 mL of lidocaine injected into the wrist. Fluid seen entering the joint capsule.   Completed without difficulty   Pain immediately resolved suggesting accurate placement of the medication.   Advised to call if fevers/chills, erythema, induration, drainage, or persistent bleeding.   Images permanently stored and available for review in the ultrasound unit.  Impression: Technically successful ultrasound guided injection.   EXAM: RIGHT WRIST - COMPLETE 3+ VIEW  COMPARISON:  None.  FINDINGS: There is no evidence of fracture or dislocation. There is no evidence of arthropathy or other focal bone abnormality. Soft tissues are unremarkable.  IMPRESSION: Negative.   Electronically Signed   By: M.D.   On: 07/30/2018 08:28 I, 08/01/2018, personally (independently) visualized and performed the interpretation of the images attached in this note.  Degenerative changes present.     Assessment and Plan: 65 y.o. male with right wrist pain due to degeneration.  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  He has had several wrist injections.  I think it is reasonable to repeat injection today as his most recent one was in April 2021.  However discussed some  other additional treatment options or further diagnostic testing that could be done in the future.  Recommend proceeding with hand PT especially if his wrist pain returns.  Also recommend considering an MRI arthrogram of the wrist to further evaluate his cause of wrist pain.  Additionally recommend Voltaren gel and compressive wrist sleeve during activity.  Recheck with Dr. Tamala Julian or  myself as needed.   PDMP not reviewed this encounter. Orders Placed This Encounter  Procedures  . [place] LIMITED JOINT SPACE STRUCTURES UP RIGHT(NO LINKED CHARGES)    Order Specific Question:   Reason for Exam (SYMPTOM  OR DIAGNOSIS REQUIRED)    Answer:   R wrist pain    Order Specific Question:   Preferred imaging location?    Answer:   [facility]   No orders of the defined types were placed in this encounter.    Discussed warning signs or symptoms. Please see discharge instructions. Patient expresses understanding.   The above documentation has been reviewed and is accurate and complete Lynne Leader, M.D.

## 2020-05-21 ENCOUNTER — Telehealth: Payer: Self-pay | Admitting: Internal Medicine

## 2020-05-21 NOTE — Telephone Encounter (Signed)
Cholesterol level dropped significantly from last year, is it natural? Been on a statin for a year and half,  Please call [phone]

## 2020-05-27 NOTE — Telephone Encounter (Signed)
I talked with pt regarding a referral and he asked about this message.  Can you please call pt

## 2020-05-28 NOTE — Telephone Encounter (Signed)
Cholesterol is perfect and expected  No need for any tx change

## 2020-05-28 NOTE — Telephone Encounter (Signed)
Pt notified of Dr Gwynn Burly response & verb understanding.

## 2020-05-31 ENCOUNTER — Encounter: Payer: Self-pay | Admitting: Internal Medicine

## 2020-05-31 NOTE — Assessment & Plan Note (Signed)
Stable, cont same tx 

## 2020-05-31 NOTE — Assessment & Plan Note (Signed)

## 2020-05-31 NOTE — Assessment & Plan Note (Signed)
Lab Results  Component Value Date   LDLCALC 60 05/20/2020   Stable, pt to continue current statin crestor 20

## 2020-05-31 NOTE — Assessment & Plan Note (Signed)
Stable, cont same tx- klonopin

## 2020-06-03 ENCOUNTER — Encounter: Payer: Self-pay | Admitting: Family Medicine

## 2020-06-07 ENCOUNTER — Telehealth: Payer: Self-pay | Admitting: Internal Medicine

## 2020-06-07 MED ORDER — ROSUVASTATIN CALCIUM 20 MG PO TABS: 20.0000 mg | ORAL_TABLET | Freq: Every day | ORAL | 3 refills | Status: DC

## 2020-06-07 NOTE — Telephone Encounter (Signed)
Peter [name] from [facility] calling to get clarification about the  rosuvastatin (CRESTOR) 20 MG tablet. Patient states he was told to only take it every other day, but it is written every day. Peter [name] states if it is supposed to be every other day she needs to send to the pharmacy so the patient wont be marked at being late for refill. [phone]

## 2020-07-27 DIAGNOSIS — D2272 Melanocytic nevi of left lower limb, including hip: Secondary | ICD-10-CM | POA: Diagnosis not present

## 2020-07-27 DIAGNOSIS — L814 Other melanin hyperpigmentation: Secondary | ICD-10-CM | POA: Diagnosis not present

## 2020-07-27 DIAGNOSIS — D225 Melanocytic nevi of trunk: Secondary | ICD-10-CM | POA: Diagnosis not present

## 2020-07-27 DIAGNOSIS — L918 Other hypertrophic disorders of the skin: Secondary | ICD-10-CM | POA: Diagnosis not present

## 2020-07-27 DIAGNOSIS — D2239 Melanocytic nevi of other parts of face: Secondary | ICD-10-CM | POA: Diagnosis not present

## 2020-07-27 DIAGNOSIS — L821 Other seborrheic keratosis: Secondary | ICD-10-CM | POA: Diagnosis not present

## 2020-07-27 DIAGNOSIS — L570 Actinic keratosis: Secondary | ICD-10-CM | POA: Diagnosis not present

## 2020-07-27 DIAGNOSIS — R229 Localized swelling, mass and lump, unspecified: Secondary | ICD-10-CM | POA: Diagnosis not present

## 2020-07-27 DIAGNOSIS — S80811A Abrasion, right lower leg, initial encounter: Secondary | ICD-10-CM | POA: Diagnosis not present

## 2020-07-27 DIAGNOSIS — D2271 Melanocytic nevi of right lower limb, including hip: Secondary | ICD-10-CM | POA: Diagnosis not present

## 2020-07-27 DIAGNOSIS — L568 Other specified acute skin changes due to ultraviolet radiation: Secondary | ICD-10-CM | POA: Diagnosis not present

## 2020-09-17 ENCOUNTER — Telehealth: Payer: Self-pay | Admitting: Internal Medicine

## 2020-09-17 DIAGNOSIS — K219 Gastro-esophageal reflux disease without esophagitis: Secondary | ICD-10-CM

## 2020-09-17 DIAGNOSIS — Z86010 Personal history of colonic polyps: Secondary | ICD-10-CM

## 2020-09-17 NOTE — Telephone Encounter (Signed)
Patient called and is requesting a referral to GI in [place]. Please advise

## 2020-09-18 NOTE — Telephone Encounter (Signed)
Referral done

## 2020-10-12 DIAGNOSIS — Z860100 Personal history of colon polyps, unspecified: Secondary | ICD-10-CM | POA: Insufficient documentation

## 2020-10-12 DIAGNOSIS — K219 Gastro-esophageal reflux disease without esophagitis: Secondary | ICD-10-CM | POA: Diagnosis not present

## 2020-11-17 ENCOUNTER — Other Ambulatory Visit: Payer: Self-pay | Admitting: Internal Medicine

## 2020-12-07 DIAGNOSIS — K5000 Crohn's disease of small intestine without complications: Secondary | ICD-10-CM | POA: Diagnosis not present

## 2020-12-07 DIAGNOSIS — K649 Unspecified hemorrhoids: Secondary | ICD-10-CM | POA: Diagnosis not present

## 2020-12-07 DIAGNOSIS — K635 Polyp of colon: Secondary | ICD-10-CM | POA: Diagnosis not present

## 2020-12-07 DIAGNOSIS — K529 Noninfective gastroenteritis and colitis, unspecified: Secondary | ICD-10-CM | POA: Diagnosis not present

## 2020-12-07 DIAGNOSIS — K2950 Unspecified chronic gastritis without bleeding: Secondary | ICD-10-CM | POA: Diagnosis not present

## 2020-12-07 DIAGNOSIS — K2960 Other gastritis without bleeding: Secondary | ICD-10-CM | POA: Diagnosis not present

## 2020-12-07 DIAGNOSIS — K209 Esophagitis, unspecified without bleeding: Secondary | ICD-10-CM | POA: Diagnosis not present

## 2020-12-07 DIAGNOSIS — Z79899 Other long term (current) drug therapy: Secondary | ICD-10-CM | POA: Diagnosis not present

## 2020-12-07 DIAGNOSIS — K2100 Gastro-esophageal reflux disease with esophagitis, without bleeding: Secondary | ICD-10-CM | POA: Diagnosis not present

## 2020-12-07 DIAGNOSIS — K5730 Diverticulosis of large intestine without perforation or abscess without bleeding: Secondary | ICD-10-CM | POA: Diagnosis not present

## 2020-12-07 DIAGNOSIS — Z09 Encounter for follow-up examination after completed treatment for conditions other than malignant neoplasm: Secondary | ICD-10-CM | POA: Diagnosis not present

## 2020-12-07 DIAGNOSIS — Z86010 Personal history of colonic polyps: Secondary | ICD-10-CM | POA: Diagnosis not present

## 2020-12-07 DIAGNOSIS — K449 Diaphragmatic hernia without obstruction or gangrene: Secondary | ICD-10-CM | POA: Diagnosis not present

## 2020-12-07 DIAGNOSIS — K5289 Other specified noninfective gastroenteritis and colitis: Secondary | ICD-10-CM | POA: Diagnosis not present

## 2020-12-07 DIAGNOSIS — Z7982 Long term (current) use of aspirin: Secondary | ICD-10-CM | POA: Diagnosis not present

## 2020-12-07 DIAGNOSIS — Z1211 Encounter for screening for malignant neoplasm of colon: Secondary | ICD-10-CM | POA: Diagnosis not present

## 2020-12-07 DIAGNOSIS — K219 Gastro-esophageal reflux disease without esophagitis: Secondary | ICD-10-CM | POA: Diagnosis not present

## 2020-12-07 DIAGNOSIS — K641 Second degree hemorrhoids: Secondary | ICD-10-CM | POA: Diagnosis not present

## 2021-04-20 ENCOUNTER — Other Ambulatory Visit: Payer: Self-pay | Admitting: Internal Medicine

## 2021-04-20 NOTE — Telephone Encounter (Signed)
Please refill as per office routine med refill policy (all routine meds to be refilled for 3 mo or monthly (per pt preference) up to one year from last visit, then month to month grace period for 3 mo, then further med refills will have to be denied) ? ?

## 2021-05-12 ENCOUNTER — Other Ambulatory Visit: Payer: Self-pay | Admitting: Internal Medicine

## 2021-05-12 NOTE — Telephone Encounter (Signed)
Please refill as per office routine med refill policy (all routine meds to be refilled for 3 mo or monthly (per pt preference) up to one year from last visit, then month to month grace period for 3 mo, then further med refills will have to be denied) ? ?

## 2021-05-23 ENCOUNTER — Other Ambulatory Visit: Payer: Self-pay

## 2021-05-23 ENCOUNTER — Ambulatory Visit (INDEPENDENT_AMBULATORY_CARE_PROVIDER_SITE_OTHER): Payer: Medicare HMO | Admitting: Internal Medicine

## 2021-05-23 ENCOUNTER — Ambulatory Visit: Payer: Self-pay

## 2021-05-23 ENCOUNTER — Encounter: Payer: Self-pay | Admitting: Family Medicine

## 2021-05-23 ENCOUNTER — Encounter: Payer: Self-pay | Admitting: Internal Medicine

## 2021-05-23 ENCOUNTER — Ambulatory Visit (INDEPENDENT_AMBULATORY_CARE_PROVIDER_SITE_OTHER): Payer: Medicare HMO

## 2021-05-23 ENCOUNTER — Ambulatory Visit (INDEPENDENT_AMBULATORY_CARE_PROVIDER_SITE_OTHER): Payer: Medicare HMO | Admitting: Family Medicine

## 2021-05-23 VITALS — BP 122/78 | HR 73 | Ht 68.0 in | Wt 192.0 lb

## 2021-05-23 VITALS — BP 108/72 | HR 76 | Ht 68.0 in | Wt 192.2 lb

## 2021-05-23 DIAGNOSIS — R739 Hyperglycemia, unspecified: Secondary | ICD-10-CM | POA: Diagnosis not present

## 2021-05-23 DIAGNOSIS — H9193 Unspecified hearing loss, bilateral: Secondary | ICD-10-CM | POA: Insufficient documentation

## 2021-05-23 DIAGNOSIS — E559 Vitamin D deficiency, unspecified: Secondary | ICD-10-CM

## 2021-05-23 DIAGNOSIS — H6123 Impacted cerumen, bilateral: Secondary | ICD-10-CM

## 2021-05-23 DIAGNOSIS — M25531 Pain in right wrist: Secondary | ICD-10-CM

## 2021-05-23 DIAGNOSIS — M24131 Other articular cartilage disorders, right wrist: Secondary | ICD-10-CM

## 2021-05-23 DIAGNOSIS — E538 Deficiency of other specified B group vitamins: Secondary | ICD-10-CM

## 2021-05-23 DIAGNOSIS — E7800 Pure hypercholesterolemia, unspecified: Secondary | ICD-10-CM | POA: Diagnosis not present

## 2021-05-23 DIAGNOSIS — K219 Gastro-esophageal reflux disease without esophagitis: Secondary | ICD-10-CM

## 2021-05-23 DIAGNOSIS — Z0001 Encounter for general adult medical examination with abnormal findings: Secondary | ICD-10-CM

## 2021-05-23 DIAGNOSIS — H900 Conductive hearing loss, bilateral: Secondary | ICD-10-CM

## 2021-05-23 DIAGNOSIS — Z0000 Encounter for general adult medical examination without abnormal findings: Secondary | ICD-10-CM | POA: Diagnosis not present

## 2021-05-23 DIAGNOSIS — R69 Illness, unspecified: Secondary | ICD-10-CM | POA: Diagnosis not present

## 2021-05-23 DIAGNOSIS — M2550 Pain in unspecified joint: Secondary | ICD-10-CM | POA: Diagnosis not present

## 2021-05-23 DIAGNOSIS — F418 Other specified anxiety disorders: Secondary | ICD-10-CM

## 2021-05-23 LAB — URINALYSIS, ROUTINE W REFLEX MICROSCOPIC
Bilirubin Urine: NEGATIVE
Hgb urine dipstick: NEGATIVE
Ketones, ur: NEGATIVE
Leukocytes,Ua: NEGATIVE
Nitrite: NEGATIVE
RBC / HPF: NONE SEEN (ref 0–?)
Specific Gravity, Urine: 1.015 (ref 1.000–1.030)
Total Protein, Urine: NEGATIVE
Urine Glucose: NEGATIVE
Urobilinogen, UA: 0.2 (ref 0.0–1.0)
WBC, UA: NONE SEEN (ref 0–?)
pH: 7 (ref 5.0–8.0)

## 2021-05-23 LAB — BASIC METABOLIC PANEL
BUN: 13 mg/dL (ref 6–23)
CO2: 31 mEq/L (ref 19–32)
Calcium: 9.2 mg/dL (ref 8.4–10.5)
Chloride: 103 mEq/L (ref 96–112)
Creatinine, Ser: 0.89 mg/dL (ref 0.40–1.50)
GFR: 90.72 mL/min (ref >60.00)
Glucose, Bld: 86 mg/dL (ref 70–99)
Potassium: 4.6 mEq/L (ref 3.5–5.1)
Sodium: 139 mEq/L (ref 135–145)

## 2021-05-23 LAB — TSH: TSH: 3.59 u[IU]/mL (ref 0.35–5.50)

## 2021-05-23 LAB — HEPATIC FUNCTION PANEL
ALT: 27 U/L (ref 0–53)
AST: 23 U/L (ref 0–37)
Albumin: 4.5 g/dL (ref 3.5–5.2)
Alkaline Phosphatase: 75 U/L (ref 39–117)
Bilirubin, Direct: 0.1 mg/dL (ref 0.0–0.3)
Total Bilirubin: 0.3 mg/dL (ref 0.2–1.2)
Total Protein: 6.6 g/dL (ref 6.0–8.3)

## 2021-05-23 LAB — CBC WITH DIFFERENTIAL/PLATELET
Basophils Absolute: 0.1 10*3/uL (ref 0.0–0.1)
Basophils Relative: 0.8 % (ref 0.0–3.0)
Eosinophils Absolute: 0.1 10*3/uL (ref 0.0–0.7)
Eosinophils Relative: 1.8 % (ref 0.0–5.0)
HCT: 45.5 % (ref 39.0–52.0)
Hemoglobin: 15.2 g/dL (ref 13.0–17.0)
Lymphocytes Relative: 26.7 % (ref 12.0–46.0)
Lymphs Abs: 1.7 10*3/uL (ref 0.7–4.0)
MCHC: 33.3 g/dL (ref 30.0–36.0)
MCV: 94.7 fl (ref 78.0–100.0)
Monocytes Absolute: 0.8 10*3/uL (ref 0.1–1.0)
Monocytes Relative: 12.4 % — ABNORMAL HIGH (ref 3.0–12.0)
Neutro Abs: 3.6 10*3/uL (ref 1.4–7.7)
Neutrophils Relative %: 58.3 % (ref 43.0–77.0)
Platelets: 218 10*3/uL (ref 150.0–400.0)
RBC: 4.81 Mil/uL (ref 4.22–5.81)
RDW: 13.9 % (ref 11.5–15.5)
WBC: 6.3 10*3/uL (ref 4.0–10.5)

## 2021-05-23 LAB — VITAMIN D 25 HYDROXY (VIT D DEFICIENCY, FRACTURES): VITD: 24.28 ng/mL — ABNORMAL LOW (ref 30.00–100.00)

## 2021-05-23 LAB — VITAMIN B12: Vitamin B-12: 415 pg/mL (ref 211–911)

## 2021-05-23 LAB — HEMOGLOBIN A1C: Hgb A1c MFr Bld: 6 % (ref 4.6–6.5)

## 2021-05-23 LAB — LDL CHOLESTEROL, DIRECT: Direct LDL: 88 mg/dL

## 2021-05-23 LAB — LIPID PANEL
Cholesterol: 162 mg/dL (ref 0–200)
HDL: 52.7 mg/dL (ref >39.00)
NonHDL: 109.51
Total CHOL/HDL Ratio: 3
Triglycerides: 279 mg/dL — ABNORMAL HIGH (ref 0.0–149.0)
VLDL: 55.8 mg/dL — ABNORMAL HIGH (ref 0.0–40.0)

## 2021-05-23 LAB — PSA: PSA: 0.5 ng/mL (ref 0.10–4.00)

## 2021-05-23 NOTE — Patient Instructions (Signed)
Your ears were irrigated of wax impactions today  Please continue all other medications as before, and refills have been done if requested.  Please have the pharmacy call with any other refills you may need.  Please continue your efforts at being more active, low cholesterol diet, and weight control.  You are otherwise up to date with prevention measures today.  Please keep your appointments with your specialists as you may have planned  Please go to the LAB at the blood drawing area for the tests to be done  You will be contacted by phone if any changes need to be made immediately.  Otherwise, you will receive a letter about your results with an explanation, but please check with MyChart first.  Please remember to sign up for MyChart if you have not done so, as this will be important to you in the future with finding out test results, communicating by private email, and scheduling acute appointments online when needed.  Please make an Appointment to return for your 1 year visit, or sooner if needed, with Lab testing by Appointment as well, to be done about 3-5 days before at the [facility] (so this is for TWO appointments - please see the scheduling desk as you leave)   Due to the ongoing Covid 19 pandemic, our lab now requires an appointment for any labs done at our office.  If you need labs done and do not have an appointment, please call our office ahead of time to schedule before presenting to the lab for your testing.

## 2021-05-23 NOTE — Progress Notes (Signed)
Patient consent obtained. Irrigation with water and peroxide performed on both ear. Full view of tympanic membranes after procedure.  Patient tolerated procedure well.

## 2021-05-23 NOTE — Assessment & Plan Note (Signed)
Unfortunately should avoid nsaids due to recent gastritis - for tylenol prn

## 2021-05-23 NOTE — Progress Notes (Signed)
Patient ID: Peter [name], male   DOB: July 08, 1955, 66 y.o.   MRN: [id number]         Chief Complaint:: wellness exam and bilateral hearing loss, polyarthralgia, anxiety/depression, hld, gerd       HPI:  Peter [name] is a 66 y.o. male here for wellness exam; declines covid booster, shingirx, pneumovax o/w up to date                        Also has occcasional migraine and occasional diffuse joint pains, but acitve with biking, walking, and shoots basketball, lifts wts.   Has ongoing chronic polyarthralgias.  Had some gastritis with EGD and colonsocpy per [facility] in July 2022, asked to stop ASA and is non nsaid candidate.  Also c/o Bilat hearing loss in the past wk, without  HA, fever, ear pain, tinnitus or vertigo, thinks there may be wax involved again. Has been much more active again with lifting wts, but laments unable to lose overall body wt though arms bigger and waist smaller.  No wt gain with regular exercise, has less body fat but more muscle mass with wt lifting.  Pt denies chest pain, increased sob or doe, wheezing, orthopnea, PND, increased LE swelling, palpitations, dizziness or syncope.   Pt denies polydipsia, polyuria, or new focal neuro s/s.   Pt denies fever, wt loss, night sweats, loss of appetite, or other constitutional symptoms  Denies worsening reflux, abd pain, dysphagia, n/v, bowel change or blood.  Wt Readings from Last 3 Encounters:  05/23/21 192 lb 3.2 oz (87.2 kg)  05/23/21 192 lb (87.1 kg)  05/20/20 190 lb 9.6 oz (86.5 kg)   BP Readings from Last 3 Encounters:  05/23/21 108/72  05/23/21 122/78  05/20/20 132/82   Immunization History  Administered Date(s) Administered   Influenza,inj,Quad PF,6+ Mos 03/05/2018, 02/15/2021   Influenza-Unspecified 02/20/2016, 03/15/2017, 02/12/2018, 02/19/2019, 02/09/2020   PFIZER(Purple Top)SARS-COV-2 Vaccination 09/10/2019, 10/01/2019   Tdap 03/10/2016   There are no preventive care reminders to display for this patient.      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 He has never used smokeless tobacco. He reports current alcohol use. He reports current drug use. Drug: Marijuana. family history includes Alcoholism in an other family member; Arthritis in an other family member; Colon polyps in his sister; Heart disease in an other family member; Hypertension in an other family member; Lung cancer in an other family member; Stroke in an other family member. Allergies  Allergen Reactions   Lunesta [Eszopiclone] Anaphylaxis and Other (See Comments)    Metal taste in mouth, lungs filled with fluid   Demerol [Meperidine] Other (See Comments)    hallucinations   Buspar [Buspirone] Other (See Comments)    Shaky, nausea   Current Outpatient Medications on File Prior to Visit  Medication Sig Dispense Refill   Acetaminophen (TYLENOL 8 HOUR  PO) Take by mouth.     clonazePAM (KLONOPIN) 0.5 MG tablet TAKE 1 TABLET BY MOUTH 2 TIMES DAILY AS NEEDED. 60 tablet 5   Multiple Vitamin (MULTIVITAMIN WITH MINERALS) TABS tablet Take 1 tablet by mouth daily.     Omega-3 Krill Oil 300 MG CAPS Take 300 mg by mouth daily.     omeprazole (PRILOSEC) 40 MG capsule TAKE 1 CAPSULE BY MOUTH EVERY DAY 30 capsule 0   rosuvastatin (CRESTOR) 20 MG tablet Take 1 tablet (20 mg total) by mouth daily. 45 tablet 3   No current facility-administered medications on file prior to visit.        ROS:  All others reviewed and negative.  Objective         PE:  BP 122/78 (BP Location: Right Arm, Patient Position: Sitting, Cuff Size: Large)    Pulse 73    Ht 5\' 8"  (1.727 m)    Wt 192 lb (87.1 kg)    SpO2 97%    BMI 29.19 kg/m                 Constitutional: Pt appears in NAD               HENT: Head: NCAT.                Right Ear: External ear normal.                 Left Ear: External ear normal.                Eyes: . Pupils are equal, round, and reactive to light. Conjunctivae and EOM are normal               Nose: without d/c or deformity               Neck: Neck supple. Gross normal ROM               Cardiovascular: Normal rate and regular rhythm.                 Pulmonary/Chest: Effort normal and breath sounds without rales or wheezing.                Abd:  Soft, NT, ND, + BS, no organomegaly               Neurological: Pt is alert. At baseline orientation, motor grossly intact               Skin: Skin is warm. No rashes, no other new lesions, LE edema - none               Psychiatric: Pt behavior is normal without agitation   Micro: none  Cardiac tracings I have personally interpreted today:  none  Pertinent Radiological findings (summarize): none   Lab Results  Component Value Date   WBC 6.3 05/23/2021   HGB 15.2 05/23/2021   HCT 45.5 05/23/2021   PLT 218.0 05/23/2021   GLUCOSE 86 05/23/2021   CHOL 162 05/23/2021   TRIG 279.0 (H) 05/23/2021   HDL 52.70 05/23/2021   LDLDIRECT 88.0 05/23/2021   LDLCALC 60 05/20/2020   ALT 27 05/23/2021   AST 23 05/23/2021   NA 139 05/23/2021   K 4.6 05/23/2021   CL 103 05/23/2021   CREATININE 0.89 05/23/2021   BUN 13 05/23/2021   CO2 31 05/23/2021   TSH 3.59 05/23/2021   PSA 0.50 05/23/2021   INR 0.96  02/17/2016   HGBA1C 6.0 05/23/2021   Assessment/Plan:  Peter [name] is a 66 y.o. Other or two or more races [6] male with  has a past medical history of Anxiety state, unspecified (12/30/2012), Arthritis, Depression, GERD (gastroesophageal reflux disease), Migraine, and Neck pain  (11/22/2014).  Hyperlipidemia Lab Results  Component Value Date   LDLCALC 60 05/20/2020   Stable, pt to continue current statin crestor, declines Ct Cardiac score for now   Polyarthralgia Unfortunately should avoid nsaids due to recent gastritis - for tylenol prn  Encounter for well adult exam with abnormal findings Age and sex appropriate education and counseling updated with regular exercise and diet Referrals for preventative services - none needed Immunizations addressed - declines covid booster, shingrix, pneumovax Smoking counseling  - none needed Evidence for depression or other mood disorder - chronic anxiety no change Most recent labs reviewed. I have personally reviewed and have noted: 1) the patient's medical and social history 2) The patient's current medications and supplements 3) The patient's height, weight, and BMI have been recorded in the chart   Bilateral hearing loss Improved s/p irrigation wax impactions  Ceruminosis is noted.  Wax is removed by syringing and manual debridement. Instructions for home care to prevent wax buildup are given.   Depression with anxiety Chronic stable, cont current med tx - klonopin  GERD (gastroesophageal reflux disease) Stable, and apparently symptomatically resolved gastritis, cont prilosec  Hyperglycemia Lab Results  Component Value Date   HGBA1C 6.0 05/23/2021   Stable, pt to continue current medical treatment  - diet   Mild vitamin D deficiency Last vitamin D Lab Results  Component Value Date   VD25OH 24.28 (L) 05/23/2021   Low, to start oral replacement   Followup: Return in about 1 year (around 05/23/2022).  Cathlean Cower, MD 05/24/2021 4:58 AM [facility]

## 2021-05-23 NOTE — Patient Instructions (Addendum)
Good to see you today.  You had a R wrist injection.  Call or go to the ER if you develop a large red swollen joint with extreme pain or oozing puss.   Please get an Xray today before you leave.  Follow-up as needed.

## 2021-05-23 NOTE — Progress Notes (Signed)
I, Wendy Poet, LAT, ATC, am serving as scribe for Dr. Lynne Leader.  Peter [name] is a 66 y.o. male who presents to [place] at [facility] today for f/u of chronic R wrist pain and desires to have a R wrist injection.  He was last seen by Dr. Georgina Snell on 05/20/20 w/ dorsal R wrist pain worsened w/ R wrist radial deviation w/ ext and had a R wrist steroid injection.  He was advised to use Voltaren gel and a wrist compression sleeve/brace.  Today, pt reports that his R wrist pain con't.  He states that he is unable to do any loaded wrist ext activity.  He states that the last injection helped for approximately 11 months.  He lives in [place] but still has his medical care here in the [place] area.  Diagnostic testing: R wrist XR- 07/29/18  Pertinent review of systems: No fevers or chills  Relevant historical information: Polymyalgia.  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   MSK: Right wrist normal-appearing nontender.  Patient motion slightly limited extension.  Normal flexion.  Intact strength.    Lab and Radiology Results  Procedure: Real-time Ultrasound Guided Injection of right wrist dorsal approach Device: Philips Affiniti 50G Images permanently stored and available for review in PACS Verbal informed consent obtained.  Discussed risks and benefits of procedure. Warned about infection bleeding damage to structures skin hypopigmentation and fat atrophy among others. Patient expresses understanding and agreement Time-out conducted.   Noted no overlying erythema, induration, or other signs of local infection.   Skin prepped in a sterile fashion.   Local anesthesia: Topical Ethyl chloride.   With sterile  technique and under real time ultrasound guidance: 40 mg of Kenalog and 19ml lidocaine injected into the wrist joint. Fluid seen entering the joint capsule.   Completed without difficulty   Pain immediately resolved suggesting accurate placement of the medication.   Advised to call if fevers/chills, erythema, induration, drainage, or persistent bleeding.   Images permanently stored and available for review in the ultrasound unit.  Impression: Technically successful ultrasound guided injection.   X-ray images right wrist obtained today personally and independently interpreted No acute fractures.  No significant scapholunate spacing visible.  No severe DJD. Await formal radiology review     Assessment and Plan: 66 y.o. male with right wrist pain with some mild mechanical symptoms and lack of full extension.  This is a chronic issue that is currently well managed with some mild modification of activity and intermittent steroid injections about once a year.  This seems to be working quite well we will continue current regimen.  However if symptoms change or worsen would recommend hand PT and MRI arthrogram.  Check back as needed.  X-ray updated today.   PDMP not reviewed this encounter. Orders Placed This Encounter  Procedures   [place] LIMITED JOINT SPACE STRUCTURES UP RIGHT(NO LINKED CHARGES)    Order Specific Question:   Reason for Exam (SYMPTOM  OR DIAGNOSIS REQUIRED)    Answer:   R wrist pain    Order Specific Question:   Preferred imaging location?    Answer:   [facility] Wrist Complete Right    Standing Status:   Future    Number of Occurrences:   1    Standing Expiration Date:   06/23/2021    Order Specific Question:   Reason for Exam (SYMPTOM  OR DIAGNOSIS REQUIRED)    Answer:   R wrist pain    Order Specific Question:   Preferred imaging location?    Answer:   [facility]   No orders of the defined types were placed in this  encounter.    Discussed warning signs or symptoms. Please see discharge instructions. Patient expresses understanding.   The above documentation has been reviewed and is accurate and complete Lynne Leader, M.D.

## 2021-05-23 NOTE — Assessment & Plan Note (Signed)
Lab Results  Component Value Date   LDLCALC 60 05/20/2020   Stable, pt to continue current statin crestor, declines Ct Cardiac score for now

## 2021-05-24 ENCOUNTER — Encounter: Payer: Self-pay | Admitting: Internal Medicine

## 2021-05-24 ENCOUNTER — Other Ambulatory Visit: Payer: Self-pay | Admitting: Internal Medicine

## 2021-05-24 DIAGNOSIS — R739 Hyperglycemia, unspecified: Secondary | ICD-10-CM | POA: Insufficient documentation

## 2021-05-24 DIAGNOSIS — E559 Vitamin D deficiency, unspecified: Secondary | ICD-10-CM | POA: Insufficient documentation

## 2021-05-24 NOTE — Assessment & Plan Note (Signed)
Age and sex appropriate education and counseling updated with regular exercise and diet Referrals for preventative services - none needed Immunizations addressed - declines covid booster, shingrix, pneumovax Smoking counseling  - none needed Evidence for depression or other mood disorder - chronic anxiety no change Most recent labs reviewed. I have personally reviewed and have noted: 1) the patient's medical and social history 2) The patient's current medications and supplements 3) The patient's height, weight, and BMI have been recorded in the chart

## 2021-05-24 NOTE — Assessment & Plan Note (Signed)
Lab Results  Component Value Date   HGBA1C 6.0 05/23/2021   Stable, pt to continue current medical treatment  - diet

## 2021-05-24 NOTE — Assessment & Plan Note (Signed)
Improved s/p irrigation wax impactions  Ceruminosis is noted.  Wax is removed by syringing and manual debridement. Instructions for home care to prevent wax buildup are given. 

## 2021-05-24 NOTE — Assessment & Plan Note (Signed)
Chronic stable, cont current med tx - klonopin

## 2021-05-24 NOTE — Assessment & Plan Note (Signed)
Stable, and apparently symptomatically resolved gastritis, cont prilosec

## 2021-05-24 NOTE — Progress Notes (Signed)
Right wrist xray does show some arthritis but otherwise looks ok to radiology.

## 2021-05-24 NOTE — Assessment & Plan Note (Signed)
Last vitamin D Lab Results  Component Value Date   VD25OH 24.28 (L) 05/23/2021   Low, to start oral replacement

## 2021-06-16 ENCOUNTER — Other Ambulatory Visit: Payer: Self-pay | Admitting: Internal Medicine

## 2021-06-16 NOTE — Telephone Encounter (Signed)
Please refill as per office routine med refill policy (all routine meds to be refilled for 3 mo or monthly (per pt preference) up to one year from last visit, then month to month grace period for 3 mo, then further med refills will have to be denied) ? ?

## 2021-06-17 ENCOUNTER — Encounter: Payer: Self-pay | Admitting: Internal Medicine

## 2021-08-25 DIAGNOSIS — L821 Other seborrheic keratosis: Secondary | ICD-10-CM | POA: Diagnosis not present

## 2021-08-25 DIAGNOSIS — D485 Neoplasm of uncertain behavior of skin: Secondary | ICD-10-CM | POA: Diagnosis not present

## 2021-08-25 DIAGNOSIS — L815 Leukoderma, not elsewhere classified: Secondary | ICD-10-CM | POA: Diagnosis not present

## 2021-08-25 DIAGNOSIS — L570 Actinic keratosis: Secondary | ICD-10-CM | POA: Diagnosis not present

## 2021-08-25 DIAGNOSIS — D225 Melanocytic nevi of trunk: Secondary | ICD-10-CM | POA: Diagnosis not present

## 2021-08-25 DIAGNOSIS — L578 Other skin changes due to chronic exposure to nonionizing radiation: Secondary | ICD-10-CM | POA: Diagnosis not present

## 2021-08-25 DIAGNOSIS — L918 Other hypertrophic disorders of the skin: Secondary | ICD-10-CM | POA: Diagnosis not present

## 2021-08-25 DIAGNOSIS — L814 Other melanin hyperpigmentation: Secondary | ICD-10-CM | POA: Diagnosis not present

## 2021-08-25 DIAGNOSIS — D2239 Melanocytic nevi of other parts of face: Secondary | ICD-10-CM | POA: Diagnosis not present

## 2021-08-25 DIAGNOSIS — L905 Scar conditions and fibrosis of skin: Secondary | ICD-10-CM | POA: Diagnosis not present

## 2021-11-07 ENCOUNTER — Telehealth: Payer: Self-pay

## 2021-11-07 DIAGNOSIS — M25552 Pain in left hip: Secondary | ICD-10-CM

## 2021-11-19 ENCOUNTER — Other Ambulatory Visit: Payer: Self-pay | Admitting: Internal Medicine

## 2021-11-24 DIAGNOSIS — S76012A Strain of muscle, fascia and tendon of left hip, initial encounter: Secondary | ICD-10-CM | POA: Diagnosis not present

## 2021-11-24 DIAGNOSIS — Z96642 Presence of left artificial hip joint: Secondary | ICD-10-CM | POA: Diagnosis not present

## 2021-12-12 DIAGNOSIS — L821 Other seborrheic keratosis: Secondary | ICD-10-CM | POA: Diagnosis not present

## 2021-12-12 DIAGNOSIS — L570 Actinic keratosis: Secondary | ICD-10-CM | POA: Diagnosis not present

## 2021-12-12 DIAGNOSIS — L720 Epidermal cyst: Secondary | ICD-10-CM | POA: Diagnosis not present

## 2022-02-19 ENCOUNTER — Other Ambulatory Visit: Payer: Self-pay | Admitting: Internal Medicine

## 2022-02-20 NOTE — Telephone Encounter (Signed)
Please refill as per office routine med refill policy (all routine meds to be refilled for 3 mo or monthly (per pt preference) up to one year from last visit, then month to month grace period for 3 mo, then further med refills will have to be denied) ? ?

## 2022-03-06 ENCOUNTER — Telehealth: Payer: Self-pay | Admitting: Internal Medicine

## 2022-03-06 DIAGNOSIS — M25531 Pain in right wrist: Secondary | ICD-10-CM

## 2022-03-06 NOTE — Telephone Encounter (Signed)
Pt called stating RT wrist and pain is more frequent. Pt requesting REFERRAL to orthopedic in or around [facility].

## 2022-03-07 NOTE — Telephone Encounter (Signed)
Patient informed. 

## 2022-03-07 NOTE — Addendum Note (Signed)
Addended by: Biagio Borg on: 03/07/2022 12:16 PM   Modules accepted: Orders

## 2022-03-07 NOTE — Telephone Encounter (Signed)
Ok referral done 

## 2022-03-07 NOTE — Telephone Encounter (Signed)
Patient is requesting a referral to ortho for right wrist pain, LOV 05/23/21

## 2022-03-10 ENCOUNTER — Telehealth: Payer: Self-pay | Admitting: Internal Medicine

## 2022-03-10 NOTE — Telephone Encounter (Signed)
Last OV 05/23/2021 and Xray report dated 05/23/2021 R wrist faxed per request.

## 2022-03-10 NOTE — Telephone Encounter (Signed)
[facility] request imaging and last office visit notes and supporting documents faxed to [place] location. Also can email records to [email or web address]  Pt will be seeing the [facility] in [place]. Pt sees Gregor Hams, MD, Sports Medicine. PCP Dr. Marshall Cork.

## 2022-04-03 DIAGNOSIS — M13831 Other specified arthritis, right wrist: Secondary | ICD-10-CM | POA: Diagnosis not present

## 2022-04-03 DIAGNOSIS — M79641 Pain in right hand: Secondary | ICD-10-CM | POA: Diagnosis not present

## 2022-05-14 ENCOUNTER — Other Ambulatory Visit: Payer: Self-pay | Admitting: Internal Medicine

## 2022-05-14 NOTE — Telephone Encounter (Signed)
Please refill as per office routine med refill policy (all routine meds to be refilled for 3 mo or monthly (per pt preference) up to one year from last visit, then month to month grace period for 3 mo, then further med refills will have to be denied) ? ?

## 2022-05-25 ENCOUNTER — Ambulatory Visit (INDEPENDENT_AMBULATORY_CARE_PROVIDER_SITE_OTHER): Payer: Medicare HMO | Admitting: Internal Medicine

## 2022-05-25 VITALS — BP 122/76 | HR 77 | Temp 97.8°F | Ht 68.0 in | Wt 193.0 lb

## 2022-05-25 DIAGNOSIS — Z0001 Encounter for general adult medical examination with abnormal findings: Secondary | ICD-10-CM | POA: Diagnosis not present

## 2022-05-25 DIAGNOSIS — R739 Hyperglycemia, unspecified: Secondary | ICD-10-CM | POA: Diagnosis not present

## 2022-05-25 DIAGNOSIS — N529 Male erectile dysfunction, unspecified: Secondary | ICD-10-CM | POA: Diagnosis not present

## 2022-05-25 DIAGNOSIS — E7800 Pure hypercholesterolemia, unspecified: Secondary | ICD-10-CM

## 2022-05-25 DIAGNOSIS — E559 Vitamin D deficiency, unspecified: Secondary | ICD-10-CM

## 2022-05-25 DIAGNOSIS — F411 Generalized anxiety disorder: Secondary | ICD-10-CM

## 2022-05-25 DIAGNOSIS — R69 Illness, unspecified: Secondary | ICD-10-CM | POA: Diagnosis not present

## 2022-05-25 DIAGNOSIS — I2510 Atherosclerotic heart disease of native coronary artery without angina pectoris: Secondary | ICD-10-CM

## 2022-05-25 DIAGNOSIS — E538 Deficiency of other specified B group vitamins: Secondary | ICD-10-CM

## 2022-05-25 LAB — CBC WITH DIFFERENTIAL/PLATELET
Basophils Absolute: 0.1 10*3/uL (ref 0.0–0.1)
Basophils Relative: 0.8 % (ref 0.0–3.0)
Eosinophils Absolute: 0.1 10*3/uL (ref 0.0–0.7)
Eosinophils Relative: 2.2 % (ref 0.0–5.0)
HCT: 44.9 % (ref 39.0–52.0)
Hemoglobin: 15.3 g/dL (ref 13.0–17.0)
Lymphocytes Relative: 26 % (ref 12.0–46.0)
Lymphs Abs: 1.7 10*3/uL (ref 0.7–4.0)
MCHC: 34 g/dL (ref 30.0–36.0)
MCV: 95.8 fl (ref 78.0–100.0)
Monocytes Absolute: 0.7 10*3/uL (ref 0.1–1.0)
Monocytes Relative: 10.8 % (ref 3.0–12.0)
Neutro Abs: 3.8 10*3/uL (ref 1.4–7.7)
Neutrophils Relative %: 60.2 % (ref 43.0–77.0)
Platelets: 243 10*3/uL (ref 150.0–400.0)
RBC: 4.69 Mil/uL (ref 4.22–5.81)
RDW: 13.7 % (ref 11.5–15.5)
WBC: 6.4 10*3/uL (ref 4.0–10.5)

## 2022-05-25 LAB — URINALYSIS, ROUTINE W REFLEX MICROSCOPIC
Bilirubin Urine: NEGATIVE
Hgb urine dipstick: NEGATIVE
Ketones, ur: NEGATIVE
Leukocytes,Ua: NEGATIVE
Nitrite: NEGATIVE
Specific Gravity, Urine: 1.02 (ref 1.000–1.030)
Total Protein, Urine: NEGATIVE
Urine Glucose: NEGATIVE
Urobilinogen, UA: 0.2 (ref 0.0–1.0)
pH: 7 (ref 5.0–8.0)

## 2022-05-25 LAB — BASIC METABOLIC PANEL
BUN: 19 mg/dL (ref 6–23)
CO2: 30 mEq/L (ref 19–32)
Calcium: 9.3 mg/dL (ref 8.4–10.5)
Chloride: 105 mEq/L (ref 96–112)
Creatinine, Ser: 0.86 mg/dL (ref 0.40–1.50)
GFR: 91.02 mL/min (ref >60.00)
Glucose, Bld: 99 mg/dL (ref 70–99)
Potassium: 4.3 mEq/L (ref 3.5–5.1)
Sodium: 141 mEq/L (ref 135–145)

## 2022-05-25 LAB — HEPATIC FUNCTION PANEL
ALT: 30 U/L (ref 0–53)
AST: 25 U/L (ref 0–37)
Albumin: 4.7 g/dL (ref 3.5–5.2)
Alkaline Phosphatase: 74 U/L (ref 39–117)
Bilirubin, Direct: 0.1 mg/dL (ref 0.0–0.3)
Total Bilirubin: 0.4 mg/dL (ref 0.2–1.2)
Total Protein: 6.9 g/dL (ref 6.0–8.3)

## 2022-05-25 LAB — PSA: PSA: 0.51 ng/mL (ref 0.10–4.00)

## 2022-05-25 LAB — LIPID PANEL
Cholesterol: 170 mg/dL (ref 0–200)
HDL: 55.2 mg/dL (ref >39.00)
LDL Cholesterol: 83 mg/dL (ref 0–99)
NonHDL: 114.8
Total CHOL/HDL Ratio: 3
Triglycerides: 161 mg/dL — ABNORMAL HIGH (ref 0.0–149.0)
VLDL: 32.2 mg/dL (ref 0.0–40.0)

## 2022-05-25 LAB — TSH: TSH: 2.46 u[IU]/mL (ref 0.35–5.50)

## 2022-05-25 LAB — VITAMIN D 25 HYDROXY (VIT D DEFICIENCY, FRACTURES): VITD: 50.51 ng/mL (ref 30.00–100.00)

## 2022-05-25 LAB — VITAMIN B12: Vitamin B-12: 866 pg/mL (ref 211–911)

## 2022-05-25 LAB — HEMOGLOBIN A1C: Hgb A1c MFr Bld: 6 % (ref 4.6–6.5)

## 2022-05-25 MED ORDER — ROSUVASTATIN CALCIUM 20 MG PO TABS: 20.0000 mg | ORAL_TABLET | Freq: Every day | ORAL | 3 refills | Status: DC

## 2022-05-25 MED ORDER — CLONAZEPAM 0.5 MG PO TABS: 0.5000 mg | ORAL_TABLET | Freq: Two times a day (BID) | ORAL | 5 refills | Status: DC | PRN

## 2022-05-25 MED ORDER — TADALAFIL 20 MG PO TABS: 10.0000 mg | ORAL_TABLET | ORAL | 11 refills | Status: DC | PRN

## 2022-05-25 MED ORDER — OMEPRAZOLE 40 MG PO CPDR
40.0000 mg | DELAYED_RELEASE_CAPSULE | Freq: Every day | ORAL | 3 refills | Status: DC
Start: 2022-05-25 — End: 2023-05-18

## 2022-05-25 NOTE — Patient Instructions (Addendum)
Please have your Shingrix (shingles) shots done at your local pharmacy.  Please take all new medication as prescribed -the cialis as needed  Please continue all other medications as before, and refills have been done if requested.  Please have the pharmacy call with any other refills you may need.  Please continue your efforts at being more active, low cholesterol diet, and weight control.  You are otherwise up to date with prevention measures today.  Please keep your appointments with your specialists as you may have planned  Please go to the LAB at the blood drawing area for the tests to be done  You will be contacted by phone if any changes need to be made immediately.  Otherwise, you will receive a letter about your results with an explanation, but please check with MyChart first.  Please remember to sign up for MyChart if you have not done so, as this will be important to you in the future with finding out test results, communicating by private email, and scheduling acute appointments online when needed.  Please make an Appointment to return for your 1 year visit, or sooner if needed

## 2022-05-25 NOTE — Assessment & Plan Note (Signed)
Last vitamin D Lab Results  Component Value Date   VD25OH 24.28 (L) 05/23/2021   Low, reminded to start oral replacement
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
## 2022-05-28 ENCOUNTER — Encounter: Payer: Self-pay | Admitting: Internal Medicine

## 2022-05-28 DIAGNOSIS — I2510 Atherosclerotic heart disease of native coronary artery without angina pectoris: Secondary | ICD-10-CM | POA: Insufficient documentation

## 2022-05-28 NOTE — Assessment & Plan Note (Signed)
Lab Results  Component Value Date   HGBA1C 6.0 05/25/2022   Stable, pt to continue current medical treatment  - diet, wt control

## 2022-05-28 NOTE — Assessment & Plan Note (Signed)
Age and sex appropriate education and counseling updated with regular exercise and diet Referrals for preventative services - none needed Immunizations addressed - for shingrix at pharmacy, decliens pneumovax and flu shot and covid booster Smoking counseling  - none needed Evidence for depression or other mood disorder - chronic anxiety stable Most recent labs reviewed. I have personally reviewed and have noted: 1) the patient's medical and social history 2) The patient's current medications and supplements 3) The patient's height, weight, and BMI have been recorded in the chart

## 2022-05-28 NOTE — Assessment & Plan Note (Signed)
Chronic stable, cont current tx, no need referral at this time

## 2022-05-28 NOTE — Assessment & Plan Note (Signed)
Ok for cialis 20 mg prn

## 2022-05-28 NOTE — Assessment & Plan Note (Signed)
Pt to cont low chol diet, crestor 20 mg, and consider card CT score testing

## 2022-05-28 NOTE — Assessment & Plan Note (Signed)
Lab Results  Component Value Date   LDLCALC 83 05/25/2022   Uncontrolled, goal ldl < 70, pt to continue current crestor 20 mg qd and low chol diet, declines change for now

## 2022-06-27 DIAGNOSIS — D225 Melanocytic nevi of trunk: Secondary | ICD-10-CM | POA: Diagnosis not present

## 2022-06-27 DIAGNOSIS — D2261 Melanocytic nevi of right upper limb, including shoulder: Secondary | ICD-10-CM | POA: Diagnosis not present

## 2022-06-27 DIAGNOSIS — L570 Actinic keratosis: Secondary | ICD-10-CM | POA: Diagnosis not present

## 2022-06-27 DIAGNOSIS — L218 Other seborrheic dermatitis: Secondary | ICD-10-CM | POA: Diagnosis not present

## 2022-06-27 DIAGNOSIS — L814 Other melanin hyperpigmentation: Secondary | ICD-10-CM | POA: Diagnosis not present

## 2022-06-27 DIAGNOSIS — L578 Other skin changes due to chronic exposure to nonionizing radiation: Secondary | ICD-10-CM | POA: Diagnosis not present

## 2022-06-27 DIAGNOSIS — L821 Other seborrheic keratosis: Secondary | ICD-10-CM | POA: Diagnosis not present

## 2022-07-03 DIAGNOSIS — M13831 Other specified arthritis, right wrist: Secondary | ICD-10-CM | POA: Diagnosis not present

## 2022-07-03 DIAGNOSIS — G8918 Other acute postprocedural pain: Secondary | ICD-10-CM | POA: Diagnosis not present

## 2022-07-03 DIAGNOSIS — M79641 Pain in right hand: Secondary | ICD-10-CM | POA: Diagnosis not present

## 2022-07-03 DIAGNOSIS — Z96643 Presence of artificial hip joint, bilateral: Secondary | ICD-10-CM | POA: Diagnosis not present

## 2022-07-12 DIAGNOSIS — G5601 Carpal tunnel syndrome, right upper limb: Secondary | ICD-10-CM | POA: Diagnosis not present

## 2022-07-12 DIAGNOSIS — E7800 Pure hypercholesterolemia, unspecified: Secondary | ICD-10-CM | POA: Diagnosis not present

## 2022-07-12 DIAGNOSIS — M13831 Other specified arthritis, right wrist: Secondary | ICD-10-CM | POA: Diagnosis not present

## 2022-07-12 DIAGNOSIS — M25531 Pain in right wrist: Secondary | ICD-10-CM | POA: Diagnosis not present

## 2022-07-12 DIAGNOSIS — Z96643 Presence of artificial hip joint, bilateral: Secondary | ICD-10-CM | POA: Diagnosis not present

## 2022-07-12 DIAGNOSIS — M19031 Primary osteoarthritis, right wrist: Secondary | ICD-10-CM | POA: Diagnosis not present

## 2022-07-12 DIAGNOSIS — T7840XA Allergy, unspecified, initial encounter: Secondary | ICD-10-CM | POA: Diagnosis not present

## 2022-07-12 DIAGNOSIS — K219 Gastro-esophageal reflux disease without esophagitis: Secondary | ICD-10-CM | POA: Diagnosis not present

## 2022-07-12 DIAGNOSIS — R69 Illness, unspecified: Secondary | ICD-10-CM | POA: Diagnosis not present

## 2022-07-12 DIAGNOSIS — G8918 Other acute postprocedural pain: Secondary | ICD-10-CM | POA: Diagnosis not present

## 2022-07-18 DIAGNOSIS — M13831 Other specified arthritis, right wrist: Secondary | ICD-10-CM | POA: Diagnosis not present

## 2022-07-18 DIAGNOSIS — G8918 Other acute postprocedural pain: Secondary | ICD-10-CM | POA: Diagnosis not present

## 2022-07-18 DIAGNOSIS — Z96643 Presence of artificial hip joint, bilateral: Secondary | ICD-10-CM | POA: Diagnosis not present

## 2022-07-31 DIAGNOSIS — G8918 Other acute postprocedural pain: Secondary | ICD-10-CM | POA: Diagnosis not present

## 2022-07-31 DIAGNOSIS — M13831 Other specified arthritis, right wrist: Secondary | ICD-10-CM | POA: Diagnosis not present

## 2022-07-31 DIAGNOSIS — Z96643 Presence of artificial hip joint, bilateral: Secondary | ICD-10-CM | POA: Diagnosis not present

## 2022-08-14 DIAGNOSIS — G8918 Other acute postprocedural pain: Secondary | ICD-10-CM | POA: Diagnosis not present

## 2022-08-14 DIAGNOSIS — M25631 Stiffness of right wrist, not elsewhere classified: Secondary | ICD-10-CM | POA: Diagnosis not present

## 2022-08-14 DIAGNOSIS — Z96643 Presence of artificial hip joint, bilateral: Secondary | ICD-10-CM | POA: Diagnosis not present

## 2022-08-14 DIAGNOSIS — M13831 Other specified arthritis, right wrist: Secondary | ICD-10-CM | POA: Diagnosis not present

## 2022-08-14 DIAGNOSIS — M79641 Pain in right hand: Secondary | ICD-10-CM | POA: Diagnosis not present

## 2022-08-29 ENCOUNTER — Telehealth: Payer: Self-pay

## 2022-08-29 NOTE — Telephone Encounter (Signed)
Called patient to schedule Medicare Annual Wellness Visit (AWV). No voicemail available to leave a message.  Last date of AWV: eligible 01/14/19 for AWV-I  Please schedule an appointment at any time on Annual Wellness Visit schedule.

## 2022-10-23 DIAGNOSIS — M79641 Pain in right hand: Secondary | ICD-10-CM | POA: Diagnosis not present

## 2022-10-23 DIAGNOSIS — G8918 Other acute postprocedural pain: Secondary | ICD-10-CM | POA: Diagnosis not present

## 2022-10-23 DIAGNOSIS — Z96643 Presence of artificial hip joint, bilateral: Secondary | ICD-10-CM | POA: Diagnosis not present

## 2022-10-23 DIAGNOSIS — M13831 Other specified arthritis, right wrist: Secondary | ICD-10-CM | POA: Diagnosis not present

## 2022-11-20 ENCOUNTER — Other Ambulatory Visit: Payer: Self-pay | Admitting: Internal Medicine

## 2023-01-11 DIAGNOSIS — D492 Neoplasm of unspecified behavior of bone, soft tissue, and skin: Secondary | ICD-10-CM | POA: Diagnosis not present

## 2023-01-11 DIAGNOSIS — C44722 Squamous cell carcinoma of skin of right lower limb, including hip: Secondary | ICD-10-CM | POA: Diagnosis not present

## 2023-01-11 DIAGNOSIS — Z872 Personal history of diseases of the skin and subcutaneous tissue: Secondary | ICD-10-CM | POA: Diagnosis not present

## 2023-01-11 DIAGNOSIS — L821 Other seborrheic keratosis: Secondary | ICD-10-CM | POA: Diagnosis not present

## 2023-01-11 DIAGNOSIS — L570 Actinic keratosis: Secondary | ICD-10-CM | POA: Diagnosis not present

## 2023-02-01 DIAGNOSIS — C44722 Squamous cell carcinoma of skin of right lower limb, including hip: Secondary | ICD-10-CM | POA: Diagnosis not present

## 2023-02-20 ENCOUNTER — Other Ambulatory Visit: Payer: Self-pay | Admitting: Internal Medicine

## 2023-05-18 ENCOUNTER — Other Ambulatory Visit: Payer: Self-pay | Admitting: Internal Medicine

## 2023-05-18 ENCOUNTER — Other Ambulatory Visit: Payer: Self-pay

## 2023-05-24 ENCOUNTER — Other Ambulatory Visit: Payer: Self-pay | Admitting: Internal Medicine

## 2023-05-28 ENCOUNTER — Ambulatory Visit: Payer: Self-pay | Admitting: Internal Medicine

## 2023-05-28 NOTE — Telephone Encounter (Signed)
 Called and spoke with Pt to let him know prescription was sent in on 05/25/23. Pt states he has already picked them up.

## 2023-05-28 NOTE — Telephone Encounter (Signed)
 Copied from CRM [id number]. Topic: Clinical - Medication Question >> May 25, 2023 12:57 PM [name] wrote: Reason for CRM: Patient calling in inquiring about a refill for his clonazePAM  (KLONOPIN ) 0.5 MG tablet because he's out, but medication shows provider has discontinued medication, patient has an appointment with provider on 1/23. Please reach out to patient for some clarification.  Peter [name] [phone]

## 2023-05-29 ENCOUNTER — Encounter: Payer: Medicare HMO | Admitting: Internal Medicine

## 2023-06-07 ENCOUNTER — Encounter: Payer: Self-pay | Admitting: Internal Medicine

## 2023-06-07 ENCOUNTER — Encounter: Payer: Medicare HMO | Admitting: Internal Medicine

## 2023-06-21 ENCOUNTER — Ambulatory Visit: Payer: Medicare HMO | Admitting: Internal Medicine

## 2023-06-21 ENCOUNTER — Encounter: Payer: Self-pay | Admitting: Internal Medicine

## 2023-06-21 VITALS — BP 126/82 | HR 85 | Temp 98.5°F | Ht 68.0 in | Wt 193.0 lb

## 2023-06-21 DIAGNOSIS — E559 Vitamin D deficiency, unspecified: Secondary | ICD-10-CM | POA: Diagnosis not present

## 2023-06-21 DIAGNOSIS — R739 Hyperglycemia, unspecified: Secondary | ICD-10-CM | POA: Diagnosis not present

## 2023-06-21 DIAGNOSIS — E538 Deficiency of other specified B group vitamins: Secondary | ICD-10-CM

## 2023-06-21 DIAGNOSIS — Z0001 Encounter for general adult medical examination with abnormal findings: Secondary | ICD-10-CM | POA: Diagnosis not present

## 2023-06-21 DIAGNOSIS — E7800 Pure hypercholesterolemia, unspecified: Secondary | ICD-10-CM | POA: Diagnosis not present

## 2023-06-21 DIAGNOSIS — F411 Generalized anxiety disorder: Secondary | ICD-10-CM | POA: Diagnosis not present

## 2023-06-21 DIAGNOSIS — Z125 Encounter for screening for malignant neoplasm of prostate: Secondary | ICD-10-CM | POA: Diagnosis not present

## 2023-06-21 LAB — CBC WITH DIFFERENTIAL/PLATELET
Basophils Absolute: 0 10*3/uL (ref 0.0–0.1)
Basophils Relative: 0.6 % (ref 0.0–3.0)
Eosinophils Absolute: 0.1 10*3/uL (ref 0.0–0.7)
Eosinophils Relative: 1 % (ref 0.0–5.0)
HCT: 45.5 % (ref 39.0–52.0)
Hemoglobin: 15.5 g/dL (ref 13.0–17.0)
Lymphocytes Relative: 27.8 % (ref 12.0–46.0)
Lymphs Abs: 1.9 10*3/uL (ref 0.7–4.0)
MCHC: 34.2 g/dL (ref 30.0–36.0)
MCV: 96.2 fL (ref 78.0–100.0)
Monocytes Absolute: 0.7 10*3/uL (ref 0.1–1.0)
Monocytes Relative: 10.1 % (ref 3.0–12.0)
Neutro Abs: 4.2 10*3/uL (ref 1.4–7.7)
Neutrophils Relative %: 60.5 % (ref 43.0–77.0)
Platelets: 218 10*3/uL (ref 150.0–400.0)
RBC: 4.73 Mil/uL (ref 4.22–5.81)
RDW: 13.8 % (ref 11.5–15.5)
WBC: 6.9 10*3/uL (ref 4.0–10.5)

## 2023-06-21 LAB — HEPATIC FUNCTION PANEL
ALT: 25 U/L (ref 0–53)
AST: 21 U/L (ref 0–37)
Albumin: 4.9 g/dL (ref 3.5–5.2)
Alkaline Phosphatase: 78 U/L (ref 39–117)
Bilirubin, Direct: 0.1 mg/dL (ref 0.0–0.3)
Total Bilirubin: 0.5 mg/dL (ref 0.2–1.2)
Total Protein: 7 g/dL (ref 6.0–8.3)

## 2023-06-21 LAB — URINALYSIS, ROUTINE W REFLEX MICROSCOPIC
Bilirubin Urine: NEGATIVE
Hgb urine dipstick: NEGATIVE
Ketones, ur: NEGATIVE
Leukocytes,Ua: NEGATIVE
Nitrite: NEGATIVE
Specific Gravity, Urine: <=1.005 — AB (ref 1.000–1.030)
Total Protein, Urine: NEGATIVE
Urine Glucose: NEGATIVE
Urobilinogen, UA: 0.2 (ref 0.0–1.0)
pH: 6 (ref 5.0–8.0)

## 2023-06-21 LAB — LIPID PANEL
Cholesterol: 167 mg/dL (ref 0–200)
HDL: 53.2 mg/dL (ref >39.00)
LDL Cholesterol: 81 mg/dL (ref 0–99)
NonHDL: 113.62
Total CHOL/HDL Ratio: 3
Triglycerides: 164 mg/dL — ABNORMAL HIGH (ref 0.0–149.0)
VLDL: 32.8 mg/dL (ref 0.0–40.0)

## 2023-06-21 LAB — BASIC METABOLIC PANEL
BUN: 20 mg/dL (ref 6–23)
CO2: 29 meq/L (ref 19–32)
Calcium: 9.5 mg/dL (ref 8.4–10.5)
Chloride: 102 meq/L (ref 96–112)
Creatinine, Ser: 0.9 mg/dL (ref 0.40–1.50)
GFR: 89.1 mL/min (ref >60.00)
Glucose, Bld: 95 mg/dL (ref 70–99)
Potassium: 4.9 meq/L (ref 3.5–5.1)
Sodium: 139 meq/L (ref 135–145)

## 2023-06-21 LAB — TSH: TSH: 2.96 u[IU]/mL (ref 0.35–5.50)

## 2023-06-21 LAB — VITAMIN D 25 HYDROXY (VIT D DEFICIENCY, FRACTURES): VITD: 47.97 ng/mL (ref 30.00–100.00)

## 2023-06-21 LAB — HEMOGLOBIN A1C: Hgb A1c MFr Bld: 5.9 % (ref 4.6–6.5)

## 2023-06-21 LAB — MICROALBUMIN / CREATININE URINE RATIO
Creatinine,U: 66.8 mg/dL
Microalb Creat Ratio: 2.9 mg/g (ref 0.0–30.0)
Microalb, Ur: 1.9 mg/dL (ref 0.0–1.9)

## 2023-06-21 LAB — VITAMIN B12: Vitamin B-12: 402 pg/mL (ref 211–911)

## 2023-06-21 LAB — PSA: PSA: 0.52 ng/mL (ref 0.10–4.00)

## 2023-06-21 MED ORDER — ROSUVASTATIN CALCIUM 20 MG PO TABS: 20.0000 mg | ORAL_TABLET | Freq: Every day | ORAL | 3 refills | Status: DC

## 2023-06-21 MED ORDER — OMEPRAZOLE 40 MG PO CPDR: 40.0000 mg | DELAYED_RELEASE_CAPSULE | Freq: Every day | ORAL | 3 refills | Status: DC

## 2023-06-21 MED ORDER — TADALAFIL 20 MG PO TABS: 10.0000 mg | ORAL_TABLET | ORAL | 11 refills | Status: AC | PRN

## 2023-06-21 NOTE — Progress Notes (Signed)
 The test results show that your current treatment is OK, as the tests are stable.  Please continue the same plan.  There is no other need for change of treatment or further evaluation based on these results, at this time.  thanks

## 2023-06-21 NOTE — Progress Notes (Signed)
 Patient ID: Peter [name], male   DOB: 1955-07-29, 68 y.o.   MRN: [id number]         Chief Complaint:: wellness exam and hld, anxiety, hyprglycemia, low vit d,        HPI:  Peter [name] is a 68 y.o. male here for wellness exam;for shingrix and prevnar at pharmacy, o/w up to date                        Also Pt denies chest pain, increased sob or doe, wheezing, orthopnea, PND, increased LE swelling, palpitations, dizziness or syncope.   Pt denies polydipsia, polyuria, or new focal neuro s/s.    Pt denies fever, wt loss, night sweats, loss of appetite, or other constitutional symptoms  Denies worsening depressive symptoms, suicidal ideation, or panic; has ongoing anxiety, Able to tolerate crestor  3 times weekly   Wt Readings from Last 3 Encounters:  06/21/23 193 lb (87.5 kg)  05/25/22 193 lb (87.5 kg)  05/23/21 192 lb 3.2 oz (87.2 kg)   BP Readings from Last 3 Encounters:  06/21/23 126/82  05/25/22 122/76  05/23/21 108/72   Immunization History  Administered Date(s) Administered   Influenza, High Dose Seasonal PF 02/01/2023   Influenza,inj,Quad PF,6+ Mos 03/05/2018, 02/15/2021   Influenza-Unspecified 02/20/2016, 03/15/2017, 02/12/2018, 02/19/2019, 02/09/2020, 02/06/2022   PFIZER(Purple Top)SARS-COV-2 Vaccination 09/10/2019, 10/01/2019   Tdap 03/10/2016   Health Maintenance Due  Topic Date Due   Medicare Annual Wellness (AWV)  Never done   Zoster Vaccines- Shingrix (1 of 2) Never done   Pneumonia Vaccine 30+ Years old (1 of 1 - PCV) Never done      Past Medical History:  Diagnosis Date   Anxiety state, unspecified 12/30/2012   Arthritis    Depression    GERD (gastroesophageal reflux disease)    Migraine    Neck pain 11/22/2014   Past Surgical History:  Procedure Laterality Date   right hip revision  2007   right hip THA  2002   TONSILLECTOMY     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 He has never used smokeless tobacco. He reports current alcohol use. He reports current drug use. Drug: Marijuana. family history includes Alcoholism in an other family member; Arthritis in an other family member; Colon polyps in his sister; Heart disease in an other family member; Hypertension in an other family member; Lung cancer in an other family member; Stroke in an other family member. Allergies  Allergen Reactions   Lunesta [Eszopiclone] Anaphylaxis and Other (See Comments)    Metal taste in mouth, lungs filled with fluid   Demerol [Meperidine] Other (See Comments)    hallucinations   Tramadol  Other (See Comments)    Chest pain   Buspar  [Buspirone ] Other (See Comments)    Shaky, nausea   Current Outpatient Medications on File Prior to Visit  Medication Sig Dispense Refill   Cholecalciferol (VITAMIN D3) 50 MCG (2000 UT) TABS Take by mouth.     clonazePAM  (KLONOPIN ) 0.5 MG tablet TAKE 1 TABLET BY MOUTH TWICE A DAY AS NEEDED 60 tablet 2   co-enzyme Q-10 30 MG capsule Take 30 mg by mouth 3 (three) times daily.     Multiple Vitamin (MULTIVITAMIN WITH MINERALS) TABS tablet Take 1  tablet by mouth daily.     No current facility-administered medications on file prior to visit.        ROS:  All others reviewed and negative.  Objective        PE:  BP 126/82 (BP Location: Right Arm, Patient Position: Sitting, Cuff Size: Normal)   Pulse 85   Temp 98.5 F (36.9 C) (Oral)   Ht 5' 8 (1.727 m)   Wt 193 lb (87.5 kg)   SpO2 98%   BMI 29.35 kg/m                 Constitutional: Pt appears in NAD               HENT: Head: NCAT.                Right Ear: External ear normal.                 Left Ear: External ear normal.                Eyes: . Pupils are equal, round, and reactive to light. Conjunctivae and EOM are normal               Nose: without d/c or deformity                Neck: Neck supple. Gross normal ROM               Cardiovascular: Normal rate and regular rhythm.                 Pulmonary/Chest: Effort normal and breath sounds without rales or wheezing.                Abd:  Soft, NT, ND, + BS, no organomegaly               Neurological: Pt is alert. At baseline orientation, motor grossly intact               Skin: Skin is warm. No rashes, no other new lesions, LE edema - none               Psychiatric: Pt behavior is normal without agitation,, mod nervous   Micro: none  Cardiac tracings I have personally interpreted today:  none  Pertinent Radiological findings (summarize): none   Lab Results  Component Value Date   WBC 6.9 06/21/2023   HGB 15.5 06/21/2023   HCT 45.5 06/21/2023   PLT 218.0 06/21/2023   GLUCOSE 95 06/21/2023   CHOL 167 06/21/2023   TRIG 164.0 (H) 06/21/2023   HDL 53.20 06/21/2023   LDLDIRECT 88.0 05/23/2021   LDLCALC 81 06/21/2023   ALT 25 06/21/2023   AST 21 06/21/2023   NA 139 06/21/2023   K 4.9 06/21/2023   CL 102 06/21/2023   CREATININE 0.90 06/21/2023   BUN 20 06/21/2023   CO2 29 06/21/2023   TSH 2.96 06/21/2023   PSA 0.52 06/21/2023   INR 0.96 02/17/2016   HGBA1C 5.9 06/21/2023   MICROALBUR 1.9 06/21/2023   Assessment/Plan:  Peter [name] is a 68 y.o. Other or two or more races [6] male with  has a past medical history of Anxiety state, unspecified (12/30/2012), Arthritis, Depression, GERD (gastroesophageal reflux disease), Migraine, and Neck pain (11/22/2014).  Encounter for well adult exam with abnormal findings Age and sex appropriate education and counseling updated with regular exercise and diet Referrals for preventative services - none needed Immunizations addressed -  for shingrix and prevnar at pharmacy Smoking counseling  - none needed Evidence for depression or other mood disorder - chronic stable anxiety Most recent labs reviewed. I have personally reviewed and have noted: 1) the patient's  medical and social history 2) The patient's current medications and supplements 3) The patient's height, weight, and BMI have been recorded in the chart   Anxiety state Chronic stable, declines ssri or need for counseling, cont klonopin  bid prn  Hyperglycemia Lab Results  Component Value Date   HGBA1C 5.9 06/21/2023   Stable, pt to continue current medical treatment  - diet, wt control   Hyperlipidemia Lab Results  Component Value Date   LDLCALC 81 06/21/2023   uncontrolled, pt to cont crestor  20 mg  - 3 times weekly, declines add zetia or repatha   Mild vitamin D  deficiency Last vitamin D  Lab Results  Component Value Date   VD25OH 47.97 06/21/2023   Stable, cont oral replacement  Followup: Return in about 1 year (around 06/20/2024).  Lynwood Rush, MD 06/23/2023 7:12 PM [facility]

## 2023-06-21 NOTE — Patient Instructions (Addendum)

## 2023-06-23 ENCOUNTER — Encounter: Payer: Self-pay | Admitting: Internal Medicine

## 2023-06-23 NOTE — Assessment & Plan Note (Signed)
 Lab Results  Component Value Date   HGBA1C 5.9 06/21/2023   Stable, pt to continue current medical treatment  - diet, wt control

## 2023-06-23 NOTE — Assessment & Plan Note (Signed)
 Age and sex appropriate education and counseling updated with regular exercise and diet Referrals for preventative services - none needed Immunizations addressed - for shingrix and prevnar at pharmacy Smoking counseling  - none needed Evidence for depression or other mood disorder - chronic stable anxiety Most recent labs reviewed. I have personally reviewed and have noted: 1) the patient's medical and social history 2) The patient's current medications and supplements 3) The patient's height, weight, and BMI have been recorded in the chart

## 2023-06-23 NOTE — Assessment & Plan Note (Signed)
 Last vitamin D  Lab Results  Component Value Date   VD25OH 47.97 06/21/2023   Stable, cont oral replacement

## 2023-06-23 NOTE — Assessment & Plan Note (Signed)
 Lab Results  Component Value Date   LDLCALC 81 06/21/2023   uncontrolled, pt to cont crestor  20 mg  - 3 times weekly, declines add zetia or repatha

## 2023-06-23 NOTE — Assessment & Plan Note (Addendum)
 Chronic stable, declines ssri or need for counseling, cont klonopin  bid prn

## 2023-06-25 ENCOUNTER — Encounter: Payer: Self-pay | Admitting: Internal Medicine

## 2023-08-01 DIAGNOSIS — Z08 Encounter for follow-up examination after completed treatment for malignant neoplasm: Secondary | ICD-10-CM | POA: Diagnosis not present

## 2023-08-01 DIAGNOSIS — D2261 Melanocytic nevi of right upper limb, including shoulder: Secondary | ICD-10-CM | POA: Diagnosis not present

## 2023-08-01 DIAGNOSIS — L570 Actinic keratosis: Secondary | ICD-10-CM | POA: Diagnosis not present

## 2023-08-01 DIAGNOSIS — L814 Other melanin hyperpigmentation: Secondary | ICD-10-CM | POA: Diagnosis not present

## 2023-08-01 DIAGNOSIS — L578 Other skin changes due to chronic exposure to nonionizing radiation: Secondary | ICD-10-CM | POA: Diagnosis not present

## 2023-08-01 DIAGNOSIS — L821 Other seborrheic keratosis: Secondary | ICD-10-CM | POA: Diagnosis not present

## 2023-08-01 DIAGNOSIS — Z85828 Personal history of other malignant neoplasm of skin: Secondary | ICD-10-CM | POA: Diagnosis not present

## 2023-08-01 DIAGNOSIS — D225 Melanocytic nevi of trunk: Secondary | ICD-10-CM | POA: Diagnosis not present

## 2023-08-21 ENCOUNTER — Other Ambulatory Visit: Payer: Self-pay | Admitting: Internal Medicine

## 2023-08-23 ENCOUNTER — Ambulatory Visit

## 2023-08-23 VITALS — Ht 68.5 in | Wt 183.0 lb

## 2023-08-23 DIAGNOSIS — Z0000 Encounter for general adult medical examination without abnormal findings: Secondary | ICD-10-CM | POA: Diagnosis not present

## 2023-08-23 NOTE — Progress Notes (Signed)
 Subjective:   Peter [name] is a 68 y.o. who presents for a Medicare Wellness preventive visit.  Visit Complete: Virtual I connected with  Tamala Ser on 08/23/23 by a video and audio enabled telemedicine application and verified that I am speaking with the correct person using two identifiers.  Patient Location: Home  Provider Location: Office/Clinic  I discussed the limitations of evaluation and management by telemedicine. The patient expressed understanding and agreed to proceed.  Vital Signs: Because this visit was a virtual/telehealth visit, some criteria may be missing or patient reported. Any vitals not documented were not able to be obtained and vitals that have been documented are patient reported.  Persons Participating in Visit: Patient.  AWV Questionnaire: No: Patient Medicare AWV questionnaire was not completed prior to this visit.  Cardiac Risk Factors include: advanced age (>3men, >33 women);dyslipidemia;male gender     Objective:    Today's Vitals   08/23/23 1016  Weight: 183 lb (83 kg)  Height: 5' 8.5" (1.74 m)   Body mass index is 27.42 kg/m.     08/23/2023   10:16 AM 02/28/2016   12:31 PM 02/17/2016    1:16 PM 12/13/2014    7:16 PM  Advanced Directives  Does Patient Have a Medical Advance Directive? No Yes Yes No  Type of Advance Directive  Living will Living will   Does patient want to make changes to medical advance directive?  No - Patient declined No - Patient declined   Copy of Healthcare Power of Attorney in Chart?  No - copy requested No - copy requested   Would patient like information on creating a medical advance directive? Yes (MAU/Ambulatory/Procedural Areas - Information given)   No - patient declined information    Current Medications (verified) Outpatient Encounter Medications as of 08/23/2023  Medication Sig   Cholecalciferol (VITAMIN D3) 50 MCG (2000 UT) TABS Take by mouth.   clonazePAM (KLONOPIN) 0.5 MG tablet TAKE 1 TABLET BY  MOUTH TWICE A DAY AS NEEDED   co-enzyme Q-10 30 MG capsule Take 30 mg by mouth 3 (three) times daily.   Multiple Vitamin (MULTIVITAMIN WITH MINERALS) TABS tablet Take 1 tablet by mouth daily.   omeprazole (PRILOSEC) 40 MG capsule Take 1 capsule (40 mg total) by mouth daily.   rosuvastatin (CRESTOR) 20 MG tablet Take 1 tablet (20 mg total) by mouth daily.   tadalafil (CIALIS) 20 MG tablet Take 0.5-1 tablets (10-20 mg total) by mouth every other day as needed for erectile dysfunction.   No facility-administered encounter medications on file as of 08/23/2023.    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   Procedure: TOTAL HIP ARTHROPLASTY ANTERIOR APPROACH;  Surgeon: Gean Birchwood, MD;  Location: [facility] OR;  Service: Orthopedics;  Laterality: Left;   tumor removed     Family History  Problem Relation Age of Onset   Alcoholism Other    Arthritis Other    Lung cancer Other    Heart disease Other    Stroke Other    Hypertension Other  Colon polyps Sister    Colon cancer Neg Hx    Social History   Socioeconomic History   Marital status: Single    Spouse name: Not on file   Number of children: 2   Years of education: Not on file   Highest education level: Associate degree: occupational, Scientist, product/process development, or vocational program  Occupational History   Not on file  Tobacco Use   Smoking status: Never    Passive exposure: Never   Smokeless tobacco: Never  Substance and Sexual Activity   Alcohol use: Yes    Comment: occ wine   Drug use: Yes    Types: Marijuana    Comment: last  usage 1 week ago   Sexual activity: Not on file  Other Topics Concern   Not on file  Social History Narrative   Not on file   Social Drivers of Health   Financial Resource Strain: Low Risk  (08/23/2023)   Overall Financial Resource Strain (CARDIA)    Difficulty of Paying Living Expenses: Not hard at all  Food Insecurity: No Food Insecurity (08/23/2023)   Hunger Vital Sign    Worried About Running Out of Food in the Last Year: Never true    Ran Out of Food in the Last Year: Never true  Transportation Needs: No Transportation Needs (08/23/2023)   PRAPARE - Administrator, Civil Service (Medical): No    Lack of Transportation (Non-Medical): No  Physical Activity: Sufficiently Active (08/23/2023)   Exercise Vital Sign    Days of Exercise per Week: 7 days    Minutes of Exercise per Session: 120 min  Stress: No Stress Concern Present (08/23/2023)   [employer] of Occupational Health - Occupational Stress Questionnaire    Feeling of Stress : Not at all  Social Connections: Socially Integrated (08/23/2023)   Social Connection and Isolation Panel [NHANES]    Frequency of Communication with Friends and Family: More than three times a week    Frequency of Social Gatherings with Friends and Family: More than three times a week    Attends Religious Services: More than 4 times per year    Active Member of [employer] or Organizations: Yes    Attends Engineer, structural: More than 4 times per year    Marital Status: Married    Tobacco Counseling Counseling given: Not Answered    Clinical Intake:  Pre-visit preparation completed: Yes  Pain : No/denies pain     BMI - recorded: 27.42 Nutritional Risks: None Diabetes: No  Lab Results  Component Value Date   HGBA1C 5.9 06/21/2023   HGBA1C 6.0 05/25/2022   HGBA1C 6.0 05/23/2021     How often do you need to have someone help you when you read instructions, pamphlets, or other written materials from your doctor or  pharmacy?: 1 - Never  Interpreter Needed?: No  Information entered by :: Hassell Halim, CMA   Activities of Daily Living     08/23/2023   10:22 AM  In your present state of health, do you have any difficulty performing the following activities:  Hearing? 0  Vision? 0  Difficulty concentrating or making decisions? 0  Walking or climbing stairs? 0  Dressing or bathing? 0  Doing errands, shopping? 0  Preparing Food and eating ? N  Using the Toilet? N  In the past six months, have you accidently leaked urine? N  Do you have problems with loss of bowel control? N  Managing your Medications? N  Managing your Finances? N  Housekeeping or managing your Housekeeping? N    Patient Care Team: Corwin Levins, MD as PCP - General (Internal Medicine)  Indicate any recent Medical Services you may have received from other than [facility] providers in the past year (date may be approximate).     Assessment:   This is a routine wellness examination for [facility].  Hearing/Vision screen Hearing Screening - Comments:: Denies hearing difficulties   Vision Screening - Comments:: Denies vision difficulties   Goals Addressed               This Visit's Progress     Patient Stated (pt-stated)        Patient stated that he plans to get more tone and continue exercising and take supplements daily.       Depression Screen     08/23/2023   10:35 AM 06/21/2023   10:18 AM 06/21/2023    9:26 AM 05/25/2022    9:36 AM 05/25/2022    9:14 AM 05/23/2021    9:43 AM 05/23/2021    9:13 AM  PHQ 2/9 Scores  PHQ - 2 Score 0 0 0 0 0 1 0  PHQ- 9 Score 0    0      Fall Risk     08/23/2023   10:24 AM 06/21/2023   10:18 AM 05/25/2022    9:35 AM 05/25/2022    9:14 AM 05/23/2021    9:42 AM  Fall Risk   Falls in the past year? 0 0 0 0 0  Number falls in past yr: 0 0 0  0  Injury with Fall? 0 0 0 0 0  Risk for fall due to : No Fall Risks   No Fall Risks   Follow up Falls prevention discussed;Falls evaluation  completed   Falls evaluation completed     MEDICARE RISK AT HOME:  Medicare Risk at Home Any stairs in or around the home?: No If so, are there any without handrails?: No Home free of loose throw rugs in walkways, pet beds, electrical cords, etc?: Yes Adequate lighting in your home to reduce risk of falls?: Yes Life alert?: No Use of a cane, walker or w/c?: No Grab bars in the bathroom?: No Shower chair or bench in shower?: No Elevated toilet seat or a handicapped toilet?: No  TIMED UP AND GO:  Was the test performed?  No  Cognitive Function: 6CIT completed        08/23/2023   10:24 AM  6CIT Screen  What Year? 0 points  What month? 0 points  What time? 0 points  Count back from 20 0 points  Months in reverse 0 points  Repeat phrase 4 points  Total Score 4 points    Immunizations Immunization History  Administered Date(s) Administered   Influenza, High Dose Seasonal PF 02/01/2023   Influenza,inj,Quad PF,6+ Mos 03/05/2018, 02/15/2021   Influenza-Unspecified 02/20/2016, 03/15/2017, 02/12/2018, 02/19/2019, 02/09/2020, 02/06/2022   PFIZER(Purple Top)SARS-COV-2 Vaccination 09/10/2019, 10/01/2019   Tdap 03/10/2016    Screening Tests Health Maintenance  Topic Date Due   Zoster Vaccines- Shingrix (1 of 2) Never done   Pneumonia Vaccine 34+ Years old (1 of 1 - PCV) Never done   COVID-19 Vaccine (3 - Pfizer risk series) 10/29/2019   INFLUENZA VACCINE  12/14/2023   Medicare Annual Wellness (AWV)  08/22/2024   DTaP/Tdap/Td (2 - Td or Tdap) 03/10/2026   Colonoscopy  11/23/2030   Hepatitis C Screening  Completed   HPV  VACCINES  Aged Out   Meningococcal B Vaccine  Aged Out    Health Maintenance  Health Maintenance Due  Topic Date Due   Zoster Vaccines- Shingrix (1 of 2) Never done   Pneumonia Vaccine 80+ Years old (1 of 1 - PCV) Never done   COVID-19 Vaccine (3 - Pfizer risk series) 10/29/2019   Health Maintenance Items Addressed: 08/23/2023   Additional  Screening:  Vision Screening: Recommended annual ophthalmology exams for early detection of glaucoma and other disorders of the eye.  Pt stated plans to locate an Ophthalmologist near [place], [place] where he's residing.    Dental Screening: Recommended annual dental exams for proper oral hygiene  Community Resource Referral / Chronic Care Management: CRR required this visit?  No   CCM required this visit?  No     Plan:     I have personally reviewed and noted the following in the patient's chart:   Medical and social history Use of alcohol, tobacco or illicit drugs  Current medications and supplements including opioid prescriptions. Patient is not currently taking opioid prescriptions. Functional ability and status Nutritional status Physical activity Advanced directives List of other physicians Hospitalizations, surgeries, and ER visits in previous 12 months Vitals Screenings to include cognitive, depression, and falls Referrals and appointments  In addition, I have reviewed and discussed with patient certain preventive protocols, quality metrics, and best practice recommendations. A written personalized care plan for preventive services as well as general preventive health recommendations were provided to patient.     Darreld Mclean, CMA   08/23/2023   After Visit Summary: (MyChart) Due to this being a telephonic visit, the after visit summary with patients personalized plan was offered to patient via MyChart   Notes: Nothing significant to report at this time.

## 2023-08-23 NOTE — Patient Instructions (Addendum)
 Mr. Peter [name] , Thank you for taking time to come for your Medicare Wellness Visit. I appreciate your ongoing commitment to your health goals. Please review the following plan we discussed and let me know if I can assist you in the future.   Referrals/Orders/Follow-Ups/Clinician Recommendations: Aim for 30 minutes of exercise or brisk walking, 6-8 glasses of water, and 5 servings of fruits and vegetables each day.   This is a list of the screening recommended for you and due dates:  Health Maintenance  Topic Date Due   Zoster (Shingles) Vaccine (1 of 2) Never done   Pneumonia Vaccine (1 of 1 - PCV) Never done   COVID-19 Vaccine (3 - Pfizer risk series) 10/29/2019   Flu Shot  12/14/2023   Medicare Annual Wellness Visit  08/22/2024   DTaP/Tdap/Td vaccine (2 - Td or Tdap) 03/10/2026   Colon Cancer Screening  11/23/2030   Hepatitis C Screening  Completed   HPV Vaccine  Aged Out   Meningitis B Vaccine  Aged Out    Advanced directives: (Provided) Advance directive discussed with you today. I have provided a copy for you to complete at home and have notarized. Once this is complete, please bring a copy in to our office so we can scan it into your chart.   Next Medicare Annual Wellness Visit scheduled for next year: Yes

## 2023-09-29 IMAGING — DX DG WRIST COMPLETE 3+V*R*
4 series · 4 of 4 positions shown · non-contrast
Comparison: Right wrist radiograph dated 07/29/2018.

CLINICAL DATA: Right wrist pain.

EXAM:
RIGHT WRIST - COMPLETE 3+ VIEW

[wrist ap]
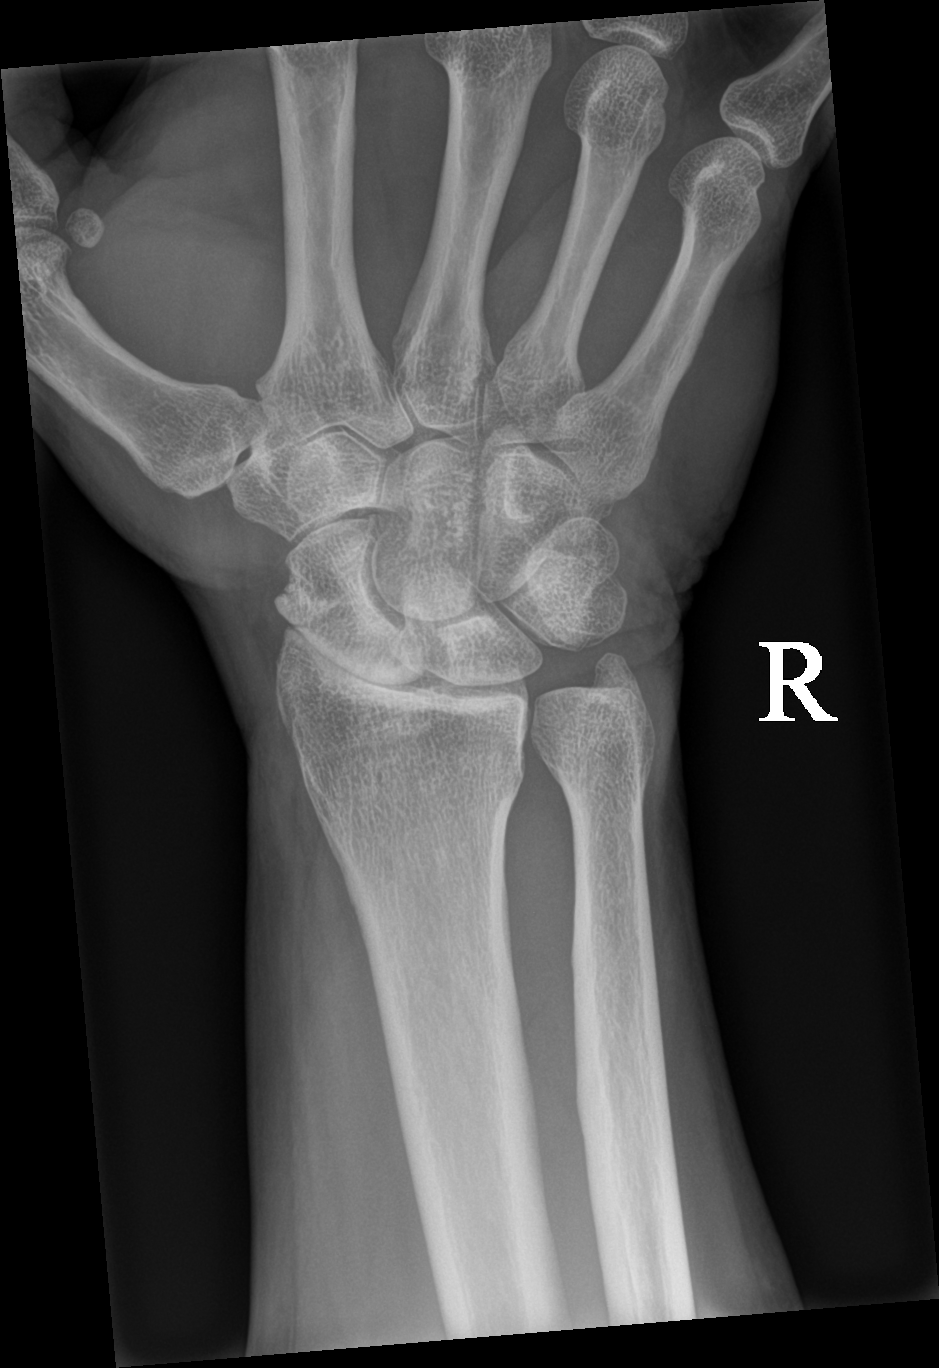

[wrist obl]
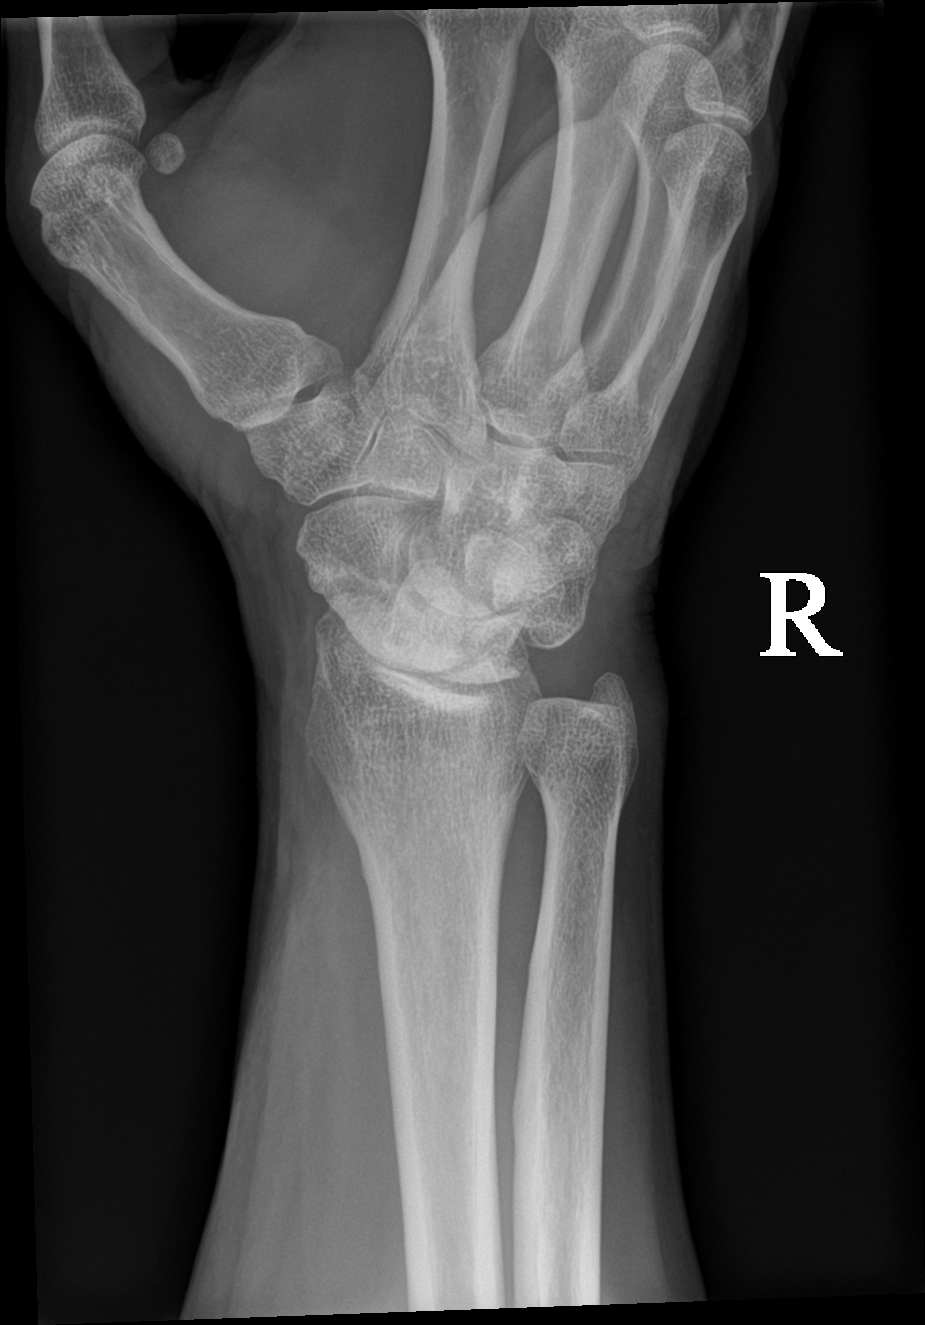

[wrist lat]
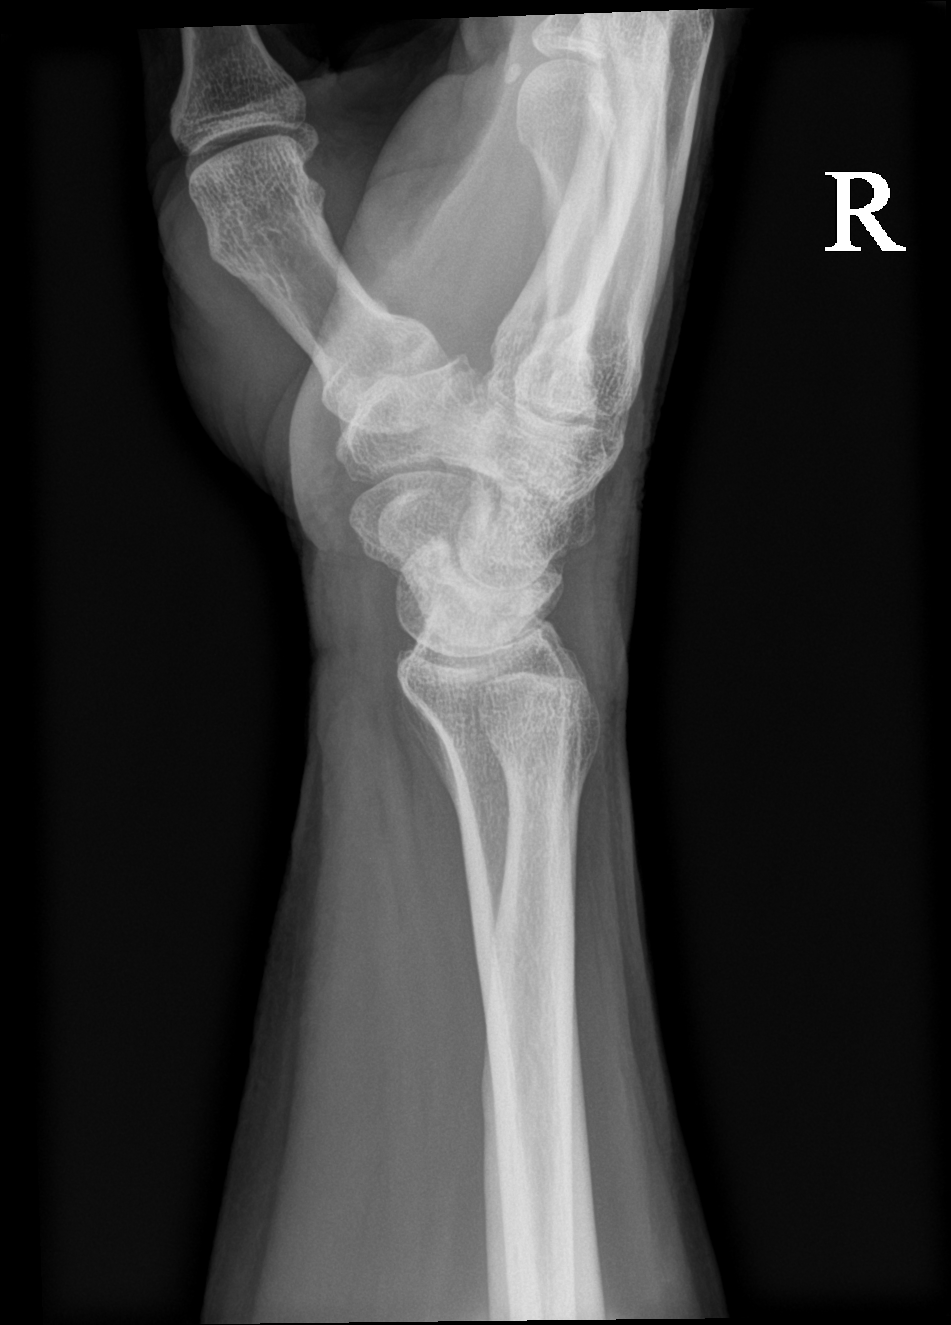

[scaphoid]
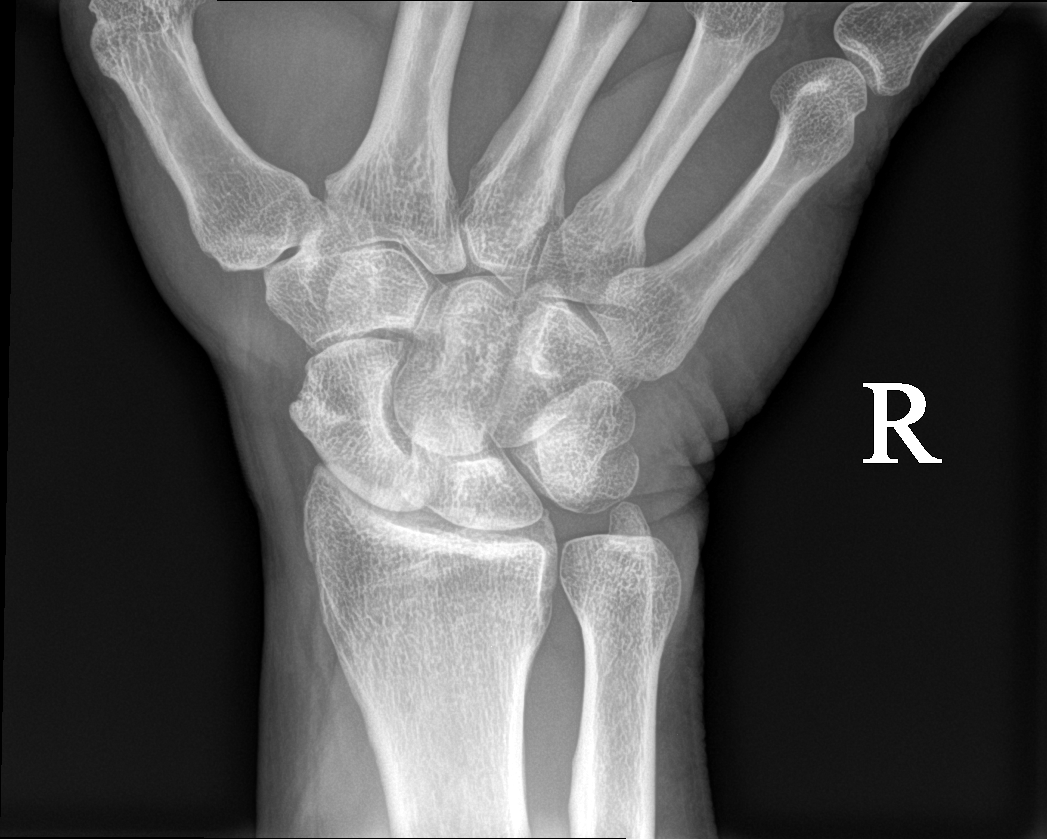

[4 of 4 positions shown; findings below may reference images not displayed]

FINDINGS: No acute fracture or dislocation. The bones are well mineralized.
There is arthritic changes of the base and narrowing of the
radiocarpal joint space. The soft tissues are unremarkable.
IMPRESSION: No acute fracture or dislocation.

## 2023-11-20 ENCOUNTER — Other Ambulatory Visit: Payer: Self-pay | Admitting: Internal Medicine

## 2023-11-30 ENCOUNTER — Encounter: Payer: Self-pay | Admitting: Internal Medicine

## 2023-12-19 DIAGNOSIS — D1801 Hemangioma of skin and subcutaneous tissue: Secondary | ICD-10-CM | POA: Diagnosis not present

## 2024-02-19 ENCOUNTER — Other Ambulatory Visit: Payer: Self-pay | Admitting: Internal Medicine

## 2024-02-21 ENCOUNTER — Other Ambulatory Visit: Payer: Self-pay | Admitting: Family

## 2024-02-21 ENCOUNTER — Telehealth: Payer: Self-pay

## 2024-02-21 MED ORDER — CLONAZEPAM 0.5 MG PO TABS: 0.5000 mg | ORAL_TABLET | Freq: Two times a day (BID) | ORAL | 0 refills | Status: DC | PRN

## 2024-02-21 NOTE — Telephone Encounter (Signed)
 Copied from CRM #[id number]. Topic: Clinical - Prescription Issue >> Feb 21, 2024 10:10 AM Berneda FALCON wrote: Reason for CRM: Patient states that this medication was not filled because the pharmacy states they are missing an authorization code Not sure if this is a prior auth or what it is missing. He is completely out as of today.  Medication-clonazePAM  (KLONOPIN ) 0.5 MG tablet  [id number] - [place], [place] - [address] [place] [address] Phone: [phone] Fax: [phone] Hours: Not open 24 hours

## 2024-02-24 ENCOUNTER — Encounter: Payer: Self-pay | Admitting: Internal Medicine

## 2024-03-11 DIAGNOSIS — Z1283 Encounter for screening for malignant neoplasm of skin: Secondary | ICD-10-CM | POA: Diagnosis not present

## 2024-03-11 DIAGNOSIS — X32XXXA Exposure to sunlight, initial encounter: Secondary | ICD-10-CM | POA: Diagnosis not present

## 2024-03-11 DIAGNOSIS — D485 Neoplasm of uncertain behavior of skin: Secondary | ICD-10-CM | POA: Diagnosis not present

## 2024-03-11 DIAGNOSIS — L821 Other seborrheic keratosis: Secondary | ICD-10-CM | POA: Diagnosis not present

## 2024-03-11 DIAGNOSIS — D2271 Melanocytic nevi of right lower limb, including hip: Secondary | ICD-10-CM | POA: Diagnosis not present

## 2024-03-11 DIAGNOSIS — L570 Actinic keratosis: Secondary | ICD-10-CM | POA: Diagnosis not present

## 2024-03-11 DIAGNOSIS — D225 Melanocytic nevi of trunk: Secondary | ICD-10-CM | POA: Diagnosis not present

## 2024-03-21 ENCOUNTER — Telehealth: Payer: Self-pay

## 2024-03-21 NOTE — Telephone Encounter (Signed)
 Copied from CRM #[id number]. Topic: General - Other >> Mar 20, 2024  2:22 PM Peter [name] wrote: Reason for CRM: Patient received a message from Dr.John that he was retiring, He is concerned and would like to know if Dr.John has any recommendations on who he should see. He states he got the list of practices but would like to know if there is a specific doctor he recommends.He had a bad experience with some else a while ago who changed his medication and patient ended up in the hospital and he does not want to go through that again.

## 2024-03-22 ENCOUNTER — Other Ambulatory Visit: Payer: Self-pay | Admitting: Family Medicine

## 2024-03-24 ENCOUNTER — Encounter: Payer: Self-pay | Admitting: Nurse Practitioner

## 2024-03-24 ENCOUNTER — Telehealth: Payer: Self-pay

## 2024-03-24 NOTE — Telephone Encounter (Signed)
 Copied from CRM [id number]. Topic: Clinical - Prescription Issue >> Mar 24, 2024  1:09 PM Peter [name] wrote: Reason for CRM: [facility] is calling because [facility]azePAM  (KLONOPIN ) 0.5 MG tablet [[id number]]; however, patient received a message from [facility] stating there is an issue with the prescription. He stated last time there was a coding issue but doesn't know what's wrong this time around and would like to know if there is anyway to contact the pharmacy and see what's wrong.

## 2024-03-25 MED ORDER — CLONAZEPAM 0.5 MG PO TABS: 0.5000 mg | ORAL_TABLET | Freq: Two times a day (BID) | ORAL | 2 refills | Status: DC | PRN

## 2024-03-25 NOTE — Telephone Encounter (Signed)
 I am not sure as well, as I did include the code on the script.  If he could find out what the problem might be with the pharmacy, that would be great.   Thanks

## 2024-03-25 NOTE — Telephone Encounter (Unsigned)
 Copied from CRM [id number]. Topic: Clinical - Prescription Issue >> Mar 24, 2024  1:09 PM Franky GRADE wrote: Reason for CRM: Patient is calling because [facility] received [facility] for clonazePAM  (KLONOPIN ) 0.5 MG tablet [[id number]]; however, patient received a message from [facility] stating there is an issue with the prescription. He stated last time there was a coding issue but doesn't know what's wrong this time around and would like to know if there is anyway to contact the pharmacy and see what's wrong. >> Mar 25, 2024 11:01 AM Tinnie C wrote: Pt calling again to check to see if this has been fixed. No update yet. >> Mar 24, 2024  4:35 PM China J wrote: Patient is calling back to check in for an update on this prescription issue. I let him know that at this time, we are still looking into getting that fixed for him and will most likely follow up by tomorrow once everything is sorted out. The patient believed that the medication is not properly coded.

## 2024-03-25 NOTE — Telephone Encounter (Unsigned)
 Copied from CRM [id number]. Topic: Clinical - Prescription Issue >> Mar 24, 2024  1:09 PM Peter [name] wrote: Reason for CRM: Patient is calling because [facility] clonazePAM  (KLONOPIN ) 0.5 MG tablet [[id number]]; however, patient received a message from [facility] stating there is an issue with the prescription. He stated last time there was a coding issue but doesn't know what's wrong this time around and would like to know if there is anyway to contact the pharmacy and see what's wrong. >> Mar 24, 2024  4:35 PM Peter [name] wrote: Patient is calling back to check in for an update on this prescription issue. I let him know that at this time, we are still looking into getting that fixed for him and will most likely follow up by tomorrow once everything is sorted out. The patient believed that the medication is not properly coded.

## 2024-04-08 ENCOUNTER — Encounter: Payer: Self-pay | Admitting: Internal Medicine

## 2024-04-23 DIAGNOSIS — L988 Other specified disorders of the skin and subcutaneous tissue: Secondary | ICD-10-CM | POA: Diagnosis not present

## 2024-05-01 ENCOUNTER — Ambulatory Visit: Admitting: Nurse Practitioner

## 2024-05-01 ENCOUNTER — Encounter: Payer: Self-pay | Admitting: Nurse Practitioner

## 2024-05-01 VITALS — BP 132/74 | HR 87 | Temp 98.0°F | Ht 68.5 in | Wt 165.2 lb

## 2024-05-01 DIAGNOSIS — E7800 Pure hypercholesterolemia, unspecified: Secondary | ICD-10-CM

## 2024-05-01 DIAGNOSIS — I2510 Atherosclerotic heart disease of native coronary artery without angina pectoris: Secondary | ICD-10-CM

## 2024-05-01 DIAGNOSIS — E559 Vitamin D deficiency, unspecified: Secondary | ICD-10-CM

## 2024-05-01 DIAGNOSIS — Z9889 Other specified postprocedural states: Secondary | ICD-10-CM | POA: Diagnosis not present

## 2024-05-01 DIAGNOSIS — G4700 Insomnia, unspecified: Secondary | ICD-10-CM | POA: Diagnosis not present

## 2024-05-01 DIAGNOSIS — F411 Generalized anxiety disorder: Secondary | ICD-10-CM

## 2024-05-01 MED ORDER — MUPIROCIN 2 % EX OINT: 1.0000 | TOPICAL_OINTMENT | Freq: Two times a day (BID) | CUTANEOUS | 0 refills | Status: DC

## 2024-05-01 MED ORDER — CLONAZEPAM 0.5 MG PO TABS: 0.5000 mg | ORAL_TABLET | Freq: Every day | ORAL | Status: DC | PRN

## 2024-05-01 MED ORDER — ZOLPIDEM TARTRATE 5 MG PO TABS: 2.5000 mg | ORAL_TABLET | Freq: Every evening | ORAL | 0 refills | Status: DC | PRN

## 2024-05-01 NOTE — Progress Notes (Signed)
 Established Patient Office Visit  Subjective   Patient ID: DETRELL UMSCHEID, male    DOB: January 25, 1956  Age: 68 y.o. MRN: [id number]  Chief Complaint  Patient presents with   Annual Exam    Discussed the use of AI scribe software for clinical note transcription with the patient, who gave verbal consent to proceed.  History of Present Illness Blease Capaldi is a 68 year old male who presents for a follow-up visit as his previous provider is getting ready to retire. Referred by Dr. Norleen for continued care.  Intentional weight loss - Intentional reduction in weight from 193 pounds in February to 162 pounds through daily walking of four miles and marked reduction of carbohydrates and sweets. - Remains very active with regular walking and outdoor activity.  Insomnia - For four months, sleep duration limited to two to three hours per night. - Trials of melatonin and other natural remedies have not improved sleep. Per chart review has also tried ambien  with good response and trazodone  with unknown response. Denies any negative side effects with ambien . Has not tolerated lunesta in the past. - Insomnia significantly worsened by family stress and increased anxiety.  Anxiety - Anxiety has worsened due to family stress. - Uses clonazepam  once or twice daily for anxiety symptoms.  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 - Completed a one-week course of doxycycline post-procedure      ROS: see HPI    Objective:     BP 132/74   Pulse 87   Temp 98 F (36.7 C) (Temporal)   Ht 5' 8.5 (1.74 m)   Wt 165 lb 4 oz (75 kg)   SpO2 97%   BMI 24.76 kg/m    Physical Exam Vitals reviewed.  Constitutional:      Appearance: Normal appearance.  HENT:     Head: Normocephalic and atraumatic.  Cardiovascular:     Rate and Rhythm: Normal rate and regular rhythm.  Pulmonary:     Effort: Pulmonary effort is normal.      Breath sounds: Normal breath sounds.  Musculoskeletal:     Cervical back: Neck supple.  Skin:    General: Skin is warm and dry.      Neurological:     Mental Status: He is alert and oriented to person, place, and time.  Psychiatric:        Mood and Affect: Mood normal.        Behavior: Behavior normal.        Thought Content: Thought content normal.        Judgment: Judgment normal.      No results found for any visits on 05/01/24.    The 10-year ASCVD risk score (Arnett DK, et al., 2019) is: 13.4%    Assessment & Plan:   Problem List Items Addressed This Visit       Cardiovascular and Mediastinum   Coronary artery calcification seen on CT scan - Primary   Relevant Orders   CBC   Comprehensive metabolic panel with GFR   Hemoglobin A1c   Lipid panel   TSH   VITAMIN D  25 Hydroxy (Vit-D Deficiency, Fractures)     Other   Anxiety state   Relevant Medications   clonazePAM  (KLONOPIN ) 0.5 MG tablet   Other Relevant Orders   CBC   Comprehensive metabolic panel with GFR   Hemoglobin A1c   Lipid panel   TSH   VITAMIN D   25 Hydroxy (Vit-D Deficiency, Fractures)   Insomnia   Relevant Medications   zolpidem  (AMBIEN ) 5 MG tablet   Other Relevant Orders   CBC   Comprehensive metabolic panel with GFR   Hemoglobin A1c   Lipid panel   TSH   VITAMIN D  25 Hydroxy (Vit-D Deficiency, Fractures)   Hyperlipidemia   Relevant Orders   CBC   Comprehensive metabolic panel with GFR   Hemoglobin A1c   Lipid panel   TSH   VITAMIN D  25 Hydroxy (Vit-D Deficiency, Fractures)   Mild vitamin D  deficiency   Relevant Orders   CBC   Comprehensive metabolic panel with GFR   Hemoglobin A1c   Lipid panel   TSH   VITAMIN D  25 Hydroxy (Vit-D Deficiency, Fractures)   S/P skin biopsy   Relevant Medications   mupirocin  ointment (BACTROBAN ) 2 %   Assessment and Plan Assessment & Plan 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  - Continue course of  doxycycline - Monitor excision site for infection signs. - Apply mupirocin  ointment twice a day if needed for infection prevention as well  Hyperlipidemia/CAD Significant weight loss and lifestyle changes noted. Re-evaluation needed. - Ordered fasting lipid panel. - Encouraged continuation of walking and dietary modifications.  Anxiety/Insomnia - risks vs benefits discussion completed today regarding treatment options. Per shared decision making patient will trial ambien  2.5mg  at bedtime as needed for insomnia. Due to potential for adverse effects is combined with clonazepam  patient will not take both ambien  and clonazepam  at the same time. If he takes his ambien  he will hold his evening dose of clonazepam .   Vitamin D  deficiency: -Check serum levels with next lab draw   Return in about 6 weeks (around 06/12/2024) for F/U with Lauraine.    Lauraine FORBES Pereyra, NP

## 2024-05-01 NOTE — Assessment & Plan Note (Signed)
 Hyperlipidemia/CAD Significant weight loss and lifestyle changes noted. Re-evaluation needed. - Ordered fasting lipid panel. - Encouraged continuation of walking and dietary modifications.

## 2024-05-01 NOTE — Assessment & Plan Note (Signed)
 Vitamin D  deficiency: -Check serum levels with next lab draw

## 2024-05-01 NOTE — Assessment & Plan Note (Signed)
 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  - Continue course of doxycycline - Monitor excision site for infection signs. - Apply mupirocin  ointment twice a day if needed for infection prevention as well

## 2024-05-01 NOTE — Assessment & Plan Note (Signed)
 Anxiety/Insomnia - risks vs benefits discussion completed today regarding treatment options. Per shared decision making patient will trial ambien  2.5mg  at bedtime as needed for insomnia. Due to potential for adverse effects is combined with clonazepam  patient will not take both ambien  and clonazepam  at the same time. If he takes his ambien  he will hold his evening dose of clonazepam .

## 2024-05-13 DIAGNOSIS — R079 Chest pain, unspecified: Secondary | ICD-10-CM | POA: Diagnosis not present

## 2024-05-14 ENCOUNTER — Telehealth: Payer: Self-pay

## 2024-05-14 NOTE — Transitions of Care (Post Inpatient/ED Visit) (Signed)
" ° °  05/14/2024  Name: Peter [name] MRN: [id number] DOB: 06-Oct-1955  Today's TOC FU Call Status: Today's TOC FU Call Status:: Successful TOC FU Call Completed TOC FU Call Complete Date: 05/14/24  Patient's Name and Date of Birth confirmed. Name, DOB  Transition Care Management Follow-up Telephone Call Date of Discharge: 05/13/24 Discharge Facility: Other ([facility]) Name of Other ([facility]) Discharge Facility: [facility] Type of Discharge: Inpatient Admission Primary Inpatient Discharge Diagnosis:: dizziness How have you been since you were released from the hospital?: Better Any questions or concerns?: No  Items Reviewed: Did you receive and understand the discharge instructions provided?: No Medications obtained,verified, and reconciled?: Yes (Medications Reviewed) Any new allergies since your discharge?: No Dietary orders reviewed?: NA Do you have support at home?: No  Medications Reviewed Today: Medications Reviewed Today     Reviewed by Emmitt Pan, LPN (Licensed Practical Nurse) on 05/14/24 at 1201  Med List Status: <None>   Medication Order Taking? Sig Documenting Provider Last Dose Status Informant  Cholecalciferol (VITAMIN D3) 50 MCG (2000 UT) TABS [id number] Yes Take by mouth. [provider]  Active   clonazePAM  (KLONOPIN ) 0.5 MG tablet [id number] Yes Take 1 tablet (0.5 mg total) by mouth daily as needed for anxiety. F41.1 Elnor Lauraine FORBES, NP  Active   co-enzyme Q-10 30 MG capsule [id number] Yes Take 30 mg by mouth 3 (three) times daily. [provider]  Active   Multiple Vitamin (MULTIVITAMIN WITH MINERALS) TABS tablet [id number] Yes Take 1 tablet by mouth daily. [provider]  Active Self  mupirocin  ointment (BACTROBAN ) 2 % [id number] Yes Apply 1 Application topically 2 (two) times daily. Elnor Lauraine FORBES, NP  Active   JESSE SCHLOSSMAN [id number] Yes Take 2 tablets by mouth daily. [provider]  Active   omeprazole  (PRILOSEC) 40  MG capsule [id number] Yes Take 1 capsule (40 mg total) by mouth daily. Norleen Lynwood ORN, MD  Active   rosuvastatin  (CRESTOR ) 20 MG tablet [id number] Yes Take 1 tablet (20 mg total) by mouth daily. Norleen Lynwood ORN, MD  Active   tadalafil  (CIALIS ) 20 MG tablet [id number] Yes Take 0.5-1 tablets (10-20 mg total) by mouth every other day as needed for erectile dysfunction. Norleen Lynwood ORN, MD  Active   Turmeric 500 MG CAPS [id number] Yes Take 1 tablet by mouth daily. [provider]  Active   zolpidem  (AMBIEN ) 5 MG tablet [id number] Yes Take 0.5 tablets (2.5 mg total) by mouth at bedtime as needed for sleep. Elnor Lauraine FORBES, NP  Active             Home Care and Equipment/Supplies: Were Home Health Services Ordered?: NA Any new equipment or medical supplies ordered?: NA  Functional Questionnaire: Do you need assistance with bathing/showering or dressing?: No Do you need assistance with meal preparation?: No Do you need assistance with eating?: No Do you have difficulty maintaining continence: No Do you need assistance with getting out of bed/getting out of a chair/moving?: No Do you have difficulty managing or taking your medications?: No  Follow up appointments reviewed: PCP Follow-up appointment confirmed?: No (no avail appt sent message to staff to schedule) MD Provider Line Number:[phone] Given: No Specialist Hospital Follow-up appointment confirmed?: NA Do you need transportation to your follow-up appointment?: No Do you understand care options if your condition(s) worsen?: Yes-patient verbalized understanding    SIGNATURE Pan Emmitt, LPN [facility] Nurse Health Advisor Direct Dial [phone]  "

## 2024-05-26 ENCOUNTER — Telehealth: Payer: Self-pay

## 2024-05-26 NOTE — Telephone Encounter (Signed)
 Copied from CRM #[id number]. Topic: Medical Record Request - Other >> May 26, 2024  1:07 PM Delon DASEN wrote: Reason for CRM: Patient asking if lab results were received from [facility] from ER visit and questions about other tests- [phone]

## 2024-05-30 NOTE — Telephone Encounter (Unsigned)
 Copied from CRM #[id number]. Topic: Clinical - Lab/Test Results >> May 30, 2024  1:12 PM Rachelle R wrote: Reason for CRM: Patient is calling to confirm that his labs from [facility] ER were received. States he has requested it multiple times to be sent to [facility] and wants to make sure she has everything from them before his appointment on 06/19/24.  Patient can be reached at [phone]

## 2024-06-15 ENCOUNTER — Encounter: Payer: Self-pay | Admitting: Nurse Practitioner

## 2024-06-16 ENCOUNTER — Other Ambulatory Visit: Payer: Self-pay | Admitting: Nurse Practitioner

## 2024-06-16 DIAGNOSIS — F411 Generalized anxiety disorder: Secondary | ICD-10-CM

## 2024-06-16 DIAGNOSIS — G4700 Insomnia, unspecified: Secondary | ICD-10-CM

## 2024-06-16 DIAGNOSIS — E7800 Pure hypercholesterolemia, unspecified: Secondary | ICD-10-CM

## 2024-06-16 DIAGNOSIS — Z0001 Encounter for general adult medical examination with abnormal findings: Secondary | ICD-10-CM

## 2024-06-19 ENCOUNTER — Other Ambulatory Visit: Payer: Self-pay | Admitting: Nurse Practitioner

## 2024-06-19 ENCOUNTER — Ambulatory Visit: Admitting: Nurse Practitioner

## 2024-06-19 ENCOUNTER — Other Ambulatory Visit

## 2024-06-19 ENCOUNTER — Ambulatory Visit: Payer: Self-pay | Admitting: Nurse Practitioner

## 2024-06-19 VITALS — BP 124/80 | HR 70 | Temp 98.1°F | Ht 68.5 in | Wt 171.2 lb

## 2024-06-19 DIAGNOSIS — R7989 Other specified abnormal findings of blood chemistry: Secondary | ICD-10-CM

## 2024-06-19 DIAGNOSIS — F411 Generalized anxiety disorder: Secondary | ICD-10-CM

## 2024-06-19 DIAGNOSIS — I2510 Atherosclerotic heart disease of native coronary artery without angina pectoris: Secondary | ICD-10-CM

## 2024-06-19 DIAGNOSIS — E7800 Pure hypercholesterolemia, unspecified: Secondary | ICD-10-CM

## 2024-06-19 DIAGNOSIS — E559 Vitamin D deficiency, unspecified: Secondary | ICD-10-CM

## 2024-06-19 DIAGNOSIS — G4700 Insomnia, unspecified: Secondary | ICD-10-CM

## 2024-06-19 DIAGNOSIS — R202 Paresthesia of skin: Secondary | ICD-10-CM | POA: Insufficient documentation

## 2024-06-19 DIAGNOSIS — Z0001 Encounter for general adult medical examination with abnormal findings: Secondary | ICD-10-CM

## 2024-06-19 DIAGNOSIS — K219 Gastro-esophageal reflux disease without esophagitis: Secondary | ICD-10-CM

## 2024-06-19 LAB — COMPREHENSIVE METABOLIC PANEL WITH GFR
ALT: 25 U/L (ref 3–53)
AST: 23 U/L (ref 5–37)
Albumin: 4.5 g/dL (ref 3.5–5.2)
Alkaline Phosphatase: 76 U/L (ref 39–117)
BUN: 15 mg/dL (ref 6–23)
CO2: 32 meq/L (ref 19–32)
Calcium: 9.3 mg/dL (ref 8.4–10.5)
Chloride: 100 meq/L (ref 96–112)
Creatinine, Ser: 0.79 mg/dL (ref 0.40–1.50)
GFR: 92.03 mL/min
Glucose, Bld: 100 mg/dL — ABNORMAL HIGH (ref 70–99)
Potassium: 3.4 meq/L — ABNORMAL LOW (ref 3.5–5.1)
Sodium: 139 meq/L (ref 135–145)
Total Bilirubin: 0.4 mg/dL (ref 0.2–1.2)
Total Protein: 7.2 g/dL (ref 6.0–8.3)

## 2024-06-19 LAB — LIPID PANEL
Cholesterol: 172 mg/dL (ref 28–200)
HDL: 59.3 mg/dL
LDL Cholesterol: 88 mg/dL (ref 10–99)
NonHDL: 112.61
Total CHOL/HDL Ratio: 3
Triglycerides: 121 mg/dL (ref 10.0–149.0)
VLDL: 24.2 mg/dL (ref 0.0–40.0)

## 2024-06-19 LAB — CBC
HCT: 43.9 % (ref 39.0–52.0)
Hemoglobin: 14.8 g/dL (ref 13.0–17.0)
MCHC: 33.6 g/dL (ref 30.0–36.0)
MCV: 96.7 fl (ref 78.0–100.0)
Platelets: 232 10*3/uL (ref 150.0–400.0)
RBC: 4.54 Mil/uL (ref 4.22–5.81)
RDW: 13.8 % (ref 11.5–15.5)
WBC: 6.8 10*3/uL (ref 4.0–10.5)

## 2024-06-19 LAB — TESTOSTERONE: Testosterone: 219.4 ng/dL — ABNORMAL LOW (ref 300.00–890.00)

## 2024-06-19 LAB — HEMOGLOBIN A1C: Hgb A1c MFr Bld: 5.7 % (ref 4.6–6.5)

## 2024-06-19 LAB — TSH: TSH: 3.2 u[IU]/mL (ref 0.35–5.50)

## 2024-06-19 LAB — VITAMIN D 25 HYDROXY (VIT D DEFICIENCY, FRACTURES): VITD: 41.54 ng/mL (ref 30.00–100.00)

## 2024-06-19 MED ORDER — OMEPRAZOLE 40 MG PO CPDR: 40.0000 mg | DELAYED_RELEASE_CAPSULE | Freq: Every day | ORAL | 3 refills | Status: AC

## 2024-06-19 MED ORDER — CLONAZEPAM 0.5 MG PO TABS: 0.5000 mg | ORAL_TABLET | Freq: Two times a day (BID) | ORAL | 0 refills | Status: AC | PRN

## 2024-06-19 MED ORDER — ROSUVASTATIN CALCIUM 20 MG PO TABS: 20.0000 mg | ORAL_TABLET | Freq: Every day | ORAL | 3 refills | Status: AC

## 2024-06-19 NOTE — Assessment & Plan Note (Signed)
 Paraesthesia - Patient feels this is caused by anxiety - No clinical signs of radiculopathy on exam today - Trial increase in clonazepam  dose for better anxiety control - Follow-up in 2 weeks as scheduled. If paraesthesia are still occurring consider imaging of C-spine.

## 2024-06-19 NOTE — Assessment & Plan Note (Signed)
" °  Gastroesophageal reflux disease Long-standing GERD managed with medication. - Continue current GERD medication regimen. - Monitor for symptom changes and adjust treatment as needed. "

## 2024-06-19 NOTE — Assessment & Plan Note (Signed)
 nsomnia Chronic insomnia with difficulty maintaining sleep. Previous sleep aids ineffective or caused adverse effects. Current sleep duration 3-4 hours. Discussed lifestyle modifications and sleep schedule adjustment. Ramelteon considered pending consultation. - Implement gradual sleep schedule adjustment. - Consult with Doctor Geofm regarding Ramelteon. - Avoid excessive caffeine and maintain consistent sleep routine.

## 2024-06-19 NOTE — Progress Notes (Signed)
 "  Established Patient Office Visit  Subjective   Patient ID: Peter [name], male    DOB: 1955-12-26  Age: 69 y.o. MRN: [id number]  Chief Complaint  Patient presents with   Annual Exam    Discussed the use of AI scribe software for clinical note transcription with the patient, who gave verbal consent to proceed.  History of Present Illness Peter [name] is a 69 year old male with anxiety and insomnia who presents for hospital discharge f/u.  He was admitted to [facility] 05/12/24-05/13/24.  He was experiencing chest pain and drove himself to a nearby fire station where they called EMS. In the ER workup identified stable VS ( HR 75, BP 123/82, O2 sats 100% on RA ), normal chemistry, LFTs normal, troponins negative, BNP normal, CBC normal, Chest xray negative for acute process, EKG NSR. He was admitted to rule out ACS.  No suspicion for PE or clinical evidence of DVT. He was monitored on telemetry and was without arrhythmias. 2D echo and lexiscan were normal.  It was thought GERD may be causing his symptoms.   Today he tells me that prior to his episode of chest he had taken his ambien  at bedtime and then about 12-13 hours later took a dose of his clonazepam . Shortly after his symptoms started and he feels the episode may have been precipitated by potential interaction between the ambien  and clonazepam . Thus, he has discontinued the ambien .   He has not had any reoccurrence of chest pain but does feel numbness and tingling down his arms that comes and goes. He does report history of playing sports in his youth and has worked with chiropractor in the past for pain in his back. Denies any known recent trauma to C-spine/back. Denies weakness in upper extremities.   Continues to struggle with anxiety and insomnia. Reports 2-4 hours of sleep/night. Reports history of trying antidepressants (he states he has tried 4 different antidepressants in the past) which caused worsening mood  and SI. He is currently taking clonazepam  0.5mg /day PRN and is wondering if dose can be increased to 0.5mg  BID PRN. He is experiencing increased situational stress r/t to family health issues. He has tried multiple sleep aids including melatonin, trazodone , and ambien .        Review of Systems  Respiratory:  Negative for shortness of breath.   Cardiovascular:  Negative for chest pain and palpitations.  Psychiatric/Behavioral:  The patient is nervous/anxious and has insomnia.       Objective:     BP 124/80   Pulse 70   Temp 98.1 F (36.7 C) (Temporal)   Ht 5' 8.5 (1.74 m)   Wt 171 lb 4 oz (77.7 kg)   SpO2 97%   BMI 25.66 kg/m  BP Readings from Last 3 Encounters:  06/19/24 124/80  05/01/24 132/74  06/21/23 126/82   Wt Readings from Last 3 Encounters:  06/19/24 171 lb 4 oz (77.7 kg)  05/01/24 165 lb 4 oz (75 kg)  08/23/23 183 lb (83 kg)      Physical Exam Vitals reviewed.  Constitutional:      Appearance: Normal appearance.  HENT:     Head: Normocephalic and atraumatic.  Cardiovascular:     Rate and Rhythm: Normal rate and regular rhythm.  Pulmonary:     Effort: Pulmonary effort is normal.     Breath sounds: Normal breath sounds.  Musculoskeletal:     Cervical back: Neck supple.  Skin:    General:  Skin is warm and dry.  Neurological:     Mental Status: He is alert and oriented to person, place, and time.     Cranial Nerves: Cranial nerves 2-12 are intact.     Sensory: Sensation is intact.     Motor: Motor function is intact.  Psychiatric:        Mood and Affect: Mood normal.        Behavior: Behavior normal.        Thought Content: Thought content normal.        Judgment: Judgment normal.      No results found for any visits on 06/19/24.    The 10-year ASCVD risk score (Arnett DK, et al., 2019) is: 12.1%    Assessment & Plan:   Problem List Items Addressed This Visit       Digestive   GERD (gastroesophageal reflux disease)     Gastroesophageal reflux disease Long-standing GERD managed with medication. - Continue current GERD medication regimen. - Monitor for symptom changes and adjust treatment as needed.      Relevant Medications   omeprazole  (PRILOSEC) 40 MG capsule     Other   Anxiety state   Generalized anxiety disorder Chronic anxiety exacerbated by stressors and hospitalization. Clonazepam  effective but patient requesting increased dosing. Antidepressants worsened symptoms in the past.  - We discussed risks of long-term use of clonazepam  and patient is aware. For now I am agreeable to increase of clonazepam  to 0.5 mg twice daily as needed.  - Consider psychiatry referral if clonazepam  dose needs further increase. - Monitor anxiety symptoms and adjust treatment as needed.      Relevant Medications   clonazePAM  (KLONOPIN ) 0.5 MG tablet   Insomnia - Primary   nsomnia Chronic insomnia with difficulty maintaining sleep. Previous sleep aids ineffective or caused adverse effects. Current sleep duration 3-4 hours. Discussed lifestyle modifications and sleep schedule adjustment. Ramelteon considered pending consultation. - Implement gradual sleep schedule adjustment. - Consult with Doctor Geofm regarding Ramelteon. - Avoid excessive caffeine and maintain consistent sleep routine.      Hyperlipidemia   Pure hypercholesterolemia Genetic hypercholesterolemia managed with statins. Recent cardiac evaluation normal. Statins prevent plaque formation. - Continue current statin therapy. - Monitor cholesterol levels and adjust treatment as needed.      Relevant Medications   rosuvastatin  (CRESTOR ) 20 MG tablet   Paresthesia   Paraesthesia - Patient feels this is caused by anxiety - No clinical signs of radiculopathy on exam today - Trial increase in clonazepam  dose for better anxiety control - Follow-up in 2 weeks as scheduled. If paraesthesia are still occurring consider imaging of C-spine.       Assessment and Plan Assessment & Plan Generalized anxiety disorder Chronic anxiety exacerbated by stressors and hospitalization. Clonazepam  effective but patient requesting increased dosing. Antidepressants worsened symptoms in the past.  - We discussed risks of long-term use of clonazepam  and patient is aware. For now I am agreeable to increase of clonazepam  to 0.5 mg twice daily as needed.  - Consider psychiatry referral if clonazepam  dose needs further increase. - Monitor anxiety symptoms and adjust treatment as needed.  Insomnia Chronic insomnia with difficulty maintaining sleep. Previous sleep aids ineffective or caused adverse effects. Current sleep duration 3-4 hours. Discussed lifestyle modifications and sleep schedule adjustment. Ramelteon considered pending consultation. - Implement gradual sleep schedule adjustment. - Consult with Doctor Geofm regarding Ramelteon. - Avoid excessive caffeine and maintain consistent sleep routine.  Pure hypercholesterolemia Genetic hypercholesterolemia managed with statins. Recent  cardiac evaluation normal. Statins prevent plaque formation. - Continue current statin therapy. - Monitor cholesterol levels and adjust treatment as needed.  Gastroesophageal reflux disease Long-standing GERD managed with medication. - Continue current GERD medication regimen. - Monitor for symptom changes and adjust treatment as needed.  Paraesthesia - Patient feels this is caused by anxiety - No clinical signs of radiculopathy on exam today - Trial increase in clonazepam  dose for better anxiety control - Follow-up in 2 weeks as scheduled. If paraesthesia are still occurring consider imaging of C-spine.     Return for as scheduled.    Lauraine FORBES Pereyra, NP  "

## 2024-06-19 NOTE — Assessment & Plan Note (Signed)
 Pure hypercholesterolemia Genetic hypercholesterolemia managed with statins. Recent cardiac evaluation normal. Statins prevent plaque formation. - Continue current statin therapy. - Monitor cholesterol levels and adjust treatment as needed.

## 2024-06-19 NOTE — Assessment & Plan Note (Signed)
 Generalized anxiety disorder Chronic anxiety exacerbated by stressors and hospitalization. Clonazepam  effective but patient requesting increased dosing. Antidepressants worsened symptoms in the past.  - We discussed risks of long-term use of clonazepam  and patient is aware. For now I am agreeable to increase of clonazepam  to 0.5 mg twice daily as needed.  - Consider psychiatry referral if clonazepam  dose needs further increase. - Monitor anxiety symptoms and adjust treatment as needed.

## 2024-06-23 ENCOUNTER — Encounter: Payer: Medicare HMO | Admitting: Internal Medicine

## 2024-06-27 ENCOUNTER — Encounter: Admitting: Nurse Practitioner

## 2024-07-10 ENCOUNTER — Encounter: Admitting: Nurse Practitioner

## 2024-08-26 ENCOUNTER — Ambulatory Visit
